# Patient Record
Sex: Female | Born: 1960 | Race: White | Hispanic: No | Marital: Single | Smoking: Current every day smoker
Health system: Southern US, Community
[De-identification: ages and names within clinical notes are randomized; demographics above are authoritative.]

## PROBLEM LIST (undated history)

## (undated) DIAGNOSIS — M5136 Other intervertebral disc degeneration, lumbar region: Secondary | ICD-10-CM

## (undated) DIAGNOSIS — M160 Bilateral primary osteoarthritis of hip: Secondary | ICD-10-CM

## (undated) DIAGNOSIS — I607 Nontraumatic subarachnoid hemorrhage from unspecified intracranial artery: Secondary | ICD-10-CM

## (undated) DIAGNOSIS — I10 Essential (primary) hypertension: Secondary | ICD-10-CM

## (undated) DIAGNOSIS — Z87891 Personal history of nicotine dependence: Secondary | ICD-10-CM

## (undated) DIAGNOSIS — H532 Diplopia: Secondary | ICD-10-CM

## (undated) DIAGNOSIS — E785 Hyperlipidemia, unspecified: Secondary | ICD-10-CM

## (undated) DIAGNOSIS — I671 Cerebral aneurysm, nonruptured: Secondary | ICD-10-CM

## (undated) DIAGNOSIS — J449 Chronic obstructive pulmonary disease, unspecified: Secondary | ICD-10-CM

## (undated) DIAGNOSIS — H21239 Degeneration of iris (pigmentary), unspecified eye: Secondary | ICD-10-CM

## (undated) HISTORY — PX: BRAIN SURGERY: SHX531

## (undated) HISTORY — DX: Nontraumatic subarachnoid hemorrhage from unspecified intracranial artery: I60.7

## (undated) HISTORY — DX: Chronic obstructive pulmonary disease, unspecified: J44.9

## (undated) HISTORY — DX: Personal history of nicotine dependence: Z87.891

## (undated) HISTORY — DX: Hyperlipidemia, unspecified: E78.5

## (undated) HISTORY — DX: Other intervertebral disc degeneration, lumbar region: M51.36

## (undated) HISTORY — PX: KIDNEY SURGERY: SHX687

## (undated) HISTORY — DX: Diplopia: H53.2

## (undated) HISTORY — DX: Bilateral primary osteoarthritis of hip: M16.0

## (undated) HISTORY — DX: Cerebral aneurysm, nonruptured: I67.1

## (undated) HISTORY — DX: Degeneration of iris (pigmentary), unspecified eye: H21.239

---

## 2009-04-16 ENCOUNTER — Ambulatory Visit: Payer: Self-pay | Admitting: Family Medicine

## 2009-04-16 DIAGNOSIS — N644 Mastodynia: Secondary | ICD-10-CM

## 2009-04-16 LAB — CONVERTED CEMR LAB
Basophils Absolute: 0 10*3/uL (ref 0.0–0.1)
Basophils Relative: 0 % (ref 0–1)
Eosinophils Relative: 0 % (ref 0–5)
Hemoglobin: 14 g/dL (ref 12.0–15.0)
MCHC: 35 g/dL (ref 30.0–36.0)
Monocytes Absolute: 0.5 10*3/uL (ref 0.1–1.0)
Neutro Abs: 5.8 10*3/uL (ref 1.7–7.7)
Platelets: 296 10*3/uL (ref 150–400)
RDW: 12.4 % (ref 11.5–15.5)

## 2009-04-17 ENCOUNTER — Encounter: Payer: Self-pay | Admitting: Family Medicine

## 2009-04-18 ENCOUNTER — Encounter: Admission: RE | Admit: 2009-04-18 | Discharge: 2009-04-18 | Payer: Self-pay | Admitting: Family Medicine

## 2009-04-19 LAB — CONVERTED CEMR LAB
CO2: 21 meq/L (ref 19–32)
Creatinine, Ser: 0.74 mg/dL (ref 0.40–1.20)
Glucose, Bld: 92 mg/dL (ref 70–99)
Sed Rate: 2 mm/hr (ref 0–22)
Total Bilirubin: 0.4 mg/dL (ref 0.3–1.2)

## 2014-08-13 ENCOUNTER — Encounter (HOSPITAL_COMMUNITY): Payer: Self-pay

## 2014-08-13 ENCOUNTER — Inpatient Hospital Stay (HOSPITAL_COMMUNITY)
Admission: EM | Admit: 2014-08-13 | Discharge: 2014-08-24 | DRG: 020 | Disposition: A | Discharge status: Rehabilitation Facility | Payer: Managed Care, Other (non HMO) | Attending: Neurosurgery | Admitting: Neurosurgery

## 2014-08-13 ENCOUNTER — Emergency Department (HOSPITAL_COMMUNITY): Payer: Managed Care, Other (non HMO)

## 2014-08-13 ENCOUNTER — Inpatient Hospital Stay (HOSPITAL_COMMUNITY): Payer: Managed Care, Other (non HMO)

## 2014-08-13 DIAGNOSIS — R0902 Hypoxemia: Secondary | ICD-10-CM | POA: Diagnosis present

## 2014-08-13 DIAGNOSIS — G9340 Encephalopathy, unspecified: Secondary | ICD-10-CM

## 2014-08-13 DIAGNOSIS — R519 Headache, unspecified: Secondary | ICD-10-CM

## 2014-08-13 DIAGNOSIS — I6359 Cerebral infarction due to unspecified occlusion or stenosis of other cerebral artery: Secondary | ICD-10-CM | POA: Diagnosis not present

## 2014-08-13 DIAGNOSIS — S066X0A Traumatic subarachnoid hemorrhage without loss of consciousness, initial encounter: Secondary | ICD-10-CM

## 2014-08-13 DIAGNOSIS — I609 Nontraumatic subarachnoid hemorrhage, unspecified: Secondary | ICD-10-CM | POA: Diagnosis present

## 2014-08-13 DIAGNOSIS — J811 Chronic pulmonary edema: Secondary | ICD-10-CM | POA: Insufficient documentation

## 2014-08-13 DIAGNOSIS — R51 Headache: Secondary | ICD-10-CM

## 2014-08-13 DIAGNOSIS — G910 Communicating hydrocephalus: Secondary | ICD-10-CM

## 2014-08-13 DIAGNOSIS — I607 Nontraumatic subarachnoid hemorrhage from unspecified intracranial artery: Secondary | ICD-10-CM | POA: Diagnosis present

## 2014-08-13 DIAGNOSIS — K5900 Constipation, unspecified: Secondary | ICD-10-CM | POA: Diagnosis not present

## 2014-08-13 DIAGNOSIS — E872 Acidosis: Secondary | ICD-10-CM | POA: Diagnosis not present

## 2014-08-13 DIAGNOSIS — I608 Other nontraumatic subarachnoid hemorrhage: Secondary | ICD-10-CM

## 2014-08-13 DIAGNOSIS — F17210 Nicotine dependence, cigarettes, uncomplicated: Secondary | ICD-10-CM | POA: Diagnosis present

## 2014-08-13 DIAGNOSIS — I10 Essential (primary) hypertension: Secondary | ICD-10-CM | POA: Diagnosis present

## 2014-08-13 DIAGNOSIS — Z8679 Personal history of other diseases of the circulatory system: Secondary | ICD-10-CM | POA: Diagnosis present

## 2014-08-13 DIAGNOSIS — H4921 Sixth [abducent] nerve palsy, right eye: Secondary | ICD-10-CM | POA: Diagnosis present

## 2014-08-13 DIAGNOSIS — E871 Hypo-osmolality and hyponatremia: Secondary | ICD-10-CM | POA: Diagnosis not present

## 2014-08-13 DIAGNOSIS — R451 Restlessness and agitation: Secondary | ICD-10-CM | POA: Diagnosis not present

## 2014-08-13 DIAGNOSIS — R0602 Shortness of breath: Secondary | ICD-10-CM

## 2014-08-13 DIAGNOSIS — J9601 Acute respiratory failure with hypoxia: Secondary | ICD-10-CM | POA: Diagnosis not present

## 2014-08-13 HISTORY — DX: Essential (primary) hypertension: I10

## 2014-08-13 LAB — COMPREHENSIVE METABOLIC PANEL
ALBUMIN: 3.8 g/dL (ref 3.5–5.2)
ALT: 17 U/L (ref 0–35)
ANION GAP: 17 — AB (ref 5–15)
AST: 31 U/L (ref 0–37)
Alkaline Phosphatase: 121 U/L — ABNORMAL HIGH (ref 39–117)
BUN: 8 mg/dL (ref 6–23)
CALCIUM: 8.9 mg/dL (ref 8.4–10.5)
CHLORIDE: 100 meq/L (ref 96–112)
CO2: 24 mEq/L (ref 19–32)
CREATININE: 0.63 mg/dL (ref 0.50–1.10)
GFR calc Af Amer: >90 mL/min (ref >90)
GFR calc non Af Amer: >90 mL/min (ref >90)
Glucose, Bld: 173 mg/dL — ABNORMAL HIGH (ref 70–99)
Potassium: 3.1 mEq/L — ABNORMAL LOW (ref 3.7–5.3)
Sodium: 141 mEq/L (ref 137–147)
TOTAL PROTEIN: 6.8 g/dL (ref 6.0–8.3)
Total Bilirubin: 0.3 mg/dL (ref 0.3–1.2)

## 2014-08-13 LAB — CBC WITH DIFFERENTIAL/PLATELET
BASOS ABS: 0 10*3/uL (ref 0.0–0.1)
BASOS PCT: 0 % (ref 0–1)
Eosinophils Absolute: 0 10*3/uL (ref 0.0–0.7)
Eosinophils Relative: 0 % (ref 0–5)
HEMATOCRIT: 38.7 % (ref 36.0–46.0)
HEMOGLOBIN: 13.2 g/dL (ref 12.0–15.0)
LYMPHS PCT: 25 % (ref 12–46)
Lymphs Abs: 2.2 10*3/uL (ref 0.7–4.0)
MCH: 34.9 pg — ABNORMAL HIGH (ref 26.0–34.0)
MCHC: 34.1 g/dL (ref 30.0–36.0)
MCV: 102.4 fL — ABNORMAL HIGH (ref 78.0–100.0)
MONO ABS: 0.4 10*3/uL (ref 0.1–1.0)
MONOS PCT: 4 % (ref 3–12)
NEUTROS ABS: 6.1 10*3/uL (ref 1.7–7.7)
NEUTROS PCT: 71 % (ref 43–77)
Platelets: 307 10*3/uL (ref 150–400)
RBC: 3.78 MIL/uL — AB (ref 3.87–5.11)
RDW: 13 % (ref 11.5–15.5)
WBC: 8.7 10*3/uL (ref 4.0–10.5)

## 2014-08-13 LAB — TROPONIN I

## 2014-08-13 LAB — APTT: aPTT: 23 seconds — ABNORMAL LOW (ref 24–37)

## 2014-08-13 LAB — TYPE AND SCREEN
ABO/RH(D): O POS
ANTIBODY SCREEN: NEGATIVE

## 2014-08-13 LAB — MRSA PCR SCREENING: MRSA by PCR: NEGATIVE

## 2014-08-13 LAB — ABO/RH: ABO/RH(D): O POS

## 2014-08-13 LAB — PROTIME-INR
INR: 1 (ref 0.00–1.49)
Prothrombin Time: 13.3 seconds (ref 11.6–15.2)

## 2014-08-13 MED ORDER — POTASSIUM CHLORIDE IN NACL 40-0.9 MEQ/L-% IV SOLN
INTRAVENOUS | Status: DC
  Administered 2014-08-13 – 2014-08-16 (×5): 125 mL/h via INTRAVENOUS
  Filled 2014-08-13 (×15): qty 1000

## 2014-08-13 MED ORDER — ONDANSETRON HCL 4 MG/2ML IJ SOLN
4.0000 mg | Freq: Once | INTRAMUSCULAR | Status: AC
  Administered 2014-08-13: 4 mg via INTRAVENOUS
  Filled 2014-08-13: qty 2

## 2014-08-13 MED ORDER — PANTOPRAZOLE SODIUM 40 MG IV SOLR
40.0000 mg | Freq: Every day | INTRAVENOUS | Status: DC
  Administered 2014-08-13 – 2014-08-14 (×2): 40 mg via INTRAVENOUS
  Filled 2014-08-13 (×4): qty 40

## 2014-08-13 MED ORDER — ATORVASTATIN CALCIUM 40 MG PO TABS
40.0000 mg | ORAL_TABLET | Freq: Every day | ORAL | Status: DC
  Administered 2014-08-13 – 2014-08-23 (×11): 40 mg via ORAL
  Filled 2014-08-13 (×13): qty 1

## 2014-08-13 MED ORDER — NIMODIPINE 60 MG/20ML PO SOLN
60.0000 mg | ORAL | Status: DC
  Administered 2014-08-13: 60 mg
  Filled 2014-08-13 (×36): qty 20

## 2014-08-13 MED ORDER — SODIUM CHLORIDE 0.9 % IV SOLN
INTRAVENOUS | Status: DC
  Administered 2014-08-13: 1000 mL via INTRAVENOUS
  Administered 2014-08-14 – 2014-08-19 (×6): via INTRAVENOUS
  Administered 2014-08-20: 100 mL/h via INTRAVENOUS
  Administered 2014-08-21 – 2014-08-23 (×4): via INTRAVENOUS
  Administered 2014-08-24: 100 mL/h via INTRAVENOUS

## 2014-08-13 MED ORDER — ONDANSETRON HCL 4 MG/2ML IJ SOLN
4.0000 mg | Freq: Four times a day (QID) | INTRAMUSCULAR | Status: DC | PRN
  Administered 2014-08-13: 4 mg via INTRAVENOUS
  Administered 2014-08-16: 8 mg via INTRAVENOUS
  Administered 2014-08-17: 4 mg via INTRAVENOUS
  Filled 2014-08-13: qty 4
  Filled 2014-08-13 (×2): qty 2
  Filled 2014-08-13: qty 4

## 2014-08-13 MED ORDER — FENTANYL CITRATE 0.05 MG/ML IJ SOLN
100.0000 ug | Freq: Once | INTRAMUSCULAR | Status: AC
Start: 2014-08-13 — End: 2014-08-13
  Administered 2014-08-13: 100 ug via INTRAVENOUS
  Filled 2014-08-13: qty 2

## 2014-08-13 MED ORDER — FENTANYL CITRATE 0.05 MG/ML IJ SOLN
50.0000 ug | Freq: Once | INTRAMUSCULAR | Status: AC
  Administered 2014-08-13: 50 ug via INTRAVENOUS
  Filled 2014-08-13: qty 2

## 2014-08-13 MED ORDER — HYDRALAZINE HCL 20 MG/ML IJ SOLN
5.0000 mg | INTRAMUSCULAR | Status: DC | PRN
  Administered 2014-08-13 – 2014-08-20 (×17): 10 mg via INTRAVENOUS
  Filled 2014-08-13 (×18): qty 1

## 2014-08-13 MED ORDER — IOHEXOL 350 MG/ML SOLN
50.0000 mL | Freq: Once | INTRAVENOUS | Status: AC | PRN
  Administered 2014-08-13: 50 mL via INTRAVENOUS

## 2014-08-13 MED ORDER — DEXAMETHASONE SODIUM PHOSPHATE 4 MG/ML IJ SOLN
4.0000 mg | Freq: Four times a day (QID) | INTRAMUSCULAR | Status: DC
  Administered 2014-08-13 – 2014-08-22 (×36): 4 mg via INTRAVENOUS
  Filled 2014-08-13 (×40): qty 1

## 2014-08-13 MED ORDER — NIMODIPINE 30 MG PO CAPS
60.0000 mg | ORAL_CAPSULE | ORAL | Status: DC
  Administered 2014-08-13 – 2014-08-18 (×26): 60 mg via ORAL
  Filled 2014-08-13 (×36): qty 2

## 2014-08-13 MED ORDER — ACETAMINOPHEN 650 MG RE SUPP: 650.0000 mg | RECTAL | Status: DC | PRN

## 2014-08-13 MED ORDER — ACETAMINOPHEN 325 MG PO TABS
650.0000 mg | ORAL_TABLET | ORAL | Status: DC | PRN
  Administered 2014-08-15 – 2014-08-16 (×2): 650 mg via ORAL
  Filled 2014-08-13 (×2): qty 2

## 2014-08-13 MED ORDER — MORPHINE SULFATE 4 MG/ML IJ SOLN
4.0000 mg | Freq: Once | INTRAMUSCULAR | Status: AC
  Administered 2014-08-13: 4 mg via INTRAVENOUS
  Filled 2014-08-13: qty 1

## 2014-08-13 MED ORDER — SIMVASTATIN 80 MG PO TABS
80.0000 mg | ORAL_TABLET | Freq: Every day | ORAL | Status: DC
Start: 2014-08-13 — End: 2014-08-13

## 2014-08-13 MED ORDER — MORPHINE SULFATE 2 MG/ML IJ SOLN
1.0000 mg | INTRAMUSCULAR | Status: DC | PRN
  Administered 2014-08-13 – 2014-08-15 (×11): 2 mg via INTRAVENOUS
  Administered 2014-08-15: 3 mg via INTRAVENOUS
  Administered 2014-08-15: 2 mg via INTRAVENOUS
  Administered 2014-08-16: 1 mg via INTRAVENOUS
  Administered 2014-08-17: 4 mg via INTRAVENOUS
  Administered 2014-08-21: 1 mg via INTRAVENOUS
  Administered 2014-08-21 – 2014-08-24 (×9): 2 mg via INTRAVENOUS
  Filled 2014-08-13 (×3): qty 1
  Filled 2014-08-13: qty 2
  Filled 2014-08-13 (×9): qty 1
  Filled 2014-08-13: qty 2
  Filled 2014-08-13 (×11): qty 1

## 2014-08-13 MED ORDER — SENNOSIDES-DOCUSATE SODIUM 8.6-50 MG PO TABS
1.0000 | ORAL_TABLET | Freq: Two times a day (BID) | ORAL | Status: DC
  Administered 2014-08-13 – 2014-08-17 (×5): 1 via ORAL
  Filled 2014-08-13 (×14): qty 1

## 2014-08-13 MED ORDER — LABETALOL HCL 5 MG/ML IV SOLN
5.0000 mg | INTRAVENOUS | Status: DC | PRN
  Administered 2014-08-13: 20 mg via INTRAVENOUS
  Administered 2014-08-15 – 2014-08-18 (×3): 10 mg via INTRAVENOUS
  Filled 2014-08-13 (×3): qty 4

## 2014-08-13 NOTE — Progress Notes (Signed)
Transcranial Doppler  Date POD PCO2 HCT BP  MCA ACA PCA OPHT SIPH VERT Basilar  08/13/14 JE     Right  Left   47 56 -70     38  34   20  22   58  23   -41  -20   -46           Right  Left                                            Right  Left                                             Right  Left                                             Right  Left                                            Right  Left                                            Right  Left                                        MCA = Middle Cerebral Artery      OPHT = Opthalmic Artery     BASILAR = Basilar Artery   ACA = Anterior Cerebral Artery     SIPH = Carotid Siphon PCA = Posterior Cerebral Artery   VERT = Verterbral Artery                   Normal MCA = 62+\-12 ACA = 50+\-12 PCA = 42+\-23

## 2014-08-13 NOTE — Progress Notes (Signed)
Pt seen and examined. History reviewed. Pt has hx of hypertension and smoking history. Also has strong family hx of intracranial aneurysms in father and sister.  EXAM:  BP 124/59 mmHg  Pulse 74  Temp(Src) 97.9 F (36.6 C) (Oral)  Resp 13  Ht 5\' 4"  (1.626 m)  Wt 58.8 kg (129 lb 10.1 oz)  BMI 22.24 kg/m2  SpO2 98%  Sleepy but easily arousable, oriented  Speech fluent, appropriate  CN grossly intact  5/5 BUE/BLE   IMAGING: CTH demonstrates diffuse basal SAH with minimal dependent IVH in occipital horns and no ventriculomegaly.  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 No other aneurysms are identified.  IMPRESSION:  53 y.o. 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 I explained to them that intracranial aneurysm was the most common non[facility]traumatic [facility] and that the definitive diagnosis is made by diagnostic angiogram. I also explained to them the possible treatment options for intracranial aneurysms including endovascular coiling and open clip ligation. The risks of the angiogram, coiling, and surgical clipping were also reviewed to include stroke and aneurysm re-rupture leading to weakness/paralysis/coma/death, infection, SZ, hydrocephalus. I also talked to them about the possibility of ventriculostomy for CSF drainage and the risks of this procedure.  The patient and her family understood our discussion and the provided consent to proceed with diagnostic angiogram and the appropriate treatment for any identified aneurysm.

## 2014-08-13 NOTE — H&P (Signed)
Subjective: Patient is a 53 y.o. female who is admitted for treatment of acute subarachnoid hemorrhage.  Patient has a history of hypertension, but does not take the medications prescribed for her. Patient was at work, talking to her coworkers, and developed sudden onset of severe headache and neck pain. [facility], and evaluated by Dr. Arby BarretteMarcy Pfeiffer, the EDP, who obtained a CT of the head which revealed a subarachnoid hemorrhage, and consulted neurosurgery.  Patient is complaining of severe headache and neck pain, and has been treated with repeated doses of morphine IV. She's also had nausea, has been treated with Zofran IV.  History is notable for her father having died of a ruptured cerebral aneurysm, and her sister having an aneurysm.  Past medical history: Notable for history of hypertension. She has been prescribed medication, but does not take it (she does not recall the name of the medication). She denies any history of myocardial infarction, cancer, stroke, peptic ulcers, lung disease, or cancer.  Past surgical history:  2 cesarean sections.   Patient Active Problem List   Diagnosis Date Noted  . Subarachnoid hemorrhage due to ruptured aneurysm 08/13/2014  . BREAST PAIN, LEFT 04/16/2009     (Not in a hospital admission) Allergies  Allergen Reactions  . Aspirin   . Ibuprofen   . Iodine   . Lobster [employer][Shellfish Allergy]     Family history:  Father died of a ruptured cerebral aneurysm, mother died of cancer, sister has a history of an aneurysm.  Social history: Patient is divorced, she's had a boyfriend, Jonny RuizJohn Dinardi, for many years, who was present at the bedside. She has 2 daughters. She smokes a pack a day and has been smoking for 35 years. She drinks alcohol beverages daily.  Review of Systems A comprehensive review of systems was negative except for: Those difficulties described in her history of present illness and past medical  history.  Objective: Vital signs in last 24 hours: Temp:  [94.5 F (34.7 C)-95 F (35 C)] 94.5 F (34.7 C) (12/07 1000) Pulse Rate:  [81-95] 81 (12/07 1030) Resp:  [11-23] 15 (12/07 1030) BP: (169-182)/(80-117) 182/87 mmHg (12/07 1030) SpO2:  [94 %-99 %] 99 % (12/07 1030) Weight:  [56.7 kg (125 lb)] 56.7 kg (125 lb) (12/07 0853)  EXAM: Patient is a well-developed, well-nourishe neck has d, white female in discomfort (both headache and neck pain), but who is in no acute distress. Lungs are clear to auscultation , the patient has symmetrical respiratory excursion. Heart has a regular rate and rhythm normal S1 and S2 no murmur.   Abdomen is soft nontender nondistended bowel sounds are present. Extremity examination shows no clubbing cyanosis or edema. 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 Mental status examination:  Patient is mildly drowsy, but easily aroused. She is oriented to her name, [facility]. She follows commands with all 4 extremities. Her speech is fluent. Cranial nerves examination: Pupils are 2.5 mm bilaterally, round, sluggishly reactive to light.  EOMI. Facial sensation intact. Facial movements symmetrical. Hearing present bilaterally. Palatal movements symmetrical. Shoulder shrug symmetrical. Tongue midline. Motor examination: Moving all 4 extremities with 5/5 strength. No drift of the upper extremities. Sensory examination: Intact to pinprick throughout the upper and lower extremities. Reflex examination: Symmetrical bilaterally in the upper and lower extremities. Toes downgoing bilaterally. Gait and stance: Not tested due to the nature the patient's condition.  Data Review:CBC    Component Value Date/Time  WBC 8.7 08/13/2014 0915   RBC 3.78* 08/13/2014 0915   HGB 13.2 08/13/2014 0915   HCT 38.7 08/13/2014 0915   PLT 307 08/13/2014 0915   MCV 102.4* 08/13/2014 0915   MCH 34.9* 08/13/2014 0915   MCHC 34.1 08/13/2014 0915   RDW 13.0  08/13/2014 0915   LYMPHSABS 2.2 08/13/2014 0915   MONOABS 0.4 08/13/2014 0915   EOSABS 0.0 08/13/2014 0915   BASOSABS 0.0 08/13/2014 0915                          BMET    Component Value Date/Time   NA 141 08/13/2014 0915   K 3.1* 08/13/2014 0915   CL 100 08/13/2014 0915   CO2 24 08/13/2014 0915   GLUCOSE 173* 08/13/2014 0915   BUN 8 08/13/2014 0915   CREATININE 0.63 08/13/2014 0915   CALCIUM 8.9 08/13/2014 0915   GFRNONAA >90 08/13/2014 0915   GFRAA >90 08/13/2014 0915     Assessment/Plan: Patient who presented to the emergency room today with an acute subarachnoid hemorrhage, probably aneurysmal. Currently grade 2-3.  Case discussed with Dr. Lisbeth RenshawNeelesh Nundkumar, our vascular and endovascular specialist.  We will admit patient to his service, and have initiated supportive IV fluids, Nimotop, Zocor, TCDs, and have requested a CT angiogram. Dr. Conchita ParisNundkumar will see the patient later today, and anticipates proceeding with cerebral angiography tomorrow morning. I spoke with the patient's boyfriend, Jonny RuizJohn Dinardi, and discussed the nature of aneurysmal subarachnoid hemorrhage, the risk of rehemorrhage from the aneurysm with the risk of increased morbidity and mortality, the risk of vasospasm and increased neurologic deficit, and the risk of hydrocephalus and possibly for placement of an intraventricular catheter. His questions regarding the situation were answered for him.   Hewitt ShortsNUDELMAN,ROBERT W, MD 08/13/2014 10:43 AM

## 2014-08-13 NOTE — ED Provider Notes (Signed)
CSN: [id number]     Arrival date & time 08/13/14  0844 History   First MD Initiated Contact with Patient 08/13/14 [phone]     Chief Complaint  Patient presents with  . Headache     (Consider location/radiation/quality/duration/timing/severity/associated sxs/prior Treatment) HPI The patient had a sudden onset of severe headache prior to arrival. She was at work and speaking with someone when she had sudden severe 10 out of 10 pain in the back of her head. She did not have a loss of consciousness. Coworkers eased her to a seated position. There was no injury associated with this episode. The patient reports she is very nauseated. She has never had similar type headache. She reports some distant migraine headache history but nothing similar to this. The patient was well this morning she got up her for work drove herself to work without symptoms. Past Medical History  Diagnosis Date  . Hypertension    Past Surgical History  Procedure Laterality Date  . Cessarian N/A   . Radiology with anesthesia N/A 08/14/2014    Procedure: Embolization   (RADIOLOGY WITH ANESTHESIA) ;  Surgeon: Medication Radiologist, MD;  Location: [facility] OR;  Service: Radiology;  Laterality: N/A;   History reviewed. No pertinent family history. History  Substance Use Topics  . Smoking status: Current Every Day Smoker -- 1.00 packs/day  . Smokeless tobacco: Not on file  . Alcohol Use: Not on file   OB History    No data available     Review of Systems 10 Systems reviewed and are negative for acute change except as noted in the HPI.    Allergies  Aspirin; Ibuprofen; Iodine; and Lobster  Home Medications   Prior to Admission medications   Medication Sig Start Date End Date Taking? Authorizing Provider  acetaminophen (TYLENOL) 325 MG tablet Take 650 mg by mouth every 6 (six) hours as needed for mild pain.   Yes Historical Provider, MD  pseudoephedrine-acetaminophen (TYLENOL SINUS) 30-500 MG TABS Take 1 tablet by mouth  every 4 (four) hours as needed (sinus).   Yes Historical Provider, MD   BP 151/81 mmHg  Pulse 55  Temp(Src) 98.3 F (36.8 C) (Oral)  Resp 19  Ht 5\' 4"  (1.626 m)  Wt 129 lb 10.1 oz (58.8 kg)  BMI 22.24 kg/m2  SpO2 96% Physical Exam  Constitutional: She is oriented to person, place, and time. She appears well-developed and well-nourished.  Patient appears to be in very severe pain. Her mental status is alert she is in no respiratory distress.  HENT:  Head: Normocephalic and atraumatic.  Eyes: Conjunctivae and EOM are normal. Pupils are equal, round, and reactive to light.  Neck: Neck supple.  Cardiovascular: Normal rate, regular rhythm, normal heart sounds and intact distal pulses.   Pulmonary/Chest: Effort normal and breath sounds normal. No respiratory distress. She has no wheezes. She has no rales.  Abdominal: Soft. She exhibits no distension. There is no tenderness.  Musculoskeletal: Normal range of motion. She exhibits no edema or tenderness.  Neurological: She is alert and oriented to person, place, and time. No cranial nerve deficit.  Patient has bilateral 5 out of 5 grip strength. She has 5 out of 5 plantar extension/ flexion strength. She is following commands appropriately.  Skin: Skin is warm. No pallor.  Patient is mildly diaphoretic.  Psychiatric:  Patient is moderately agitated   Recheck 9:15, GCS 15. Patient still has pain 10 out of 10. Follows commands appropriately, speech is oriented. Recheck 09:45, GCS 15.  ED Course  Procedures (including critical care time) Labs Review Labs Reviewed  COMPREHENSIVE METABOLIC PANEL - Abnormal; Notable for the following:    Potassium 3.1 (*)    Glucose, Bld 173 (*)    Alkaline Phosphatase 121 (*)    Anion gap 17 (*)    All other components within normal limits  CBC WITH DIFFERENTIAL - Abnormal; Notable for the following:    RBC 3.78 (*)    MCV 102.4 (*)    MCH 34.9 (*)    All other components within normal limits   URINALYSIS, ROUTINE W REFLEX MICROSCOPIC - Abnormal; Notable for the following:    Ketones, ur 15 (*)    All other components within normal limits  APTT - Abnormal; Notable for the following:    aPTT 23 (*)    All other components within normal limits  BASIC METABOLIC PANEL - Abnormal; Notable for the following:    CO2 18 (*)    Glucose, Bld 153 (*)    Anion gap 18 (*)    All other components within normal limits  MRSA PCR SCREENING  TROPONIN I  PROTIME-INR  TYPE AND SCREEN  ABO/RH    Imaging Review No results found.   EKG Interpretation None     Consult:Neurosurg return call at 09:38. Will see in the emergency department. Continue current management. CRITICAL CARE Performed by: Arby BarrettePfeiffer, Sharry Beining   Total critical care time: 40  Critical care time was exclusive of separately billable procedures and treating other patients.  Critical care was necessary to treat or prevent imminent or life-threatening deterioration.  Critical care was time spent personally by me on the following activities: development of treatment plan with patient and/or surrogate as well as nursing, discussions with consultants, evaluation of patient's response to treatment, examination of patient, obtaining history from patient or surrogate, ordering and performing treatments and interventions, ordering and review of laboratory studies, ordering and review of radiographic studies, pulse oximetry and re-evaluation of patient's condition. MDM   Final diagnoses:  Headache  Subarachnoid hemorrhage due to ruptured aneurysm  SAH (subarachnoid hemorrhage)   Patient presents with acute onset of headache and identified subarachnoid hemorrhage. To this point GCS has remained 15 and neurosurgery will evaluate in the emergency department for definitive management.    Arby BarretteMarcy Kenshawn Maciolek, MD 08/15/14 [phone]

## 2014-08-13 NOTE — ED Notes (Signed)
Pt arrives via EMs from workplace. Pt has been having several days of neck pain. Today with sudden onset of severe headache. Pt with eyes closed, moaning in pain. Nauseated but no vomiting. Pt oriented x4, hypertensive. 20g LAC, 4mg  zofran.

## 2014-08-14 ENCOUNTER — Inpatient Hospital Stay (HOSPITAL_COMMUNITY): Payer: Managed Care, Other (non HMO) | Admitting: Anesthesiology

## 2014-08-14 ENCOUNTER — Inpatient Hospital Stay (HOSPITAL_COMMUNITY): Payer: Managed Care, Other (non HMO)

## 2014-08-14 ENCOUNTER — Encounter (HOSPITAL_COMMUNITY)
Admission: EM | Disposition: A | Discharge status: Rehabilitation Facility | Payer: Self-pay | Source: Home / Self Care | Attending: Neurosurgery

## 2014-08-14 ENCOUNTER — Encounter (HOSPITAL_COMMUNITY): Payer: Self-pay | Admitting: Anesthesiology

## 2014-08-14 HISTORY — PX: RADIOLOGY WITH ANESTHESIA: SHX6223

## 2014-08-14 LAB — BASIC METABOLIC PANEL
Anion gap: 18 — ABNORMAL HIGH (ref 5–15)
BUN: 8 mg/dL (ref 6–23)
CO2: 18 mEq/L — ABNORMAL LOW (ref 19–32)
Calcium: 9.4 mg/dL (ref 8.4–10.5)
Chloride: 104 mEq/L (ref 96–112)
Creatinine, Ser: 0.62 mg/dL (ref 0.50–1.10)
GFR calc Af Amer: >90 mL/min (ref >90)
GFR calc non Af Amer: >90 mL/min (ref >90)
Glucose, Bld: 153 mg/dL — ABNORMAL HIGH (ref 70–99)
Potassium: 4.3 mEq/L (ref 3.7–5.3)
Sodium: 140 mEq/L (ref 137–147)

## 2014-08-14 LAB — URINALYSIS, ROUTINE W REFLEX MICROSCOPIC
Bilirubin Urine: NEGATIVE
GLUCOSE, UA: NEGATIVE mg/dL
HGB URINE DIPSTICK: NEGATIVE
KETONES UR: 15 mg/dL — AB
LEUKOCYTES UA: NEGATIVE
NITRITE: NEGATIVE
PROTEIN: NEGATIVE mg/dL
Specific Gravity, Urine: 1.018 (ref 1.005–1.030)
Urobilinogen, UA: 0.2 mg/dL (ref 0.0–1.0)
pH: 5 (ref 5.0–8.0)

## 2014-08-14 SURGERY — RADIOLOGY WITH ANESTHESIA
Anesthesia: General

## 2014-08-14 MED ORDER — HEPARIN SODIUM (PORCINE) 1000 UNIT/ML IJ SOLN
INTRAMUSCULAR | Status: AC
  Filled 2014-08-14: qty 1

## 2014-08-14 MED ORDER — PROMETHAZINE HCL 25 MG/ML IJ SOLN: 6.2500 mg | INTRAMUSCULAR | Status: DC | PRN

## 2014-08-14 MED ORDER — MEPERIDINE HCL 25 MG/ML IJ SOLN: 6.2500 mg | INTRAMUSCULAR | Status: DC | PRN

## 2014-08-14 MED ORDER — SODIUM CHLORIDE 0.9 % IV SOLN
10.0000 mg | INTRAVENOUS | Status: DC | PRN
  Administered 2014-08-14: 25 ug/min via INTRAVENOUS

## 2014-08-14 MED ORDER — CHLORHEXIDINE GLUCONATE 4 % EX LIQD
CUTANEOUS | Status: AC
  Filled 2014-08-14: qty 15

## 2014-08-14 MED ORDER — VECURONIUM BROMIDE 10 MG IV SOLR
INTRAVENOUS | Status: DC | PRN
  Administered 2014-08-14: 2 mg via INTRAVENOUS

## 2014-08-14 MED ORDER — ROCURONIUM BROMIDE 100 MG/10ML IV SOLN
INTRAVENOUS | Status: DC | PRN
  Administered 2014-08-14: 50 mg via INTRAVENOUS

## 2014-08-14 MED ORDER — LIDOCAINE HCL 1 % IJ SOLN
INTRAMUSCULAR | Status: AC
  Filled 2014-08-14: qty 20

## 2014-08-14 MED ORDER — GLYCOPYRROLATE 0.2 MG/ML IJ SOLN
INTRAMUSCULAR | Status: DC | PRN
  Administered 2014-08-14: 0.4 mg via INTRAVENOUS
  Administered 2014-08-14: 0.2 mg via INTRAVENOUS

## 2014-08-14 MED ORDER — ARTIFICIAL TEARS OP OINT
TOPICAL_OINTMENT | OPHTHALMIC | Status: DC | PRN
  Administered 2014-08-14: 1 via OPHTHALMIC

## 2014-08-14 MED ORDER — FENTANYL CITRATE 0.05 MG/ML IJ SOLN
INTRAMUSCULAR | Status: DC | PRN
  Administered 2014-08-14 (×2): 50 ug via INTRAVENOUS
  Administered 2014-08-14: 150 ug via INTRAVENOUS

## 2014-08-14 MED ORDER — LIDOCAINE HCL (CARDIAC) 20 MG/ML IV SOLN
INTRAVENOUS | Status: DC | PRN
  Administered 2014-08-14: 100 mg via INTRAVENOUS

## 2014-08-14 MED ORDER — CEFAZOLIN SODIUM-DEXTROSE 2-3 GM-% IV SOLR
INTRAVENOUS | Status: AC
  Administered 2014-08-14: 13:00:00
  Filled 2014-08-14: qty 50

## 2014-08-14 MED ORDER — FENTANYL CITRATE 0.05 MG/ML IJ SOLN: 25.0000 ug | INTRAMUSCULAR | Status: DC | PRN

## 2014-08-14 MED ORDER — IOHEXOL 300 MG/ML  SOLN
150.0000 mL | Freq: Once | INTRAMUSCULAR | Status: AC | PRN
  Administered 2014-08-14: 150 mL via INTRAVENOUS

## 2014-08-14 MED ORDER — CEFAZOLIN SODIUM-DEXTROSE 2-3 GM-% IV SOLR
INTRAVENOUS | Status: DC | PRN
  Administered 2014-08-14: 2 g via INTRAVENOUS

## 2014-08-14 MED ORDER — PROPOFOL 10 MG/ML IV BOLUS
INTRAVENOUS | Status: DC | PRN
  Administered 2014-08-14: 125 mg via INTRAVENOUS

## 2014-08-14 MED ORDER — ONDANSETRON HCL 4 MG/2ML IJ SOLN
INTRAMUSCULAR | Status: DC | PRN
  Administered 2014-08-14: 4 mg via INTRAVENOUS

## 2014-08-14 MED ORDER — LABETALOL HCL 5 MG/ML IV SOLN
INTRAVENOUS | Status: DC | PRN
  Administered 2014-08-14: 10 mg via INTRAVENOUS

## 2014-08-14 MED ORDER — NEOSTIGMINE METHYLSULFATE 10 MG/10ML IV SOLN
INTRAVENOUS | Status: DC | PRN
  Administered 2014-08-14: 3 mg via INTRAVENOUS

## 2014-08-14 MED ORDER — SODIUM CHLORIDE 0.9 % IV SOLN
INTRAVENOUS | Status: DC | PRN
  Administered 2014-08-14 (×3): via INTRAVENOUS

## 2014-08-14 NOTE — Progress Notes (Signed)
No issues overnight. Pt does still have HA, photophobia  EXAM:  BP 184/69 mmHg  Pulse 61  Temp(Src) 98.8 F (37.1 C) (Oral)  Resp 14  Ht 5\' 4"  (1.626 m)  Wt 58.8 kg (129 lb 10.1 oz)  BMI 22.24 kg/m2  SpO2 97%  Awake, alert, oriented  Speech fluent, appropriate  CN grossly intact  5/5 BUE/BLE   IMPRESSION:  53 y.o. female SAH d# 1, awaiting definitive diagnosis/treatment of aneurysm - Remains neurologically stable  PLAN: - Diagnostic angiogram, treatment of aneurysm. Awaiting IR room availability, currently being used for ischemic stroke intervention

## 2014-08-14 NOTE — Anesthesia Postprocedure Evaluation (Signed)
  Anesthesia Post-op Note  Patient: Samara DeistSharon R Veneziano  Procedure(s) Performed: Procedure(s): Embolization   (RADIOLOGY WITH ANESTHESIA)  (N/A)  Patient Location: PACU  Anesthesia Type:General  Level of Consciousness: awake  Airway and Oxygen Therapy: Patient Spontanous Breathing  Post-op Pain: mild  Post-op Assessment: Post-op Vital signs reviewed  Post-op Vital Signs: Reviewed  Last Vitals:  Filed Vitals:   08/14/14 1700  BP: 123/61  Pulse: 58  Temp:   Resp: 15    Complications: No apparent anesthesia complications

## 2014-08-14 NOTE — Transfer of Care (Signed)
Immediate Anesthesia Transfer of Care Note  Patient: Samara DeistSharon R Chovanec  Procedure(s) Performed: Procedure(s): Embolization   (RADIOLOGY WITH ANESTHESIA)  (N/A)  Patient Location: ICU  Anesthesia Type:General  Level of Consciousness: awake and alert   Airway & Oxygen Therapy: Patient Spontanous Breathing and Patient connected to nasal cannula oxygen  Post-op Assessment: Post -op Vital signs reviewed and stable and icu rn report given  Post vital signs: Reviewed and stable  Complications: No apparent anesthesia complications

## 2014-08-14 NOTE — Progress Notes (Signed)
Pt. Pedal pulses strong- DP 3t, PT 2t,  prior to cerebral intervention

## 2014-08-14 NOTE — Anesthesia Preprocedure Evaluation (Addendum)
Anesthesia Evaluation  Patient identified by MRN, date of birth, ID band Patient awake    Reviewed: Allergy & Precautions, H&P , NPO status , Patient's Chart, lab work & pertinent test results  Airway Mallampati: II  TM Distance: >3 FB Neck ROM: full    Dental  (+) Teeth Intact, Dental Advidsory Given   Pulmonary Current Smoker (1 pack per day),          Cardiovascular hypertension, Pt. on medications Rhythm:regular     Neuro/Psych Aneurysm bleed    GI/Hepatic GERD-  Medicated and Controlled,  Endo/Other    Renal/GU      Musculoskeletal   Abdominal   Peds  Hematology   Anesthesia Other Findings Neuro.  Grips Equal and strong, Eyes midline, Tongue midline, Alert and oriented to person, time, and place.  Reproductive/Obstetrics                            Anesthesia Physical Anesthesia Plan  ASA: III  Anesthesia Plan: General   Post-op Pain Management:    Induction: Intravenous  Airway Management Planned: LMA and Oral ETT  Additional Equipment:   Intra-op Plan:   Post-operative Plan: Extubation in OR  Informed Consent: I have reviewed the patients History and Physical, chart, labs and discussed the procedure including the risks, benefits and alternatives for the proposed anesthesia with the patient or authorized representative who has indicated his/her understanding and acceptance.   Dental Advisory Given  Plan Discussed with: Anesthesiologist, CRNA and Surgeon  Anesthesia Plan Comments: (Art line)       Anesthesia Quick Evaluation

## 2014-08-14 NOTE — Progress Notes (Signed)
Pt transported with RN to pre-op for IR proc.

## 2014-08-14 NOTE — Progress Notes (Signed)
Right femoral 8 fr sheath discontinued by Marinus MawBecky Fry, RT.  Pressure being held.  Right leg discoloration compared to left leg noted.  Right toes very red, leg purplish pink compared to left leg during manual pressure.  DP pulse +2.  Dr Conchita ParisNundkumar in to evaluate leg.  After manual pressure released leg color has returned to normal.

## 2014-08-14 NOTE — Brief Op Note (Signed)
PREOP DX: SAH  POSTOP DX: Same  PROCEDURE: Diagnostic cerebral angiogram  SURGEON: Dr. Lisbeth RenshawNeelesh Matricia Begnaud, MD  ANESTHESIA: GETA  EBL: Minimal  SPECIMENS: None  COMPLICATIONS: None  CONDITION: Stable to recovery  FINDINGS: 1. Small ~2x722mm aneurysm incorporating the origin of the right PICA successfully coiled. There is minimal neck residual. The right PICA remains widely patent 2. No other aneurysms/AVM/fistulas. 3. No vasospasm seen

## 2014-08-15 ENCOUNTER — Encounter (HOSPITAL_COMMUNITY): Payer: Self-pay | Admitting: Radiology

## 2014-08-15 DIAGNOSIS — I607 Nontraumatic subarachnoid hemorrhage from unspecified intracranial artery: Secondary | ICD-10-CM | POA: Diagnosis present

## 2014-08-15 MED ORDER — PANTOPRAZOLE SODIUM 40 MG PO TBEC
40.0000 mg | DELAYED_RELEASE_TABLET | Freq: Every day | ORAL | Status: DC
  Administered 2014-08-16 – 2014-08-17 (×2): 40 mg via ORAL
  Filled 2014-08-15 (×3): qty 1

## 2014-08-15 MED ORDER — HYDROMORPHONE HCL 1 MG/ML IJ SOLN
0.5000 mg | INTRAMUSCULAR | Status: DC | PRN
  Administered 2014-08-15 – 2014-08-16 (×7): 1 mg via INTRAVENOUS
  Administered 2014-08-16: 0.5 mg via INTRAVENOUS
  Administered 2014-08-16 – 2014-08-17 (×6): 1 mg via INTRAVENOUS
  Filled 2014-08-15 (×16): qty 1

## 2014-08-15 NOTE — Plan of Care (Signed)
Problem: Acute Treatment Outcomes Goal: Neuro exam at baseline or improved Outcome: Completed/Met Date Met:  08/15/14 Goal: Airway maintained/protected Outcome: Completed/Met Date Met:  08/15/14 Goal: 02 Sats > 94% Outcome: Progressing Goal: Nausea and vomiting controlled Outcome: Not Applicable Date Met:  30/07/62

## 2014-08-15 NOTE — Plan of Care (Signed)
Problem: Consults Goal: Intracranial Hemorrhage Patient Education See Patient Education Module for education specifics.  Outcome: Completed/Met Date Met:  08/15/14 Goal: Skin Care Protocol Initiated - if Braden Score 18 or less If consults are not indicated, leave blank or document N/A  Outcome: Not Applicable Date Met:  29/03/79 Goal: Nutrition Consult-if indicated Outcome: Not Applicable Date Met:  55/83/16 Goal: Diabetes Guidelines if Diabetic/Glucose > 140 If diabetic or lab glucose is > 140 mg/dl - Initiate Diabetes/Hyperglycemia Guidelines & Document Interventions  Outcome: Not Applicable Date Met:  74/25/52  Problem: Acute Treatment Outcomes Goal: BP within ordered parameters Outcome: Progressing Goal: 02 Sats > 94% Outcome: Completed/Met Date Met:  08/15/14 Goal: Prognosis discussed with family/patient as appropriate Outcome: Completed/Met Date Met:  08/15/14

## 2014-08-15 NOTE — Progress Notes (Signed)
Pt seen and examined. No issues overnight. Pt cont to have HA and neck stiffness. Otherwise no complaints. Vision unchanged, no N/T/W.  EXAM: Temp:  [97.8 F (36.6 C)-98.2 F (36.8 C)] 97.8 F (36.6 C) (12/09 0800) Pulse Rate:  [50-132] 68 (12/09 0800) Resp:  [10-24] 13 (12/09 0800) BP: (123-184)/(52-81) 166/80 mmHg (12/09 0801) SpO2:  [91 %-100 %] 94 % (12/09 0800) Arterial Line BP: (152-200)/(58-79) 200/77 mmHg (12/09 0800) Intake/Output      12/08 0701 - 12/09 0700 12/09 0701 - 12/10 0700   P.O.  100   I.V. (mL/kg) 4140 (70.4) 250 (4.3)   Total Intake(mL/kg) 4140 (70.4) 350 (6)   Urine (mL/kg/hr) 2675 (1.9)    Blood 100 (0.1)    Total Output 2775     Net +1365 +350         Awake, alert, oriented Speech fluent  CN Intact Good strength throughout Groin soft  LABS: Lab Results  Component Value Date   CREATININE 0.62 08/14/2014   BUN 8 08/14/2014   NA 140 08/14/2014   K 4.3 08/14/2014   CL 104 08/14/2014   CO2 18* 08/14/2014   Lab Results  Component Value Date   WBC 8.7 08/13/2014   HGB 13.2 08/13/2014   HCT 38.7 08/13/2014   MCV 102.4* 08/13/2014   PLT 307 08/13/2014    IMPRESSION: - 53 y.o. female SAH d# 2s/p RPICA aneurysm coiling, neurologically intact  PLAN: - Cont Nimotop/Zocor, Observe for spasm - Cont decadron for now

## 2014-08-15 NOTE — Progress Notes (Signed)
UR completed.  Siobhan Zaro, RN BSN [facility] CCM Trauma/Neuro ICU Case Manager [phone]  

## 2014-08-15 NOTE — Progress Notes (Addendum)
Transcranial Doppler  Date POD PCO2 HCT BP  MCA ACA PCA OPHT SIPH VERT Basilar  08/13/14 JE     Right  Left   47 56 -70     38  34   20  22   58  23   -41  -20   -46      12-9- 15 SB     Right  Left   90  63   -113  -47   73  --   17  22   41/39  61   -34  -27     -78    08-17-2014 VS     Right  Left   65  62   69  -37   -68  45   30  32   33  32   -46  -45     -63          Right  Left                                             Right  Left                                            Right  Left                                            Right  Left                                        MCA = Middle Cerebral Artery      OPHT = Opthalmic Artery     BASILAR = Basilar Artery   ACA = Anterior Cerebral Artery     SIPH = Carotid Siphon PCA = Posterior Cerebral Artery   VERT = Verterbral Artery                   Normal MCA = 62+\-12 ACA = 50+\-12 PCA = 42+\-23

## 2014-08-16 ENCOUNTER — Inpatient Hospital Stay (HOSPITAL_COMMUNITY): Payer: Managed Care, Other (non HMO)

## 2014-08-16 DIAGNOSIS — R0602 Shortness of breath: Secondary | ICD-10-CM

## 2014-08-16 DIAGNOSIS — I609 Nontraumatic subarachnoid hemorrhage, unspecified: Principal | ICD-10-CM

## 2014-08-16 DIAGNOSIS — R0902 Hypoxemia: Secondary | ICD-10-CM

## 2014-08-16 LAB — BASIC METABOLIC PANEL
Anion gap: 14 (ref 5–15)
BUN: 9 mg/dL (ref 6–23)
CO2: 20 meq/L (ref 19–32)
CREATININE: 0.41 mg/dL — AB (ref 0.50–1.10)
Calcium: 8.6 mg/dL (ref 8.4–10.5)
Chloride: 96 mEq/L (ref 96–112)
GFR calc Af Amer: >90 mL/min (ref >90)
GFR calc non Af Amer: >90 mL/min (ref >90)
Glucose, Bld: 150 mg/dL — ABNORMAL HIGH (ref 70–99)
POTASSIUM: 4.5 meq/L (ref 3.7–5.3)
Sodium: 130 mEq/L — ABNORMAL LOW (ref 137–147)

## 2014-08-16 LAB — BLOOD GAS, ARTERIAL
Acid-base deficit: 3.1 mmol/L — ABNORMAL HIGH (ref 0.0–2.0)
Bicarbonate: 20.4 mEq/L (ref 20.0–24.0)
Drawn by: 10552
FIO2: 0.55 %
O2 Saturation: 88.6 %
PCO2 ART: 30.5 mmHg — AB (ref 35.0–45.0)
PO2 ART: 54.2 mmHg — AB (ref 80.0–100.0)
Patient temperature: 98.6
TCO2: 21.3 mmol/L (ref 0–100)
pH, Arterial: 7.44 (ref 7.350–7.450)

## 2014-08-16 LAB — HEPATIC FUNCTION PANEL
ALT: 20 U/L (ref 0–35)
AST: 45 U/L — ABNORMAL HIGH (ref 0–37)
Albumin: 3 g/dL — ABNORMAL LOW (ref 3.5–5.2)
Alkaline Phosphatase: 114 U/L (ref 39–117)
Bilirubin, Direct: <0.2 mg/dL (ref 0.0–0.3)
TOTAL PROTEIN: 6.1 g/dL (ref 6.0–8.3)
Total Bilirubin: 0.8 mg/dL (ref 0.3–1.2)

## 2014-08-16 LAB — CBC
HEMATOCRIT: 34.3 % — AB (ref 36.0–46.0)
Hemoglobin: 11.8 g/dL — ABNORMAL LOW (ref 12.0–15.0)
MCH: 35.4 pg — ABNORMAL HIGH (ref 26.0–34.0)
MCHC: 34.4 g/dL (ref 30.0–36.0)
MCV: 103 fL — AB (ref 78.0–100.0)
Platelets: 192 10*3/uL (ref 150–400)
RBC: 3.33 MIL/uL — ABNORMAL LOW (ref 3.87–5.11)
RDW: 13 % (ref 11.5–15.5)
WBC: 17.4 10*3/uL — AB (ref 4.0–10.5)

## 2014-08-16 LAB — LACTIC ACID, PLASMA: Lactic Acid, Venous: 2.7 mmol/L — ABNORMAL HIGH (ref 0.5–2.2)

## 2014-08-16 LAB — PRO B NATRIURETIC PEPTIDE: Pro B Natriuretic peptide (BNP): 1286 pg/mL — ABNORMAL HIGH (ref 0–125)

## 2014-08-16 LAB — PHOSPHORUS: Phosphorus: 1.9 mg/dL — ABNORMAL LOW (ref 2.3–4.6)

## 2014-08-16 LAB — TROPONIN I: Troponin I: <0.3 ng/mL (ref <0.30)

## 2014-08-16 LAB — MAGNESIUM: MAGNESIUM: 1.7 mg/dL (ref 1.5–2.5)

## 2014-08-16 MED ORDER — BISACODYL 10 MG RE SUPP: 10.0000 mg | Freq: Every day | RECTAL | Status: DC | PRN

## 2014-08-16 MED ORDER — ALBUTEROL SULFATE (2.5 MG/3ML) 0.083% IN NEBU: 2.5000 mg | INHALATION_SOLUTION | RESPIRATORY_TRACT | Status: DC | PRN

## 2014-08-16 NOTE — Progress Notes (Signed)
Pt c/o arm pain and requested bp cuff be removed. Pain meds given. Will continue to monitor.

## 2014-08-16 NOTE — Consult Note (Signed)
PULMONARY / CRITICAL CARE MEDICINE   Name: Gabrielle DeistSharon R Fouch MRN: [id number] DOB: May 15, 1961    ADMISSION DATE:  08/13/2014 CONSULTATION DATE:  08/13/14   REFERRING MD :  Dr Conchita ParisNundkumar, NSGY  CHIEF COMPLAINT:  hypoxemia  INITIAL PRESENTATION: 53 yo woman, active smoker, hx HTN, [facility] 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  Reportedly well post procedure, evolved hypoxemia w B interstitial infiltrates 12/10 requiring 1.00 NRB mask. PCCM asked to evaluate.   STUDIES:  CT head 12/7 >> large SAH basilar cisterns, no hydrocephalus  CT angio head 12/7 >> 2mm R PICA aneurysm, no significant change in size of the SAH  SIGNIFICANT EVENTS: R PICA coiling 12/8 Acute hypoxemia with B infiltrates 12/10   HISTORY OF PRESENT ILLNESS:  53 yo woman, active smoker, hx HTN, [facility] 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  Reportedly well post procedure, evolved hypoxemia w B interstitial infiltrates 12/10 requiring 1.00 NRB mask. No identified trigger - no aspiration events reported, her BP has been elevated at 2-3 discrete times including am 12/10 to 148/119, better after pain control. Her main complaint is constipation and a HA.   PAST MEDICAL HISTORY :   has a past medical history of Hypertension.  has past surgical history that includes cessarian (N/A) and Radiology with anesthesia (N/A, 08/14/2014). Prior to Admission medications   Medication Sig Start Date End Date Taking? Authorizing Provider  acetaminophen (TYLENOL) 325 MG tablet Take 650 mg by mouth every 6 (six) hours as needed for mild pain.   Yes Historical Provider, MD  pseudoephedrine-acetaminophen (TYLENOL SINUS) 30-500 MG TABS Take 1 tablet by mouth every 4 (four) hours as needed (sinus).   Yes Historical Provider, MD   Allergies  Allergen Reactions  . Aspirin   . Ibuprofen   . Iodine   . Lobster [Shellfish Allergy]     FAMILY HISTORY:  has no family status  information on file.  SOCIAL HISTORY:  reports that she has been smoking.  She does not have any smokeless tobacco history on file.  REVIEW OF SYSTEMS:  Abdominal pain, constipation, HA, dyspnea.   SUBJECTIVE:  Uncomfortable, constipated  VITAL SIGNS: Temp:  [97.4 F (36.3 C)-98.2 F (36.8 C)] 97.4 F (36.3 C) (12/10 1600) Pulse Rate:  [47-95] 61 (12/10 1700) Resp:  [14-21] 18 (12/10 1700) BP: (140-231)/(59-187) 143/60 mmHg (12/10 1700) SpO2:  [83 %-96 %] 91 % (12/10 1700) FiO2 (%):  [31 %-55 %] 55 % (12/10 1611) HEMODYNAMICS:   VENTILATOR SETTINGS: Vent Mode:  [-]  FiO2 (%):  [31 %-55 %] 55 % INTAKE / OUTPUT:  Intake/Output Summary (Last 24 hours) at 08/16/14 1802 Last data filed at 08/16/14 1600  Gross per 24 hour  Intake   2750 ml  Output   1100 ml  Net   1650 ml    PHYSICAL EXAMINATION: General:  Obese woman, no distress but clearly uncomfortable.  Neuro:  Awake, oriented, moves all ext, non-focal HEENT:  OP clear, no stridor Cardiovascular:  Regular, no M Lungs:  decreased at B bases, no wheeze Abdomen:  Soft, mildly diffusely tender Musculoskeletal:  No edema Skin:  No rash  LABS:  CBC  Recent Labs Lab 08/13/14 0915  WBC 8.7  HGB 13.2  HCT 38.7  PLT 307   Coag's  Recent Labs Lab 08/13/14 0915  APTT 23*  INR 1.00   BMET  Recent Labs Lab 08/13/14 0915 08/14/14 0025  NA 141 140  K 3.1* 4.3  CL 100 104  CO2 24 18*  BUN 8 8  CREATININE 0.63 0.62  GLUCOSE 173* 153*   Electrolytes  Recent Labs Lab 08/13/14 0915 08/14/14 0025  CALCIUM 8.9 9.4   Sepsis Markers No results for input(s): LATICACIDVEN, PROCALCITON, O2SATVEN in the last 168 hours. ABG  Recent Labs Lab 08/16/14 1635  PHART 7.440  PCO2ART 30.5*  PO2ART 54.2*   Liver Enzymes  Recent Labs Lab 08/13/14 0915  AST 31  ALT 17  ALKPHOS 121*  BILITOT 0.3  ALBUMIN 3.8   Cardiac Enzymes  Recent Labs Lab 08/13/14 0915  TROPONINI <0.30   Glucose No results  for input(s): GLUCAP in the last 168 hours.  Imaging No results found.   ASSESSMENT / PLAN:  PULMONARY A: Acute hypoxemic respiratory failure, paO2 54 on 0.55 mask  B interstitial infiltrates most consistent with cardiogenic pulm edema, consider also non-cardiac causes, ALI, other pneumonitis. Least likely infectious process. No reported aspiration event, no infectious prodrome. She has had periods of severe hypertension, suspect diastolic dysfxn Hx tobacco use P:   - stable currently on 1.00 mask; does not appear to need BiPAP or MV at this time. If this is cardiogenic edema then goal to diurese and avoid requiring that type of support - smaller factor here is her abdominal compliance, constipation. Treating this should help her resp status - start albuterol prn given her tobacco hx  CARDIOVASCULAR A: HTN Suspected diastolic dysfunction P:  - check ECG and troponin - TTE ordered - tight BP control, labetalol and hydralazine ordered for goal SBP < 160 - would like to consider gentle diuresis but note treating to avoid vasospasm. At this time I will KVO her IVF but will not give diuretics until discuss with Dr Venetia MaxonStern  RENAL A:  Mild AG acidosis (AG 22).  P:   - check lactate now  GASTROINTESTINAL A:  Constipation P:   - dulcolax, enema, colace all ordered - PPI for acid prophylaxis  HEMATOLOGIC A:  No acute issues P:   INFECTIOUS A:  B infiltrates, low clinical suspicion for PNA but must consider.  P:   - no abx at this time - would treat empirically if she spikes temp, develops cough, sputum, decompensates in any way  ENDOCRINE A:  Glu 153-173 P:   - follow glu on BMP  NEUROLOGIC A:  SAH S/p aneurysm coiling  P:   RASS goal: 0 - on nimodipine, IVF at 125; as above I will decrease the IVF but will not give lasix unless OK with NSGY   FAMILY  - Updates: no family discussion 12/10  - Inter-disciplinary family meet or Palliative Care meeting due by:   12/17   TODAY'S SUMMARY:  SAH s/p coiling, developed new dyspnea, hypoxemia 12/10 in setting B infiltrates. Suspect diastolic CHF and pulm edema    Independent CC time 45 minutes   Levy Pupaobert Deago Burruss, MD, PhD 08/16/2014, 6:52 PM [facility] [phone] or if no answer [phone]

## 2014-08-16 NOTE — Progress Notes (Signed)
5:35 PM 08/16/2014 - consult fro Neursurg. S/p coiling of aneyrsym for headache. Admitted 08/13/2014. On 08/16/2014 4pm - new hypoxemia, needing NRB and mild increase wob.PCCM consultd  Plan Bedside consult Stat labs ordered  Dr. Kalman ShanMurali [name], M.D., [facility].C.C.P Pulmonary and Critical Care Medicine Staff Physician [facility] Pager: [phone], If no answer or between  15:00h - 7:00h: call [phone]  08/16/2014 5:42 PM

## 2014-08-16 NOTE — Plan of Care (Signed)
Problem: Acute Treatment Outcomes Goal: Pain controlled Outcome: Progressing     

## 2014-08-16 NOTE — Progress Notes (Signed)
Pt seen and examined. No issues overnight. Pt does still have HA and neck stiffness. No other complaints.  EXAM: Temp:  [97.7 F (36.5 C)-98.3 F (36.8 C)] 97.8 F (36.6 C) (12/10 0754) Pulse Rate:  [47-95] 58 (12/10 0634) Resp:  [12-21] 14 (12/10 0634) BP: (126-231)/(57-187) 158/68 mmHg (12/10 0600) SpO2:  [83 %-96 %] 90 % (12/10 0634) FiO2 (%):  [31 %] 31 % (12/09 2056) Intake/Output      12/09 0701 - 12/10 0700 12/10 0701 - 12/11 0700   P.O. 400    I.V. (mL/kg) 2875 (48.9)    Total Intake(mL/kg) 3275 (55.7)    Urine (mL/kg/hr) 2700 (1.9)    Blood     Total Output 2700     Net +575           Awake, alert, oriented Speech fluent CN grossly intact Good strength throughout  LABS: Lab Results  Component Value Date   CREATININE 0.62 08/14/2014   BUN 8 08/14/2014   NA 140 08/14/2014   K 4.3 08/14/2014   CL 104 08/14/2014   CO2 18* 08/14/2014   Lab Results  Component Value Date   WBC 8.7 08/13/2014   HGB 13.2 08/13/2014   HCT 38.7 08/13/2014   MCV 102.4* 08/13/2014   PLT 307 08/13/2014    IMPRESSION: - 53 y.o. female SAH d# 3 s/p RPICA aneurysm coiling - Neurologically intact  PLAN: - Cont Nimotop/Zocor. TCD monitoring for spasm

## 2014-08-17 ENCOUNTER — Inpatient Hospital Stay (HOSPITAL_COMMUNITY): Payer: Managed Care, Other (non HMO)

## 2014-08-17 DIAGNOSIS — I608 Other nontraumatic subarachnoid hemorrhage: Secondary | ICD-10-CM

## 2014-08-17 DIAGNOSIS — I517 Cardiomegaly: Secondary | ICD-10-CM

## 2014-08-17 LAB — CBC
HCT: 34.8 % — ABNORMAL LOW (ref 36.0–46.0)
Hemoglobin: 12.2 g/dL (ref 12.0–15.0)
MCH: 36.1 pg — ABNORMAL HIGH (ref 26.0–34.0)
MCHC: 35.1 g/dL (ref 30.0–36.0)
MCV: 103 fL — ABNORMAL HIGH (ref 78.0–100.0)
Platelets: 186 10*3/uL (ref 150–400)
RBC: 3.38 MIL/uL — ABNORMAL LOW (ref 3.87–5.11)
RDW: 12.6 % (ref 11.5–15.5)
WBC: 12.8 10*3/uL — ABNORMAL HIGH (ref 4.0–10.5)

## 2014-08-17 LAB — PRO B NATRIURETIC PEPTIDE: Pro B Natriuretic peptide (BNP): 1182 pg/mL — ABNORMAL HIGH (ref 0–125)

## 2014-08-17 LAB — BASIC METABOLIC PANEL
ANION GAP: 14 (ref 5–15)
BUN: 6 mg/dL (ref 6–23)
CALCIUM: 8.7 mg/dL (ref 8.4–10.5)
CO2: 23 mEq/L (ref 19–32)
Chloride: 92 mEq/L — ABNORMAL LOW (ref 96–112)
Creatinine, Ser: 0.42 mg/dL — ABNORMAL LOW (ref 0.50–1.10)
GFR calc non Af Amer: >90 mL/min (ref >90)
GLUCOSE: 147 mg/dL — AB (ref 70–99)
POTASSIUM: 4 meq/L (ref 3.7–5.3)
SODIUM: 129 meq/L — AB (ref 137–147)

## 2014-08-17 LAB — LACTIC ACID, PLASMA: LACTIC ACID, VENOUS: 0.7 mmol/L (ref 0.5–2.2)

## 2014-08-17 LAB — TROPONIN I: Troponin I: <0.3 ng/mL (ref <0.30)

## 2014-08-17 MED ORDER — FUROSEMIDE 10 MG/ML IJ SOLN
40.0000 mg | Freq: Once | INTRAMUSCULAR | Status: DC
  Administered 2014-08-17: 40 mg via INTRAVENOUS
  Filled 2014-08-17: qty 4

## 2014-08-17 MED ORDER — POTASSIUM CHLORIDE CRYS ER 20 MEQ PO TBCR
40.0000 meq | EXTENDED_RELEASE_TABLET | Freq: Three times a day (TID) | ORAL | Status: AC
  Administered 2014-08-17 (×2): 40 meq via ORAL
  Filled 2014-08-17 (×2): qty 2

## 2014-08-17 MED ORDER — PROMETHAZINE HCL 25 MG/ML IJ SOLN
25.0000 mg | INTRAMUSCULAR | Status: DC | PRN
  Filled 2014-08-17: qty 1

## 2014-08-17 MED ORDER — FUROSEMIDE 10 MG/ML IJ SOLN
40.0000 mg | Freq: Three times a day (TID) | INTRAMUSCULAR | Status: AC
  Administered 2014-08-17 (×2): 40 mg via INTRAVENOUS
  Filled 2014-08-17: qty 4

## 2014-08-17 NOTE — Plan of Care (Signed)
Problem: Progression Outcomes Goal: If vent dependent, tolerates weaning Outcome: Not Applicable Date Met:  35/70/17 Goal: Tolerating diet/TF at goal rate Outcome: Progressing

## 2014-08-17 NOTE — Progress Notes (Signed)
PULMONARY / CRITICAL CARE MEDICINE   Name: [name] MRN: [id number] DOB: 1960/10/13    ADMISSION DATE:  08/13/2014 CONSULTATION DATE:  08/13/14   REFERRING MD :  Dr Conchita ParisNundkumar, NSGY  CHIEF COMPLAINT:  hypoxemia  INITIAL PRESENTATION: 53 yo woman, active smoker, hx HTN, [facility] 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  Reportedly well post procedure, evolved hypoxemia w B interstitial infiltrates 12/10 requiring 1.00 NRB mask. PCCM asked to evaluate.   STUDIES:  CT head 12/7 >> large SAH basilar cisterns, no hydrocephalus  CT angio head 12/7 >> 2mm R PICA aneurysm, no significant change in size [facility]  SIGNIFICANT EVENTS: R PICA coiling 12/8 Acute hypoxemia with B infiltrates 12/10  SUBJECTIVE:  C/O of SOB.  VITAL SIGNS: Temp:  [97.4 F (36.3 C)-98.4 F (36.9 C)] 98.1 F (36.7 C) (12/11 0800) Pulse Rate:  [61-90] 85 (12/11 0800) Resp:  [11-23] 16 (12/11 0800) BP: (143-173)/(48-115) 166/92 mmHg (12/11 0800) SpO2:  [85 %-97 %] 94 % (12/11 0800) FiO2 (%):  [50 %-55 %] 55 % (12/11 0800)   HEMODYNAMICS:   VENTILATOR SETTINGS: Vent Mode:  [-]  FiO2 (%):  [50 %-55 %] 55 %  INTAKE / OUTPUT:  Intake/Output Summary (Last 24 hours) at 08/17/14 0920 Last data filed at 08/17/14 0800  Gross per 24 hour  Intake 1642.83 ml  Output   1125 ml  Net 517.83 ml   PHYSICAL EXAMINATION: General:  Obese woman, no distress but clearly uncomfortable.  Neuro:  Awake, oriented, moves all ext, non-focal HEENT:  OP clear, no stridor Cardiovascular:  Regular, no M Lungs:  decreased at B bases, no wheeze Abdomen:  Soft, mildly diffusely tender Musculoskeletal:  No edema Skin:  No rash  LABS:  CBC  Recent Labs Lab 08/13/14 0915 08/16/14 1843 08/17/14 0650  WBC 8.7 17.4* 12.8*  HGB 13.2 11.8* 12.2  HCT 38.7 34.3* 34.8*  PLT 307 192 186   Coag's  Recent Labs Lab 08/13/14 0915  APTT 23*  INR 1.00   BMET  Recent Labs Lab  08/14/14 0025 08/16/14 1843 08/17/14 0650  NA 140 130* 129*  K 4.3 4.5 4.0  CL 104 96 92*  CO2 18* 20 23  BUN 8 9 6   CREATININE 0.62 0.41* 0.42*  GLUCOSE 153* 150* 147*   Electrolytes  Recent Labs Lab 08/14/14 0025 08/16/14 1843 08/17/14 0650  CALCIUM 9.4 8.6 8.7  MG  --  1.7  --   PHOS  --  1.9*  --    Sepsis Markers  Recent Labs Lab 08/16/14 1843 08/17/14 0650  LATICACIDVEN 2.7* 0.7   ABG  Recent Labs Lab 08/16/14 1635  PHART 7.440  PCO2ART 30.5*  PO2ART 54.2*   Liver Enzymes  Recent Labs Lab 08/13/14 0915 08/16/14 1843  AST 31 45*  ALT 17 20  ALKPHOS 121* 114  BILITOT 0.3 0.8  ALBUMIN 3.8 3.0*   Cardiac Enzymes  Recent Labs Lab 08/13/14 0915 08/16/14 1843 08/17/14 0125 08/17/14 0650  TROPONINI <0.30 <0.30 <0.30  --   PROBNP  --  1286.0*  --  1182.0*   Glucose No results for input(s): GLUCAP in the last 168 hours.  Imaging Dg Chest Port 1 View  08/16/2014   CLINICAL DATA:  Shortness of breath and chest pain  EXAM: PORTABLE CHEST - 1 VIEW  COMPARISON:  04/16/2009  FINDINGS: Cardiac shadow is within normal limits. Patchy bilateral infiltrates are identified likely related to underlying pulmonary edema.  Followup imaging is recommended. No sizable effusion is seen.  IMPRESSION: Changes consistent with bilateral pulmonary edema. Continued followup is recommended.   Electronically Signed   By: Alcide CleverMark  Lukens M.D.   On: 08/16/2014 17:21   ASSESSMENT / PLAN:  PULMONARY A: Acute hypoxemic respiratory failure, paO2 54 on 0.55 mask  B interstitial infiltrates most consistent with cardiogenic pulm edema, consider also non-cardiac causes, ALI, other pneumonitis. Least likely infectious process. No reported aspiration event, no infectious prodrome. She has had periods of severe hypertension, suspect diastolic dysfxn Hx tobacco use P:   - Stable currently on 50% mask. - Diurese gently today to avoid vasospasm but normalize respiratory status. -  Continue albuterol prn given her tobacco hx.  CARDIOVASCULAR A: HTN Suspected diastolic dysfunction P:  - TTE ordered, will follow - Tight BP control, labetalol and hydralazine ordered for goal SBP < 160 - Diureses as above, watch for vasospasm.  RENAL A:  Mild AG acidosis (AG 22).  P:   - Replace electrolytes as indicated. - Lasix 40 mg IV q8 x2 doses. - BMET in AM.  GASTROINTESTINAL A:  Constipation P:   - Dulcolax, enema, colace all ordered. - PPI for acid prophylaxis.  HEMATOLOGIC A:  No acute issues P:  - CBC in AM. - Transfusion as per ICU protocol.  INFECTIOUS A:  B infiltrates, low clinical suspicion for PNA but must consider.  P:   - No abx at this time - Would treat empirically if she spikes temp, develops cough, sputum, decompensates in any way  ENDOCRINE A:  Glu 153-173 P:   - Follow glu on BMP.  NEUROLOGIC A:  SAH S/p aneurysm coiling  P:   RASS goal: 0 - On nimodipine, IVF at 125; as above I will decrease the IVF but will not give lasix unless OK with NSGY   FAMILY  - Updates: Patient updated bedside.  TODAY'S SUMMARY:  SAH s/p coiling, dyspnea due to pulmonary edema, no signs of infection at this time.  Will diurese and watch for vasospasm, hold in the ICU for monitoring.  Alyson ReedyWesam G. Deniece Rankin, M.D. [facility]. Pager: [phone]. After hours pager: [phone].

## 2014-08-17 NOTE — Progress Notes (Addendum)
Pt seen and examined. No issues overnight. Pt was hypoxic yesterday, on NRB now. HA has somewhat improved, still has neck stiffness.  EXAM: Temp:  [97.4 F (36.3 C)-98.4 F (36.9 C)] 98.4 F (36.9 C) (12/11 0400) Pulse Rate:  [59-90] 76 (12/11 0700) Resp:  [11-23] 13 (12/11 0700) BP: (143-173)/(48-119) 146/63 mmHg (12/11 0700) SpO2:  [85 %-97 %] 94 % (12/11 0700) FiO2 (%):  [50 %-55 %] 55 % (12/11 0700) Intake/Output      12/10 0701 - 12/11 0700 12/11 0701 - 12/12 0700   P.O.     I.V. (mL/kg) 1632.8 (27.8)    Total Intake(mL/kg) 1632.8 (27.8)    Urine (mL/kg/hr) 1125 (0.8)    Total Output 1125     Net +507.8           Awake, alert, oriented Speech fluent CN intact Moving all extremities well  LABS: Lab Results  Component Value Date   CREATININE 0.42* 08/17/2014   BUN 6 08/17/2014   NA 129* 08/17/2014   K 4.0 08/17/2014   CL 92* 08/17/2014   CO2 23 08/17/2014   Lab Results  Component Value Date   WBC 12.8* 08/17/2014   HGB 12.2 08/17/2014   HCT 34.8* 08/17/2014   MCV 103.0* 08/17/2014   PLT 186 08/17/2014    IMAGING: CXR demonstrated pulmonary edema  IMPRESSION: - 53 y.o. female SAH d# 4 s/p RPICA aneurysm coiling, neurologically intact - Pulmonary edema, likely cardiogenic, also could consider neurogenic given history  PLAN: - Cont Nimotop/Zocor and observation for spasm - With stable neurologic exam, would be okay to give lasix today if needed for pulmonary edema - PT/OT to mobilize

## 2014-08-17 NOTE — Progress Notes (Signed)
  Echocardiogram 2D Echocardiogram has been performed.  [name], [name] 08/17/2014, 9:27 AM

## 2014-08-17 NOTE — Progress Notes (Signed)
Patient ID: [name], female   DOB: 01/13/1961, 53 y.o.   MRN: [id number] More sleepy  With nausea and has vomitted x1.  No new CT since 12/7. Possible hydrocephalus.  Check CT.

## 2014-08-18 ENCOUNTER — Inpatient Hospital Stay (HOSPITAL_COMMUNITY): Payer: Managed Care, Other (non HMO)

## 2014-08-18 DIAGNOSIS — J810 Acute pulmonary edema: Secondary | ICD-10-CM

## 2014-08-18 DIAGNOSIS — G9340 Encephalopathy, unspecified: Secondary | ICD-10-CM

## 2014-08-18 DIAGNOSIS — J811 Chronic pulmonary edema: Secondary | ICD-10-CM | POA: Insufficient documentation

## 2014-08-18 LAB — CBC
HEMATOCRIT: 36.2 % (ref 36.0–46.0)
Hemoglobin: 12.5 g/dL (ref 12.0–15.0)
MCH: 34.2 pg — AB (ref 26.0–34.0)
MCHC: 34.5 g/dL (ref 30.0–36.0)
MCV: 98.9 fL (ref 78.0–100.0)
Platelets: 196 10*3/uL (ref 150–400)
RBC: 3.66 MIL/uL — ABNORMAL LOW (ref 3.87–5.11)
RDW: 12.3 % (ref 11.5–15.5)
WBC: 12.8 10*3/uL — ABNORMAL HIGH (ref 4.0–10.5)

## 2014-08-18 LAB — BASIC METABOLIC PANEL
ANION GAP: 17 — AB (ref 5–15)
BUN: 9 mg/dL (ref 6–23)
CO2: 25 meq/L (ref 19–32)
Calcium: 9.1 mg/dL (ref 8.4–10.5)
Chloride: 88 mEq/L — ABNORMAL LOW (ref 96–112)
Creatinine, Ser: 0.61 mg/dL (ref 0.50–1.10)
GFR calc Af Amer: >90 mL/min (ref >90)
GFR calc non Af Amer: >90 mL/min (ref >90)
Glucose, Bld: 127 mg/dL — ABNORMAL HIGH (ref 70–99)
POTASSIUM: 3.6 meq/L — AB (ref 3.7–5.3)
Sodium: 130 mEq/L — ABNORMAL LOW (ref 137–147)

## 2014-08-18 LAB — PHOSPHORUS: PHOSPHORUS: 3 mg/dL (ref 2.3–4.6)

## 2014-08-18 LAB — MAGNESIUM: MAGNESIUM: 1.8 mg/dL (ref 1.5–2.5)

## 2014-08-18 MED ORDER — SIMVASTATIN 40 MG PO TABS
40.0000 mg | ORAL_TABLET | Freq: Every day | ORAL | Status: DC
  Administered 2014-08-18 – 2014-08-23 (×6): 40 mg via ORAL
  Filled 2014-08-18 (×7): qty 1

## 2014-08-18 MED ORDER — NIMODIPINE 60 MG/20ML PO SOLN
60.0000 mg | ORAL | Status: DC
  Administered 2014-08-20: 60 mg
  Filled 2014-08-18 (×40): qty 20

## 2014-08-18 MED ORDER — MAGNESIUM SULFATE 2 GM/50ML IV SOLN
2.0000 g | Freq: Once | INTRAVENOUS | Status: AC
  Administered 2014-08-18: 2 g via INTRAVENOUS
  Filled 2014-08-18: qty 50

## 2014-08-18 MED ORDER — POTASSIUM CHLORIDE 10 MEQ/100ML IV SOLN
10.0000 meq | INTRAVENOUS | Status: AC
  Administered 2014-08-18 (×3): 10 meq via INTRAVENOUS
  Filled 2014-08-18 (×3): qty 100

## 2014-08-18 MED ORDER — CETYLPYRIDINIUM CHLORIDE 0.05 % MT LIQD
7.0000 mL | Freq: Two times a day (BID) | OROMUCOSAL | Status: DC
  Administered 2014-08-19 – 2014-08-22 (×8): 7 mL via OROMUCOSAL

## 2014-08-18 MED ORDER — MAGNESIUM SULFATE 2 GM/50ML IV SOLN: 2.0000 g | Freq: Once | INTRAVENOUS | Status: DC

## 2014-08-18 MED ORDER — NIMODIPINE 30 MG PO CAPS
60.0000 mg | ORAL_CAPSULE | ORAL | Status: DC
  Administered 2014-08-18 – 2014-08-24 (×34): 60 mg via ORAL
  Filled 2014-08-18 (×40): qty 2

## 2014-08-18 NOTE — Progress Notes (Signed)
PULMONARY / CRITICAL CARE MEDICINE   Name: Gabrielle DeistSharon R Pitcher MRN: [id number] DOB: [birth year]    ADMISSION DATE:  08/13/2014 CONSULTATION DATE:  08/13/14   REFERRING MD :  Dr Conchita ParisNundkumar, NSGY  CHIEF COMPLAINT:  hypoxemia  INITIAL PRESENTATION: 53 yo woman, active smoker, hx HTN, [facility] 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  Reportedly well post procedure, evolved hypoxemia w B interstitial infiltrates 12/10 requiring 1.00 NRB mask. PCCM asked to evaluate.   STUDIES and EVENTS CT head 12/7 >> large SAH basilar cisterns, no hydrocephalus  CT angio head 12/7 >> 2mm R PICA aneurysm, [facility] 12/8 Acute hypoxemia with B infiltrates 12/10  SUBJECTIVE:  08/18/14: On diuresis with lower fluids . Having increased irritability and agitation today needing restraints. CT yesterday shows stable hydroceph, improving SAH but NEW RT PICA INFARCT  VITAL SIGNS: Temp:  [97.3 F (36.3 C)-98.4 F (36.9 C)] 97.9 F (36.6 C) (12/12 0800) Pulse Rate:  [71-116] 88 (12/12 0700) Resp:  [11-27] 17 (12/12 0700) BP: (119-179)/(61-134) 163/76 mmHg (12/12 0700) SpO2:  [94 %-100 %] 100 % (12/12 0700) FiO2 (%):  [45 %-55 %] 45 % (12/11 1305)   HEMODYNAMICS:   VENTILATOR SETTINGS: Vent Mode:  [-]  FiO2 (%):  [45 %-55 %] 45 %  INTAKE / OUTPUT:  Intake/Output Summary (Last 24 hours) at 08/18/14 0959 Last data filed at 08/18/14 0800  Gross per 24 hour  Intake    110 ml  Output   5325 ml  Net  -5215 ml   PHYSICAL EXAMINATION: General:  Obese woman, no distress but clearly uncomfortable.  Neuro:  Awake, oriented, moves all ext, non-focal HEENT:  OP clear, no stridor Cardiovascular:  Regular, no M Lungs:  decreased at B bases, no wheeze Abdomen:  Soft, mildly diffusely tender Musculoskeletal:  No edema Skin:  No rash    LABS:  PULMONARY  Recent Labs Lab 08/16/14 1635  PHART 7.440  PCO2ART 30.5*  PO2ART 54.2*   HCO3 20.4  TCO2 21.3  O2SAT 88.6    CBC  Recent Labs Lab 08/16/14 1843 08/17/14 0650 08/18/14 0410  HGB 11.8* 12.2 12.5  HCT 34.3* 34.8* 36.2  WBC 17.4* 12.8* 12.8*  PLT 192 186 196    COAGULATION  Recent Labs Lab 08/13/14 0915  INR 1.00    CARDIAC   Recent Labs Lab 08/13/14 0915 08/16/14 1843 08/17/14 0125 08/17/14 0850  TROPONINI <0.30 <0.30 <0.30 <0.30    Recent Labs Lab 08/16/14 1843 08/17/14 0650  PROBNP 1286.0* 1182.0*     CHEMISTRY  Recent Labs Lab 08/13/14 0915 08/14/14 0025 08/16/14 1843 08/17/14 0650 08/18/14 0410  NA 141 140 130* 129* 130*  K 3.1* 4.3 4.5 4.0 3.6*  CL 100 104 96 92* 88*  CO2 24 18* 20 23 25   GLUCOSE 173* 153* 150* 147* 127*  BUN 8 8 9 6 9   CREATININE 0.63 0.62 0.41* 0.42* 0.61  CALCIUM 8.9 9.4 8.6 8.7 9.1  MG  --   --  1.7  --  1.8  PHOS  --   --  1.9*  --  3.0   Estimated Creatinine Clearance: 68.6 mL/min (by C-G formula based on Cr of 0.61).   LIVER  Recent Labs Lab 08/13/14 0915 08/16/14 1843  AST 31 45*  ALT 17 20  ALKPHOS 121* 114  BILITOT 0.3 0.8  PROT 6.8 6.1  ALBUMIN 3.8 3.0*  INR 1.00  --  INFECTIOUS  Recent Labs Lab 08/16/14 1843 08/17/14 0650  LATICACIDVEN 2.7* 0.7     ENDOCRINE CBG (last 3)  No results for input(s): GLUCAP in the last 72 hours.       IMAGING x48h Ct Head Wo Contrast  08/17/2014   CLINICAL DATA:  Subarachnoid hemorrhage with aneurysm coiling. Hydrocephalus.  EXAM: CT HEAD WITHOUT CONTRAST  TECHNIQUE: Contiguous axial images were obtained from the base of the skull through the vertex without intravenous contrast.  COMPARISON:  CT head 08/13/2014  FINDINGS: Interval coiling of right PICA aneurysm. Interval development of acute infarct in the right PICA territory.  Increase mass-effect in the posterior fossa of with slightly decreased size of the fourth ventricle. Mild obstructive hydrocephalus is unchanged from the prior study. Temporal horns are mildly  enlarged but stable. No shift of the midline structures  Diffuse subarachnoid hemorrhage has improved however there remains extensive high-density subarachnoid blood. No new hemorrhage.  IMPRESSION: Interval coiling of right PICA aneurysm.  Interval development of right PICA infarct. Increased edema in the posterior fossa. The fourth ventricle is slightly smaller. There is mild obstructive hydrocephalus which is stable.  Diffuse subarachnoid hemorrhage with mild interval improvement. No new hemorrhage identified.   Electronically Signed   By: Marlan Palau M.D.   On: 08/17/2014 20:32   Dg Chest Port 1 View  08/18/2014   CLINICAL DATA:  53 year old female with pulmonary edema  EXAM: PORTABLE CHEST - 1 VIEW  COMPARISON:  Prior chest x-ray obtain yesterday, 08/17/2014  FINDINGS: Metallic artifact overlies the chest in the form of cardiac leads. Stable cardiac and mediastinal contours. Significantly decreased pulmonary edema over the last 24 hr. No new focal airspace consolidation or pneumothorax. No large layering effusion. No acute osseous abnormality.  IMPRESSION: Significantly improved pulmonary edema.   Electronically Signed   By: Malachy Moan M.D.   On: 08/18/2014 07:20   Dg Chest Port 1 View  08/17/2014   CLINICAL DATA:  Hypoxemia.  EXAM: PORTABLE CHEST - 1 VIEW  COMPARISON:  August 16, 2014.  FINDINGS: The heart size and mediastinal contours are within normal limits. No pneumothorax or pleural effusion is noted. Stable mixed airspace and interstitial passed CT's are noted throughout both lungs consistent with edema or pneumonia. The visualized skeletal structures are unremarkable.  IMPRESSION: Stable bilateral diffuse lung opacities are noted concerning for edema or pneumonia.   Electronically Signed   By: Roque Lias M.D.   On: 08/17/2014 08:11   Dg Chest Port 1 View  08/16/2014   CLINICAL DATA:  Shortness of breath and chest pain  EXAM: PORTABLE CHEST - 1 VIEW  COMPARISON:  04/16/2009   FINDINGS: Cardiac shadow is within normal limits. Patchy bilateral infiltrates are identified likely related to underlying pulmonary edema. Followup imaging is recommended. No sizable effusion is seen.  IMPRESSION: Changes consistent with bilateral pulmonary edema. Continued followup is recommended.   Electronically Signed   By: Alcide Clever M.D.   On: 08/16/2014 17:21        ASSESSMENT / PLAN:  PULMONARY A: Acute hypoxemic respiratory failure, paO2 54 on 0.55 mask  B interstitial infiltrates most consistent with cardiogenic pulm edema, consider also non-cardiac causes, ALI, other pneumonitis. Least likely infectious process. No reported aspiration event, no infectious prodrome. She has had periods of severe hypertension, suspect diastolic dysfxn Hx tobacco use   - down to nasal cannula, cxr resolved edema  P:   - dc lasix due to new Rt pia infarct - o2 for  pulse ox > 88% -  CARDIOVASCULAR A: HTN Suspected diastolic dysfunction P:  -- Tight BP control, labetalol and hydralazine ordered for goal SBP < 160 -dc lasix  RENAL A:  Mild AG acidosis (AG 22).    - low K and low mag 08/18/14  P:   - dc lasix - restart saline at 100cc/h due to Rt PICA infarct  - replete k and mag - recheck labs  GASTROINTESTINAL A:  Constipation P:   - Dulcolax, enema, colace all ordered. - PPI for acid prophylaxis. - consider NG tube if unable to take po  HEMATOLOGIC A:  No acute issues P:  - CBC in AM. - Transfusion as per ICU protocol.  INFECTIOUS A:  B infiltrates, low clinical suspicion for PNA but must consider.  P:   - No abx at this time - Would treat empirically if she spikes temp, develops cough, sputum, decompensates in any way  ENDOCRINE A:  Glu 153-173 P:   - Follow glu on BMP.  NEUROLOGIC A:  SAH S/p aneurysm coiling   - more agitated 08/18/14 and CT c/w new RT PICA infarct 08/17/14  P:   RASS goal: 0 - On nimodipine - start statin to prevent vasospasm -  restart saline at 100cc./h - high risk for vasospasm  FAMILY  - Updates: Patient could not be updated bedside. No family at bedside 08/18/2014   TODAY'S SUMMARY:  SAH s/p coiling, dyspnea due to pulmonary edema, no signs of infection at this time.  DC  diurese but watch for vasospasm, hold in the ICU for monitoring.     The patient is critically ill with multiple organ systems failure and requires high complexity decision making for assessment and support, frequent evaluation and titration of therapies, application of advanced monitoring technologies and extensive interpretation of multiple databases.   Critical Care Time devoted to patient care services described in this note is  30  Minutes. This time reflects time of care of this signee Dr Kalman ShanMurali Jalonda [place]igua. This critical care time does not reflect procedure time, or teaching time or supervisory time of PA/NP/Med student/Med Resident etc but could involve care discussion time    Dr. Kalman ShanMurali Lemoyne Nestor, M.D., [facility] Center[facility].C.P Pulmonary and Critical Care Medicine Staff Physician [facility] [place] [facility] Pager: [phone], If no answer or between  15:00h - 7:00h: call [phone]  08/18/2014 10:09 AM

## 2014-08-18 NOTE — Progress Notes (Signed)
Patient ID: Gabrielle DeistSharon R Sax, female   DOB: 06/17/1961, 53 y.o.   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

## 2014-08-19 LAB — PRO B NATRIURETIC PEPTIDE: PRO B NATRI PEPTIDE: 339.2 pg/mL — AB (ref 0–125)

## 2014-08-19 LAB — BASIC METABOLIC PANEL
Anion gap: 17 — ABNORMAL HIGH (ref 5–15)
BUN: 12 mg/dL (ref 6–23)
CO2: 22 meq/L (ref 19–32)
CREATININE: 0.54 mg/dL (ref 0.50–1.10)
Calcium: 8.3 mg/dL — ABNORMAL LOW (ref 8.4–10.5)
Chloride: 93 mEq/L — ABNORMAL LOW (ref 96–112)
GFR calc Af Amer: >90 mL/min (ref >90)
GFR calc non Af Amer: >90 mL/min (ref >90)
GLUCOSE: 105 mg/dL — AB (ref 70–99)
Potassium: 3.9 mEq/L (ref 3.7–5.3)
SODIUM: 132 meq/L — AB (ref 137–147)

## 2014-08-19 LAB — CBC WITH DIFFERENTIAL/PLATELET
BASOS ABS: 0 10*3/uL (ref 0.0–0.1)
Basophils Relative: 0 % (ref 0–1)
EOS ABS: 0 10*3/uL (ref 0.0–0.7)
Eosinophils Relative: 0 % (ref 0–5)
HCT: 37 % (ref 36.0–46.0)
Hemoglobin: 12.7 g/dL (ref 12.0–15.0)
Lymphocytes Relative: 5 % — ABNORMAL LOW (ref 12–46)
Lymphs Abs: 0.4 10*3/uL — ABNORMAL LOW (ref 0.7–4.0)
MCH: 34.4 pg — ABNORMAL HIGH (ref 26.0–34.0)
MCHC: 34.3 g/dL (ref 30.0–36.0)
MCV: 100.3 fL — AB (ref 78.0–100.0)
MONOS PCT: 7 % (ref 3–12)
Monocytes Absolute: 0.6 10*3/uL (ref 0.1–1.0)
NEUTROS ABS: 7.9 10*3/uL — AB (ref 1.7–7.7)
Neutrophils Relative %: 88 % — ABNORMAL HIGH (ref 43–77)
PLATELETS: 186 10*3/uL (ref 150–400)
RBC: 3.69 MIL/uL — ABNORMAL LOW (ref 3.87–5.11)
RDW: 12.4 % (ref 11.5–15.5)
WBC: 8.9 10*3/uL (ref 4.0–10.5)

## 2014-08-19 LAB — MAGNESIUM: Magnesium: 2.3 mg/dL (ref 1.5–2.5)

## 2014-08-19 LAB — PHOSPHORUS: Phosphorus: 3.8 mg/dL (ref 2.3–4.6)

## 2014-08-19 LAB — CLOSTRIDIUM DIFFICILE BY PCR: Toxigenic C. Difficile by PCR: NEGATIVE

## 2014-08-19 MED ORDER — SODIUM CHLORIDE 1 G PO TABS
1.0000 g | ORAL_TABLET | Freq: Four times a day (QID) | ORAL | Status: DC
  Administered 2014-08-19 – 2014-08-24 (×20): 1 g via ORAL
  Filled 2014-08-19 (×24): qty 1

## 2014-08-19 MED ORDER — FAMOTIDINE 20 MG PO TABS
20.0000 mg | ORAL_TABLET | Freq: Every day | ORAL | Status: DC
  Administered 2014-08-19: 20 mg via ORAL
  Filled 2014-08-19 (×2): qty 1

## 2014-08-19 NOTE — Progress Notes (Addendum)
Patient ID: Gabrielle DeistSharon R Cowing, female   DOB: 24-Jan-1961, 53 y.o.   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 30 minutes.

## 2014-08-19 NOTE — Progress Notes (Signed)
PULMONARY / CRITICAL CARE MEDICINE   Name: [name] MRN: [id number] DOB: April 25, 1961    ADMISSION DATE:  08/13/2014 CONSULTATION DATE:  08/13/14   REFERRING MD :  Dr Conchita Paris, NSGY  CHIEF COMPLAINT:  hypoxemia  INITIAL PRESENTATION: 53 yo woman, active smoker, hx HTN, admitted 12/7 for [facility]. 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  Reportedly well post procedure, evolved hypoxemia w B interstitial infiltrates 12/10 requiring 1.00 NRB mask. PCCM asked to evaluate.   STUDIES and EVENTS CT head 12/7 >> large SAH basilar cisterns, no hydrocephalus  CT angio head 12/7 >> 2mm R PICA aneurysm, no significant change in size of the [facility] R PICA coiling 12/8 Acute hypoxemia with B infiltrates 12/10 08/18/14: On diuresis with lower fluids . Having increased irritability and agitation today needing restraints. CT yesterday shows stable hydroceph, improving  SAH but NEW RT PICA INFARCT    SUBJECTIVE/OVERNIGHT/INTERVAL HX 08/19/14: Neurosurgery continues with conservate Rx. Stays on room air after dc diuresis yesterday. Mild hyponatremia - salt tablet started by Dr Danielle Dess. Diarrhe and c diff being tested  VITAL SIGNS: Temp:  [97.4 F (36.3 C)-99.3 F (37.4 C)] 98.6 F (37 C) (12/13 0400) Pulse Rate:  [71-101] 72 (12/13 0900) Resp:  [11-21] 11 (12/13 0900) BP: (120-163)/(44-89) 120/67 mmHg (12/13 0900) SpO2:  [96 %-100 %] 97 % (12/13 0900)   HEMODYNAMICS:   VENTILATOR SETTINGS:    INTAKE / OUTPUT:  Intake/Output Summary (Last 24 hours) at 08/19/14 7829 Last data filed at 08/19/14 0800  Gross per 24 hour  Intake   2690 ml  Output   2400 ml  Net    290 ml   PHYSICAL EXAMINATION: General:  woman, no distress looks deconditioned Neuro:  Awake, oriented mostly , moves all ext, non-focal. Calm. Not agitated HEENT:  OP clear, no stridor Cardiovascular:  Regular, no M Lungs:  decreased at B bases, no wheeze Abdomen:  Soft, mildly diffusely  tender Musculoskeletal:  No edema Skin:  No rash    LABS:  PULMONARY  Recent Labs Lab 08/16/14 1635  PHART 7.440  PCO2ART 30.5*  PO2ART 54.2*  HCO3 20.4  TCO2 21.3  O2SAT 88.6    CBC  Recent Labs Lab 08/17/14 0650 08/18/14 0410 08/19/14 0318  HGB 12.2 12.5 12.7  HCT 34.8* 36.2 37.0  WBC 12.8* 12.8* 8.9  PLT 186 196 186    COAGULATION  Recent Labs Lab 08/13/14 0915  INR 1.00    CARDIAC    Recent Labs Lab 08/13/14 0915 08/16/14 1843 08/17/14 0125 08/17/14 0850  TROPONINI <0.30 <0.30 <0.30 <0.30    Recent Labs Lab 08/16/14 1843 08/17/14 0650 08/19/14 0318  PROBNP 1286.0* 1182.0* 339.2*     CHEMISTRY  Recent Labs Lab 08/14/14 0025 08/16/14 1843 08/17/14 0650 08/18/14 0410 08/19/14 0318  NA 140 130* 129* 130* 132*  K 4.3 4.5 4.0 3.6* 3.9  CL 104 96 92* 88* 93*  CO2 18* 20 23 25 22   GLUCOSE 153* 150* 147* 127* 105*  BUN 8 9 6 9 12   CREATININE 0.62 0.41* 0.42* 0.61 0.54  CALCIUM 9.4 8.6 8.7 9.1 8.3*  MG  --  1.7  --  1.8 2.3  PHOS  --  1.9*  --  3.0 3.8   Estimated Creatinine Clearance: 68.6 mL/min (by C-G formula based on Cr of 0.54).   LIVER  Recent Labs Lab 08/13/14 0915 08/16/14 1843  AST 31 45*  ALT 17 20  ALKPHOS 121* 114  BILITOT 0.3  0.8  PROT 6.8 6.1  ALBUMIN 3.8 3.0*  INR 1.00  --      INFECTIOUS  Recent Labs Lab 08/16/14 1843 08/17/14 0650  LATICACIDVEN 2.7* 0.7     ENDOCRINE CBG (last 3)  No results for input(s): GLUCAP in the last 72 hours.       IMAGING x48h Ct Head Wo Contrast  08/17/2014   CLINICAL DATA:  Subarachnoid hemorrhage with aneurysm coiling. Hydrocephalus.  EXAM: CT HEAD WITHOUT CONTRAST  TECHNIQUE: Contiguous axial images were obtained from the base of the skull through the vertex without intravenous contrast.  COMPARISON:  CT head 08/13/2014  FINDINGS: Interval coiling of right PICA aneurysm. Interval development of acute infarct in the right PICA territory.  Increase  mass-effect in the posterior fossa of with slightly decreased size of the fourth ventricle. Mild obstructive hydrocephalus is unchanged from the prior study. Temporal horns are mildly enlarged but stable. No shift of the midline structures  Diffuse subarachnoid hemorrhage has improved however there remains extensive high-density subarachnoid blood. No new hemorrhage.  IMPRESSION: Interval coiling of right PICA aneurysm.  Interval development of right PICA infarct. Increased edema in the posterior fossa. The fourth ventricle is slightly smaller. There is mild obstructive hydrocephalus which is stable.  Diffuse subarachnoid hemorrhage with mild interval improvement. No new hemorrhage identified.   Electronically Signed   By: Marlan Palauharles  Clark M.D.   On: 08/17/2014 20:32   Dg Chest Port 1 View  08/18/2014   CLINICAL DATA:  53 year old female with pulmonary edema  EXAM: PORTABLE CHEST - 1 VIEW  COMPARISON:  Prior chest x-ray obtain yesterday, 08/17/2014  FINDINGS: Metallic artifact overlies the chest in the form of cardiac leads. Stable cardiac and mediastinal contours. Significantly decreased pulmonary edema over the last 24 hr. No new focal airspace consolidation or pneumothorax. No large layering effusion. No acute osseous abnormality.  IMPRESSION: Significantly improved pulmonary edema.   Electronically Signed   By: Malachy MoanHeath  McCullough M.D.   On: 08/18/2014 07:20        ASSESSMENT / PLAN:  PULMONARY A: Acute hypoxemic respiratory failure, paO2 54 on 0.55 mask  B interstitial infiltrates most consistent with cardiogenic pulm edema, consider also non-cardiac causes, ALI, other pneumonitis. Least likely infectious process. No reported aspiration event, no infectious prodrome. She has had periods of severe hypertension, suspect diastolic dysfxn Hx tobacco use   - on room air 08/19/14. CXR 08/18/14 - resolved edema. Off lasix x 24h due to new Rt PICA infarct  P:   -stay off  lasix for now - o2 for pulse  ox > 88% -  CARDIOVASCULAR A: HTN Suspected diastolic dysfunction P:  -- Tight BP control, labetalol and hydralazine ordered for goal SBP < 160 -dc lasix since 08/18/14  RENAL A:  Mild AG acidosis (AG 22).     . Salt tab started for mild hyponatermia 08/19/14  P:   - dc lasix - restart saline at 100cc/h due to Rt PICA infarct since 08/18/14 - consider weaning off 08/20/14 - recheck labs  GASTROINTESTINAL A:  Constipation and being treated by laxative. Then diarrhea 08/19/14 - c diff being ruled out. SWallowing ok but intake poor   P:   - DC Dulcolax and senna - Change  PPI to H2 blockade for acid prophylaxis. - await c dff (but low prob to be positive, so hold off empiric rx) - oral diet  HEMATOLOGIC A:  No acute issues P:  - CBC in AM. - Transfusion as per ICU  protocol.  INFECTIOUS A:  B infiltrates, low clinical suspicion for PNA but must consider.  P:   - No abx at this time - Would treat empirically if she spikes temp, develops cough, sputum, decompensates in any way  ENDOCRINE A:  Glu 153-173 P:   - Follow glu on BMP.  NEUROLOGIC A:  SAH S/p aneurysm coiling    - more agitated 08/18/14 and CT c/w new RT PICA infarct 08/17/14 - improved 08/19/14  P:   RASS goal: 0 - On nimodipine - start statin 08/18/14 x 3 weeks  to prevent vasospasm - restart saline at 100cc./h sicne 08/18/14 - need to check with neurosurg about tapering this - high risk for vasospasm  FAMILY  - Updates: Patient could not be updated bedside. No family at bedside 08/19/2014   TODAY'S SUMMARY:  SAH s/p coiling, dyspnea due to pulmonary edema, no signs of infection at this time.  DC  diurese but watch for vasospasm, hold in the ICU for monitoring.     The patient is critically ill with multiple organ systems failure and requires high complexity decision making for assessment and support, frequent evaluation and titration of therapies, application of advanced monitoring  technologies and extensive interpretation of multiple databases.   Critical Care Time devoted to patient care services described in this note is  30  Minutes. This time reflects time of care of this signee Dr Kalman ShanMurali Jaxden Blyden. This critical care time does not reflect procedure time, or teaching time or supervisory time of PA/NP/Med student/Med Resident etc but could involve care discussion time    Dr. Kalman ShanMurali Ciel Yanes, M.D., [facility] Center[facility].C.C.P Pulmonary and Critical Care Medicine Staff Physician [place] [facility] [place] [facility] and Critical [facility] Pager: [phone], If no answer or between  15:00h - 7:00h: call [phone]  08/19/2014 9:52 AM

## 2014-08-20 LAB — CBC WITH DIFFERENTIAL/PLATELET
BASOS ABS: 0 10*3/uL (ref 0.0–0.1)
Basophils Relative: 0 % (ref 0–1)
Eosinophils Absolute: 0 10*3/uL (ref 0.0–0.7)
Eosinophils Relative: 0 % (ref 0–5)
HCT: 35 % — ABNORMAL LOW (ref 36.0–46.0)
Hemoglobin: 12 g/dL (ref 12.0–15.0)
Lymphocytes Relative: 7 % — ABNORMAL LOW (ref 12–46)
Lymphs Abs: 0.7 10*3/uL (ref 0.7–4.0)
MCH: 34.2 pg — ABNORMAL HIGH (ref 26.0–34.0)
MCHC: 34.3 g/dL (ref 30.0–36.0)
MCV: 99.7 fL (ref 78.0–100.0)
MONO ABS: 0.7 10*3/uL (ref 0.1–1.0)
Monocytes Relative: 8 % (ref 3–12)
NEUTROS ABS: 7.9 10*3/uL — AB (ref 1.7–7.7)
NEUTROS PCT: 85 % — AB (ref 43–77)
Platelets: 188 10*3/uL (ref 150–400)
RBC: 3.51 MIL/uL — ABNORMAL LOW (ref 3.87–5.11)
RDW: 12.2 % (ref 11.5–15.5)
WBC: 9.3 10*3/uL (ref 4.0–10.5)

## 2014-08-20 LAB — BASIC METABOLIC PANEL
ANION GAP: 12 (ref 5–15)
BUN: 11 mg/dL (ref 6–23)
CHLORIDE: 97 meq/L (ref 96–112)
CO2: 25 mEq/L (ref 19–32)
Calcium: 8.3 mg/dL — ABNORMAL LOW (ref 8.4–10.5)
Creatinine, Ser: 0.62 mg/dL (ref 0.50–1.10)
GFR calc Af Amer: >90 mL/min (ref >90)
GFR calc non Af Amer: >90 mL/min (ref >90)
Glucose, Bld: 113 mg/dL — ABNORMAL HIGH (ref 70–99)
Potassium: 4.2 mEq/L (ref 3.7–5.3)
SODIUM: 134 meq/L — AB (ref 137–147)

## 2014-08-20 LAB — MAGNESIUM: Magnesium: 1.9 mg/dL (ref 1.5–2.5)

## 2014-08-20 LAB — PHOSPHORUS: PHOSPHORUS: 3.1 mg/dL (ref 2.3–4.6)

## 2014-08-20 NOTE — Progress Notes (Signed)
Rehab Admissions Coordinator Note:  Patient was screened by Trish MageLogue, Aloura Matsuoka M for appropriateness for an Inpatient Acute Rehab Consult.  At this time, we are recommending Inpatient Rehab consult.  Trish MageLogue, Mykel Sponaugle M 08/20/2014, 2:27 PM  I can be reached at [phone].

## 2014-08-20 NOTE — Progress Notes (Signed)
Pt is comfortable on RA Alert and cognition appears intact There appear to be no further active pulm or critical care issues PCCM will sign off. Please call if we can be of further assistance  Please note that pt remains on dexamethasone. Taper as indicated per NS   Billy Fischeravid Charity Tessier, MD ; [facility] service Mobile [phone].  After 5:30 PM or weekends, call [phone]

## 2014-08-20 NOTE — Progress Notes (Signed)
Transcranial Doppler  Date POD PCO2 HCT BP  MCA ACA PCA OPHT SIPH VERT Basilar  08/13/14 JE     Right  Left   47 56 -70     38  34   20  22   58  23   -41  -20   -46      12-9- 15 SB     Right  Left   90  63   -113  -47   73  --   17  22   41/39  61   -34  -27     -78    08-17-2014 VS     Right  Left   65  62   69  -37   -68  45   30  32   33  32   -46  -45     -63    08/20/14 MS/KE      Right  Left   58  *   -73  *   35  *   25  20   33  28   -25  -31   -52            Right  Left                                            Right  Left                                            Right  Left                                        MCA = Middle Cerebral Artery      OPHT = Opthalmic Artery     BASILAR = Basilar Artery   ACA = Anterior Cerebral Artery     SIPH = Carotid Siphon PCA = Posterior Cerebral Artery   VERT = Verterbral Artery                   Normal MCA = 62+\-12 ACA = 50+\-12 PCA = 42+\-23   08/20/2014 10:20 AM Gertie FeyMichelle Avel Ogawa, RVT, RDCS, RDMS  Maggie FontKay Eltzroth, RVT, RDMS, RTR

## 2014-08-20 NOTE — Evaluation (Signed)
Physical Therapy Evaluation Patient Details Name: Gabrielle DeistSharon R Porter MRN: [id number] DOB: 04-12-1961 Today's Date: 08/20/2014   History of Present Illness  pt presents with R PICA Aneurysm post coiling and Angiography with Hydrocephalus.    Clinical Impression  Pt with impaired cognition and impaired balance.  Balance and safety worse when pt presented with cognitive task and when pt becomes irritated.  Pt needed cues to open eyes during session, though denies light sensitivity or visual deficits.  Will need further visual testing by OT.  At this time feel pt would benefit from CIR to maximize independence and safety prior to D/C to home.  Will continue to follow.      Follow Up Recommendations CIR    Equipment Recommendations   (TBD)    Recommendations for Other Services Rehab consult     Precautions / Restrictions Precautions Precautions: Fall Restrictions Weight Bearing Restrictions: No      Mobility  Bed Mobility Overal bed mobility: Needs Assistance Bed Mobility: Supine to Sit     Supine to sit: Min guard;HOB elevated     General bed mobility comments: pt with difficulty managing bed sheets and gets easily frustrated when feet were tangled in sheets.    Transfers Overall transfer level: Needs assistance Equipment used: None Transfers: Sit to/from UGI CorporationStand;Stand Pivot Transfers Sit to Stand: Min assist Stand pivot transfers: Min assist       General transfer comment: pt generally unsteady and needs cues to slow down and attend to task.  pt only able to stand for ~1-482mins before complaining of fatigue.    Ambulation/Gait                Stairs            Wheelchair Mobility    Modified Rankin (Stroke Patients Only) Modified Rankin (Stroke Patients Only) Pre-Morbid Rankin Score: No symptoms Modified Rankin: Severe disability     Balance Overall balance assessment: Needs assistance Sitting-balance support: No upper extremity supported;Feet  supported Sitting balance-Leahy Scale: Fair     Standing balance support: No upper extremity supported;During functional activity Standing balance-Leahy Scale: Fair                               Pertinent Vitals/Pain      Home Living Family/patient expects to be discharged to:: Private residence Living Arrangements: Spouse/significant other Available Help at Discharge: Family;Available PRN/intermittently Type of Home: House Home Access: Stairs to enter Entrance Stairs-Rails: None Entrance Stairs-Number of Steps: 1 Home Layout: One level Home Equipment: None      Prior Function Level of Independence: Independent         Comments: Pt works full time as an Pension scheme managerairline inspector for [employer]     Hand Dominance   Dominant Hand: Right    Extremity/Trunk Assessment   Upper Extremity Assessment: Generalized weakness           Lower Extremity Assessment: Generalized weakness      Cervical / Trunk Assessment: Normal  Communication   Communication: No difficulties  Cognition Arousal/Alertness: Awake/alert Behavior During Therapy: Anxious;Restless Overall Cognitive Status: Impaired/Different from baseline Area of Impairment: Orientation;Attention;Safety/judgement;Awareness;Problem solving Orientation Level: Disoriented to;Situation Current Attention Level: Sustained (but is easily distracted) Memory: Decreased short-term memory   Safety/Judgement: Decreased awareness of safety;Decreased awareness of deficits Awareness: Intellectual Problem Solving: Difficulty sequencing;Requires verbal cues;Requires tactile cues General Comments: Pt with confusion.  She is able to state where she is and is  aware of some of her deficits.  She is impulsive with poor safety awareness    General Comments      Exercises        Assessment/Plan    PT Assessment Patient needs continued PT services  PT Diagnosis Difficulty walking   PT Problem List Decreased  strength;Decreased activity tolerance;Decreased balance;Decreased mobility;Decreased coordination;Decreased cognition;Decreased knowledge of use of DME;Decreased safety awareness  PT Treatment Interventions DME instruction;Gait training;Stair training;Functional mobility training;Therapeutic activities;Therapeutic exercise;Balance training;Neuromuscular re-education;Cognitive remediation;Patient/family education   PT Goals (Current goals can be found in the Care Plan section) Acute Rehab PT Goals Patient Stated Goal: Back to work.   PT Goal Formulation: With patient Time For Goal Achievement: 09/03/14 Potential to Achieve Goals: Good    Frequency Min 3X/week   Barriers to discharge        Co-evaluation               End of Session Equipment Utilized During Treatment: Gait belt Activity Tolerance: Patient tolerated treatment well Patient left: in chair;with call bell/phone within reach;with family/visitor present;with nursing/sitter in room Nurse Communication: Mobility status         Time: [id number] PT Time Calculation (min) (ACUTE ONLY): 23 min   Charges:   PT Evaluation $Initial PT Evaluation Tier I: 1 Procedure $PT Re-evaluation: 1 Procedure PT Treatments $Therapeutic Activity: 8-22 mins   PT G CodesSunny Porter:          [name] F, [place]PT [phone] 08/20/2014, 2:01 PM

## 2014-08-20 NOTE — Evaluation (Signed)
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     Clinical Impression   Pt admitted with above. She demonstrates the below listed deficits and will benefit from continued OT to maximize safety and independence with BADLs.  Pt presents to OT with impaired cognition, diplopia (spot occlusion to glasses with reduction of diplopia); impaired balance.  Currently, she requires min - mod A with BADLs.   Feel she would be an excellent CIR candidate.  Will follow acutely       Follow Up Recommendations  CIR;Supervision/Assistance - 24 hour    Equipment Recommendations  3 in 1 bedside comode;Tub/shower seat    Recommendations for Other Services Rehab consult     Precautions / Restrictions Precautions Precautions: Fall Restrictions Weight Bearing Restrictions: No      Mobility Bed Mobility Overal bed mobility: Needs Assistance Bed Mobility: Supine to Sit     Supine to sit: Min guard;HOB elevated Sit to supine: Min guard   General bed mobility comments: pt with difficulty managing bed sheets and gets easily frustrated when feet were tangled in sheets.    Transfers Overall transfer level: Needs assistance Equipment used: None Transfers: Sit to/from [employer] Sit to Stand: Min assist Stand pivot transfers: Min assist       General transfer comment: pt generally unsteady and needs cues to slow down and attend to task.  pt only able to stand for ~1-35mins before complaining of fatigue.      Balance Overall balance assessment: Needs assistance Sitting-balance support: No upper extremity supported;Feet supported Sitting balance-Leahy Scale: Fair     Standing balance support: No upper extremity supported;During functional activity Standing balance-Leahy Scale:  Fair Standing balance comment: Mod A - leans to Lt.                             ADL Overall ADL's : Needs assistance/impaired Eating/Feeding: Minimal assistance;Bed level Eating/Feeding Details (indicate cue type and reason): Pt requires cues to locat items on tray.  She is unable to recall what she has for lunch despite having taken a bite of that item  Grooming: Wash/dry face;Wash/dry hands;Oral care;Brushing hair;Standing;Moderate assistance Grooming Details (indicate cue type and reason): Pt loses balance to Lt.  Upper Body Bathing: Minimal assitance;Sitting   Lower Body Bathing: Moderate assistance;Sit to/from stand Lower Body Bathing Details (indicate cue type and reason): requires assist due to impaired balance on Lt.  Upper Body Dressing : Minimal assistance;Sitting   Lower Body Dressing: Moderate assistance Lower Body Dressing Details (indicate cue type and reason): Pt able to don/doff slippers, but requires mod A for standing balance.  Loses balance to Lt.  Toilet Transfer: Moderate assistance;Ambulation;Comfort height toilet   Toileting- Clothing Manipulation and Hygiene: Moderate assistance;Sit to/from stand       Functional mobility during ADLs: Moderate assistance General ADL Comments: Pt confused throughout session     Vision Eye Alignment: Impaired (comment)   Ocular Range of Motion: Other (comment) Tracking/Visual Pursuits: Other (comment)     Diplopia Assessment: Disappears with one eye closed;Objects split side to side;Present in primary gaze;Only with right gaze   Additional Comments: Pt reports diplopia.   Pt with dysconjugate gaze with Rt eye esotropic.  She demonstrates full EOM both eyes, but difficulty sustaining lateral gaze with Rt. eye.  Diplopia disappears with Lt gaze both quadrants - pt became very tearful stating "I see one! I only see one!"   Spot occlusion to lt medial lens of her glasses with reduction in diplopia    Perception      Praxis      Pertinent Vitals/Pain       Hand Dominance Right   Extremity/Trunk Assessment Upper Extremity Assessment Upper Extremity Assessment: Generalized weakness   Lower Extremity Assessment Lower Extremity Assessment: Generalized weakness   Cervical / Trunk Assessment Cervical / Trunk Assessment: Normal   Communication Communication Communication: No difficulties   Cognition Arousal/Alertness: Awake/alert Behavior During Therapy: Anxious;Restless Overall Cognitive Status: Impaired/Different from baseline Area of Impairment: Orientation;Attention;Safety/judgement;Awareness;Problem solving Orientation Level: Disoriented to;Situation Current Attention Level: Sustained (but is easily distracted) Memory: Decreased short-term memory   Safety/Judgement: Decreased awareness of safety;Decreased awareness of deficits Awareness: Intellectual Problem Solving: Difficulty sequencing;Requires verbal cues;Requires tactile cues General Comments: Pt with confusion.  She is able to state where she is and is aware of some of her deficits.  She is impulsive with poor safety awareness   General Comments       Exercises       Shoulder Instructions      Home Living Family/patient expects to be discharged to:: Private residence Living Arrangements: Spouse/significant other Available Help at Discharge: Family;Available PRN/intermittently Type of Home: House Home Access: Stairs to enter Entrance Stairs-Number of Steps: 1 Entrance Stairs-Rails: None Home Layout: One level     Bathroom Shower/Tub: Producer, television/film/videoWalk-in shower   Bathroom Toilet: Standard     Home Equipment: None          Prior Functioning/Environment Level of Independence: Independent        Comments: Pt works full time as an Pension scheme managerairline inspector for [employer]    OT Diagnosis: Generalized weakness;Cognitive deficits;Disturbance of vision   OT Problem List: Decreased strength;Decreased activity tolerance;Impaired  balance (sitting and/or standing);Impaired vision/perception;Decreased cognition;Decreased safety awareness;Decreased knowledge of use of DME or AE   OT Treatment/Interventions: Self-care/ADL training;DME and/or AE instruction;Therapeutic activities;Cognitive remediation/compensation;Visual/perceptual remediation/compensation;Patient/family education;Balance training    OT Goals(Current goals can be found in the care plan section) Acute Rehab OT Goals Patient Stated Goal: Back to work.   OT Goal Formulation: With patient Time For Goal Achievement: 09/03/14 Potential to Achieve Goals: Good ADL Goals Pt Will Perform Eating: with modified independence;sitting;bed level Pt Will Perform Grooming: with min guard assist;standing Pt Will Perform Upper Body Bathing: with supervision;sitting Pt Will Perform Lower Body Bathing: with min guard assist;sit to/from stand Pt Will Perform Upper Body Dressing: with supervision;sitting Pt Will Perform Lower Body Dressing: with min guard assist;sit to/from stand Pt Will Transfer to Toilet: with min guard assist;ambulating;regular height toilet;grab bars Pt Will Perform Toileting - Clothing Manipulation and hygiene: with min guard assist;sit to/from stand Pt Will Perform Tub/Shower Transfer: Shower transfer;with min guard assist;ambulating;shower seat Pt/caregiver will Perform Home Exercise Program: With Supervision;With written HEP provided (HEP for vision ) Additional ADL Goal #1: Pt will consistently be oriented x 4  OT Frequency: Min 2X/week   Barriers to D/C:            Co-evaluation              End of Session Nurse Communication: Mobility status  Activity Tolerance: Patient tolerated treatment well Patient left: in bed;with call bell/phone within reach;with bed alarm set;with nursing/sitter in room   Time: [id number] OT Time Calculation (min): 23 min Charges:  OT General Charges $OT Visit: 1 Procedure OT  Evaluation $Initial OT  Evaluation Tier I: 1 Procedure OT Treatments $Self Care/Home Management : 8-22 mins $Therapeutic Activity: 23-37 mins G-Codes:    Oniya Mandarino M 08/20/2014, 4:46 PM

## 2014-08-21 MED ORDER — ACETAMINOPHEN 325 MG PO TABS: 650.0000 mg | ORAL_TABLET | ORAL | Status: DC | PRN

## 2014-08-21 NOTE — Consult Note (Signed)
Physical Medicine and Rehabilitation Consult [facility] Referring Physician: Dr. Conchita ParisNundkumar   HPI: Gabrielle DeistSharon R Porter is a 53 y.o. right handed female with history of hypertension and medical noncompliance. Admitted 08/13/2014 after developing sudden onset of severe headache and neck pain while at work. 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 Cranial CT scan revealed subarachnoid hemorrhage. 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 Underwent aneurysm coiling per Dr. Conchita ParisNundkumar 08/14/2014. Hospital course respiratory failure with follow-up for critical care medicine. Chest x-ray showed bilateral interstitial infiltrates consistent with cardiogenic pulmonary edema. [name] responded nicely to Lasix. Echocardiogram with ejection fraction of 75% normal systolic function. Patient currently maintained on Nimotop for blood pressure control. Tolerating a regular diet. 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 Physical therapy evaluation completed 08/20/2014 with recommendations of physical medicine rehabilitation consult.   Review of Systems  Gastrointestinal: Positive for nausea.  Neurological: Positive for dizziness and headaches.  All other systems reviewed and are negative.  Past Medical History  Diagnosis Date  . Hypertension    Past Surgical History  Procedure Laterality Date  . Cessarian N/A   . Radiology with anesthesia N/A 08/14/2014    Procedure: Embolization   (RADIOLOGY WITH ANESTHESIA) ;  Surgeon: Medication Radiologist, MD;  Location: [facility] OR;  Service: Radiology;  Laterality: N/A;   History reviewed. No pertinent family history. Social History:  reports that [name] has been smoking.  [name] does not have any smokeless tobacco history on file. Her alcohol and drug histories are not on  file. Allergies:  Allergies  Allergen Reactions  . Aspirin   . Ibuprofen   . Iodine   . Lobster [Shellfish Allergy]    Medications Prior to Admission  Medication Sig Dispense Refill  . acetaminophen (TYLENOL) 325 MG tablet Take 650 mg by mouth every 6 (six) hours as needed for mild pain.    . pseudoephedrine-acetaminophen (TYLENOL SINUS) 30-500 MG TABS Take 1 tablet by mouth every 4 (four) hours as needed (sinus).      Home: Home Living Family/patient expects to be discharged to:: Private residence Living Arrangements: Spouse/significant other Available Help at Discharge: Family, Available PRN/intermittently Type of Home: House Home Access: Stairs to enter Secretary/administratorntrance Stairs-Number of Steps: 1 Entrance Stairs-Rails: None Home Layout: One level Home Equipment: None  Functional History: Prior Function Level of Independence: Independent Comments: Pt works full time as an Pension scheme managerairline inspector for [employer] Functional Status:  Mobility: Bed Mobility Overal bed mobility: Needs Assistance Bed Mobility: Supine to Sit Supine to sit: Min guard, HOB elevated Sit to supine: Min guard General bed mobility comments: pt with difficulty managing bed sheets and gets easily frustrated when feet were tangled in sheets.   Transfers Overall transfer level: Needs assistance Equipment used: None Transfers: Sit to/from Stand, Stand Pivot Transfers Sit to Stand: Min assist Stand pivot transfers: Min assist General transfer comment: pt generally unsteady and needs cues to slow down and attend to task.  pt only able to stand for ~1-472mins before complaining of fatigue.        ADL: ADL Overall ADL's : Needs assistance/impaired Eating/Feeding: Minimal assistance, Bed level Eating/Feeding Details (indicate cue type and reason): Pt requires cues to locat items on tray.  [name] is unable to recall what [name] has for lunch despite having taken a bite of that item  Grooming: Wash/dry face, Wash/dry hands, Oral  care, Brushing hair, Standing, Moderate assistance Grooming Details (indicate cue type and reason): Pt loses balance to Lt.  Upper Body Bathing: Minimal assitance, Sitting Lower Body Bathing: Moderate assistance, Sit to/from stand Lower Body Bathing Details (indicate cue type and reason): requires assist due to impaired balance on Lt.  Upper Body Dressing : Minimal assistance, Sitting Lower Body Dressing: Moderate assistance Lower Body Dressing Details (indicate cue type and reason): Pt able to don/doff slippers, but requires mod A for standing balance.  Loses balance to Lt.  Toilet Transfer: Moderate assistance, Ambulation, Comfort height toilet Toileting- Clothing Manipulation and Hygiene: Moderate assistance, Sit to/from stand Functional mobility during ADLs: Moderate assistance General ADL Comments: Pt confused throughout session  Cognition: Cognition Overall Cognitive Status: Impaired/Different from baseline Orientation Level: Oriented X4 Cognition Arousal/Alertness: Awake/alert Behavior During Therapy: Anxious, Restless Overall Cognitive Status: Impaired/Different from baseline Area of Impairment: Orientation, Attention, Safety/judgement, Awareness, Problem solving Orientation Level: Disoriented to, Situation Current Attention Level: Sustained (but is easily distracted) Memory: Decreased short-term memory Safety/Judgement: Decreased awareness of safety, Decreased awareness of deficits Awareness: Intellectual Problem Solving: Difficulty sequencing, Requires verbal cues, Requires tactile cues General Comments: Pt with confusion.  [name] is able to state where [name] is and is aware of some of her deficits.  [name] is impulsive with poor safety awareness  Blood pressure 140/73, pulse 66, temperature 98 F (36.7 C), temperature source Oral, resp. rate 15, height 5\' 4"  (1.626 m), weight 58.8 kg (129 lb 10.1 oz), SpO2 100 %. Physical Exam  Vitals reviewed. Constitutional: [name] appears  well-developed.  HENT:  Head: Normocephalic.  Eyes: EOM are normal.  Neck: Normal range of motion. Neck supple. No thyromegaly present.  Cardiovascular: Normal rate and regular rhythm.   Respiratory: Effort normal and breath sounds normal. No respiratory distress.  GI: Soft. Bowel sounds are normal. [name] exhibits no distension.  Neurological:  Patient is a bit drowsy but arousable. [name] makes good eye contact with examiner. Fair awareness of her deficits. [name] can provide her name age and date of birth and follow simple commands. Has diplopia and dysconjugate gaze when looking to the right. Left sided limb ataxia in upper and lower extremities. No sensory deficits.   Skin: Skin is warm and dry.  Psychiatric: [name] has a normal mood and affect. Her behavior is normal.  Perhaps a little impulsive    No results found for this or any previous visit (from the past 24 hour(s)). No results found.  Assessment/Plan: Diagnosis: right PICA aneurysm/infarct 1. Does the need for close, 24 hr/day medical supervision in concert with the patient's rehab needs make it unreasonable for this patient to be served in a less intensive setting? Yes 2. Co-Morbidities requiring supervision/potential complications: pulmonary edema, pain mgt 3. Due to bladder management, bowel management, safety, skin/wound care, disease management, medication administration, pain management and patient education, does the patient require 24 hr/day rehab nursing? Yes 4. Does the patient require coordinated care of a physician, rehab nurse, PT (1-2 hrs/day, 5 days/week), OT (1-2 hrs/day, 5 days/week) and SLP (1-2 hrs/day, 5 days/week) to address physical and functional deficits in the context of the above medical diagnosis(es)? Yes Addressing deficits in the following areas: balance, endurance, locomotion, strength, transferring, bowel/bladder control, bathing, dressing, feeding, grooming, toileting, cognition and psychosocial  support 5. Can the patient actively participate in an intensive therapy program of at least 3 hrs of therapy per day at least 5 days per week? Yes 6. The potential  for patient to make measurable gains while on inpatient rehab is excellent 7. Anticipated functional outcomes upon discharge from inpatient rehab are modified independent  with PT, modified independent with OT, modified independent with SLP. 8. Estimated rehab length of stay to reach the above functional goals is: 7-10 days 9. Does the patient have adequate social supports and living environment to accommodate these discharge functional goals? Yes 10. Anticipated D/C setting: Home 11. Anticipated post D/C treatments: HH therapy and Outpatient therapy 12. Overall Rehab/Functional Prognosis: excellent  RECOMMENDATIONS: This patient's condition is appropriate for continued rehabilitative care in the following setting: CIR Patient has agreed to participate in recommended program. Yes Note that insurance prior authorization may be required for reimbursement for recommended care.  Comment: Rehab Admissions Coordinator to follow up.  Thanks,  Ranelle Oyster, MD, [place] [facility]     08/21/2014

## 2014-08-21 NOTE — Evaluation (Signed)
Speech Language Pathology Evaluation Patient Details Name: Gabrielle DeistSharon R Nesser MRN: [id number] DOB: 29-Aug-1961 Today's Date: 08/21/2014 Time: [id number] SLP Time Calculation (min) (ACUTE ONLY): 29 min  Problem List:  Patient Active Problem List   Diagnosis Date Noted  . Encephalopathy acute 08/18/2014  . Pulmonary edema   . Hypoxemia 08/16/2014  . SAH (subarachnoid hemorrhage)   . Subarachnoid hemorrhage due to ruptured aneurysm 08/13/2014  . BREAST PAIN, LEFT 04/16/2009   Past Medical History:  Past Medical History  Diagnosis Date  . Hypertension    Past Surgical History:  Past Surgical History  Procedure Laterality Date  . Cessarian N/A   . 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    Assessment / Plan / Recommendation Clinical Impression  Orders received; cognitive-linguistic evaluation completed.  Patient demonstrates moderate cognitive deficits characterized by poor sustained attention, intellectual awareness, retrieval of new information, basic and complex problem solving a well as poor frustration tolerance and suspected decreased inhibition.  As a result, patient would benefit from skilled SLP services to maximize her functional independence and reduce overall burned of care prior to discharge.   Upon discharge from acuet care patient would benefit from inpatient rehabilitation services.     SLP initiated education regarding use of external aids to assist with recall of new information, which patient and daughter verbalized understanding of.      SLP Assessment  Patient needs continued Speech Lanaguage Pathology Services    Follow Up Recommendations  24 hour supervision/assistance;Inpatient Rehab    Frequency and Duration min  2x/week  2 weeks   Pertinent Vitals/Pain Pain Assessment: No/denies pain   SLP Goals  Progression toward goals: Progressing toward goals Patient/Family Stated Goal: to get home Potential to Achieve Goals (ACUTE ONLY): Good Potential Considerations (ACUTE ONLY): Ability to learn/carryover information;Cooperation/participation level  SLP Evaluation Prior Functioning  Cognitive/Linguistic Baseline: Within functional limits Type of Home: House Vocation: Full time employment   Cognition  Overall Cognitive Status: Impaired/Different from baseline Arousal/Alertness: Awake/alert Orientation Level: Oriented X4 Attention: Sustained Sustained Attention: Impaired Sustained Attention Impairment: Verbal basic;Functional basic Memory: Impaired Memory Impairment: Retrieval deficit;Decreased recall of new information Awareness: Impaired Awareness Impairment: Intellectual impairment Problem Solving: Impaired Problem Solving Impairment: Functional basic Executive Function: Organizing;Self Correcting;Self Monitoring Organizing: Impaired Organizing Impairment: Functional basic Self Monitoring: Impaired Self Monitoring Impairment: Functional complex Self Correcting: Impaired Self Correcting Impairment: Functional complex Behaviors: Poor frustration tolerance;Verbal agitation Safety/Judgment: Impaired Comments: initially poor intellectual awareness, then more awareness in tasks as they were difficult which resulted in agitation and poor frustration tolerance    Comprehension  Auditory Comprehension Overall Auditory Comprehension: Impaired Commands: Impaired Multistep Basic Commands: 50-74% accurate Conversation: Simple Interfering Components: Attention;Visual impairments;Processing speed EffectiveTechniques: Extra processing time;Repetition Visual Recognition/Discrimination Discrimination: Exceptions to [facility]FL Reading Comprehension Reading Status: Not tested    Expression  Expression Primary Mode of Expression: Verbal Verbal Expression Overall Verbal Expression: Appears within functional limits for tasks assessed Pragmatics: Impairment Impairments: Topic maintenance;Turn Taking Written Expression Dominant Hand: Right Written Expression: Within Functional Limits Self Formulation Ability:  (bio info)   Oral / Motor Oral Motor/Sensory Function Overall Oral Motor/Sensory Function: Appears within functional limits for tasks assessed Motor Speech Overall Motor Speech: Appears within functional limits for tasks assessed   GO    Lenny Fiumara  Karen KaysBowie, M.A., CCC-SLP [phone]  Derrian Rodak 08/21/2014, 5:09 PM

## 2014-08-21 NOTE — Progress Notes (Signed)
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     OT comments  Pt demonstrates improved vision with less diplopia - now reports shadowing of objects in central field rather than diplopia.  Spot occlusion to glasses as pt reports tape fell off.  Pt with improved attention, and orientation, but intermittent confusion noted.  Perceptual deficits are also present as pt reports items in her room change locations, the walls on the left disappeared while ambulating ( no visual field deficit noted with confrontation testing).  Currently, she requires min A and verbal cues for BADLs.  Continue to recommend CIR.   Follow Up Recommendations  CIR;Supervision/Assistance - 24 hour    Equipment Recommendations  3 in 1 bedside comode;Tub/shower seat    Recommendations for Other Services Rehab consult    Precautions / Restrictions Precautions Precautions: Fall       Mobility Bed Mobility Overal bed mobility: Needs Assistance Bed Mobility: Supine to Sit;Sit to Supine     Supine to sit: Supervision Sit to supine: Supervision      Transfers Overall transfer level: Needs assistance Equipment used: None Transfers: Sit to/from Stand;Stand Pivot Transfers Sit to Stand: Min guard Stand pivot transfers: Min assist       General transfer comment: Pt requires assist for balance    Balance Overall balance assessment: Needs assistance Sitting-balance support: Feet supported Sitting balance-Leahy Scale: Good     Standing balance support: During functional activity Standing balance-Leahy Scale: Poor Standing balance comment: Pt requires occasional min A for dynamic balance during BADLs                   ADL Overall ADL's : Needs assistance/impaired Eating/Feeding: Supervision/ safety;Sitting   Grooming: Wash/dry face;Brushing hair;Minimal assistance;Standing               Lower Body Dressing: Minimal assistance;Sit to/from stand   Toilet Transfer: Minimal assistance;Ambulation;Comfort height toilet           Functional mobility during ADLs: Minimal assistance General ADL Comments: Pt requires min A for balance  and verbal cues for attention       Vision                 Additional Comments: Pt reports diplopia improved, but still present at times.  She has shadowing in her central fields today.   She reports perceptual deficts - the wall on the left disappeared while ambulating.  She reports her bed and frequently moves and changes position.   She was unable to read a poster in the hallway stating letters move and it was visually overstimulating.  No nystagmus noted during visual testing    Perception     Praxis      Cognition   Behavior During Therapy: Anxious Overall Cognitive Status: Impaired/Different from baseline Area of Impairment: Orientation;Attention;Memory;Safety/judgement;Awareness;Problem solving   Current Attention Level: Selective Memory: Decreased short-term memory    Safety/Judgement: Decreased awareness of safety;Decreased awareness of deficits Awareness: Emergent Problem Solving: Difficulty sequencing;Requires verbal cues;Requires tactile cues General Comments: Pt with variable performance.  She was oriented x 4 at beginning of session, and able to provide accurate info re: some earlier events, but not about others. As the session progressed, she required increased cues for attentiontion and mild confabulation noted.     Extremity/Trunk Assessment  Exercises     Shoulder Instructions       General Comments      Pertinent Vitals/ Pain          Home Living                                          Prior Functioning/Environment              Frequency Min 2X/week     Progress  Toward Goals  OT Goals(current goals can now be found in the care plan section)     ADL Goals Pt Will Perform Eating: with modified independence;sitting;bed level Pt Will Perform Grooming: with min guard assist;standing Pt Will Perform Upper Body Bathing: with supervision;sitting Pt Will Perform Lower Body Bathing: with min guard assist;sit to/from stand Pt Will Perform Upper Body Dressing: with supervision;sitting Pt Will Perform Lower Body Dressing: with min guard assist;sit to/from stand Pt Will Transfer to Toilet: with min guard assist;ambulating;regular height toilet;grab bars Pt Will Perform Toileting - Clothing Manipulation and hygiene: with min guard assist;sit to/from stand Pt Will Perform Tub/Shower Transfer: Shower transfer;with min guard assist;ambulating;shower seat Pt/caregiver will Perform Home Exercise Program: With Supervision;With written HEP provided (HEP for vision ) Additional ADL Goal #1: Pt will consistently be oriented x 4  Plan Discharge plan remains appropriate    Co-evaluation                 End of Session     Activity Tolerance Patient tolerated treatment well   Patient Left in bed;with call bell/phone within reach;with bed alarm set;with nursing/sitter in room   Nurse Communication Mobility status        Time: [id number] OT Time Calculation (min): 30 min  Charges: OT General Charges $OT Visit: 1 Procedure OT Treatments $Therapeutic Activity: 23-37 mins  [name] M 08/21/2014, 4:09 PM

## 2014-08-21 NOTE — Progress Notes (Signed)
Pt seen and examined. No issues overnight. Pt reports manageable HA. No other c/o.  EXAM: Temp:  [97.7 F (36.5 C)-99 F (37.2 C)] 97.9 F (36.6 C) (12/15 1601) Pulse Rate:  [60-89] 70 (12/15 1600) Resp:  [10-23] 21 (12/15 1600) BP: (94-170)/(47-104) 140/56 mmHg (12/15 1600) SpO2:  [95 %-100 %] 100 % (12/15 1600) Intake/Output      12/14 0701 - 12/15 0700 12/15 0701 - 12/16 0700   P.O. 480 480   I.V. (mL/kg) 2400 (40.8) 900 (15.3)   Total Intake(mL/kg) 2880 (49) 1380 (23.5)   Urine (mL/kg/hr)     Total Output       Net +2880 +1380        Urine Occurrence 6 x 3 x    Awake, alert Speech fluent Good strength throughout  LABS: Lab Results  Component Value Date   CREATININE 0.62 08/20/2014   BUN 11 08/20/2014   NA 134* 08/20/2014   K 4.2 08/20/2014   CL 97 08/20/2014   CO2 25 08/20/2014   Lab Results  Component Value Date   WBC 9.3 08/20/2014   HGB 12.0 08/20/2014   HCT 35.0* 08/20/2014   MCV 99.7 08/20/2014   PLT 188 08/20/2014    IMPRESSION: - 53 y.o. female SAH d# 8 s/p RPICA aneurysm coiling, neurologically well  PLAN: - Cont supportive care, observation for spasm - Appreciate PMR consult, if no evidence of spasm by the end of this week, will likely be ready for transfer to CIR.

## 2014-08-22 MED ORDER — DEXAMETHASONE SODIUM PHOSPHATE 4 MG/ML IJ SOLN: 4.0000 mg | Freq: Four times a day (QID) | INTRAMUSCULAR | Status: DC

## 2014-08-22 MED ORDER — DEXAMETHASONE 6 MG PO TABS
6.0000 mg | ORAL_TABLET | Freq: Four times a day (QID) | ORAL | Status: DC
  Filled 2014-08-22 (×3): qty 1

## 2014-08-22 MED ORDER — DEXAMETHASONE 4 MG PO TABS
4.0000 mg | ORAL_TABLET | Freq: Four times a day (QID) | ORAL | Status: DC
  Administered 2014-08-22 – 2014-08-23 (×4): 4 mg via ORAL
  Filled 2014-08-22 (×6): qty 1

## 2014-08-22 NOTE — Progress Notes (Signed)
Transcranial Doppler  Date POD PCO2 HCT BP  MCA ACA PCA OPHT SIPH VERT Basilar  08/13/14 JE     Right  Left   47 56 -70     38  34   20  22   58  23   -41  -20   -46      12-9- 15 SB     Right  Left   90  63   -113  -47   73  --   17  22   41/39  61   -34  -27     -78    08-17-2014 VS     Right  Left   65  62   69  -37   -68  45   30  32   33  32   -46  -45     -63    08/20/14 MS/KE      Right  Left   58  *   -73  *   35  *   25  20   33  28   -25  -31   -52      08/22/14 MS      Right  Left   81  *   -108  *   *  *   29  25   38  87   -24  *   *           Right  Left                                            Right  Left                                        MCA = Middle Cerebral Artery      OPHT = Opthalmic Artery     BASILAR = Basilar Artery   ACA = Anterior Cerebral Artery     SIPH = Carotid Siphon PCA = Posterior Cerebral Artery   VERT = Verterbral Artery                   Normal MCA = 62+\-12 ACA = 50+\-12 PCA = 42+\-23   Study was technically difficult due to patient movement.  *Unable to insonate  08/22/2014 4:09 PM Gertie FeyMichelle Locklyn Henriquez, RVT, RDCS, RDMS

## 2014-08-22 NOTE — Progress Notes (Signed)
I met with pt, her daughter, Roselyn Reef, and Jannette Spanner, at bedside. All in agreement to admission to inpt rehab when pt medically ready. Roselyn Reef can provide 24/7 assist at d/c. I have begun precert with [employer] for a possible admit to inpt rehab by Friday if pt medically ready per Dr. Kathyrn Sheriff. [phone]

## 2014-08-22 NOTE — Progress Notes (Signed)
[place] [facility] [place] [facility] [employer]  She is the case manager that assists with any discharge needs (from either acute care or CIR) and will follow the patient at home after discharge.  She can be reached for any assistance at [phone].  Carlyle LipaMichelle Ronnell Clinger, RN BSN MHA CCM  Case Manager, Trauma Service/Unit 27M [phone]

## 2014-08-22 NOTE — Progress Notes (Signed)
Pt seen and examined. No issues overnight. Says her double vision is improving. Some HA, manageable.  EXAM: Temp:  [97.9 F (36.6 C)-98.4 F (36.9 C)] 98.2 F (36.8 C) (12/16 1147) Pulse Rate:  [57-79] 65 (12/16 1015) Resp:  [8-21] 17 (12/16 1015) BP: (128-165)/(56-93) 165/61 mmHg (12/16 1015) SpO2:  [99 %-100 %] 100 % (12/16 1015) Intake/Output      12/15 0701 - 12/16 0700 12/16 0701 - 12/17 0700   P.O. 960    I.V. (mL/kg) 2400 (40.8) 200 (3.4)   Total Intake(mL/kg) 3360 (57.1) 200 (3.4)   Net +3360 +200        Urine Occurrence 7 x 1 x   Stool Occurrence      Awake, alert, oriented Mild inward deviation of right eye at rest Good strength throughout  LABS: Lab Results  Component Value Date   CREATININE 0.62 08/20/2014   BUN 11 08/20/2014   NA 134* 08/20/2014   K 4.2 08/20/2014   CL 97 08/20/2014   CO2 25 08/20/2014   Lab Results  Component Value Date   WBC 9.3 08/20/2014   HGB 12.0 08/20/2014   HCT 35.0* 08/20/2014   MCV 99.7 08/20/2014   PLT 188 08/20/2014    IMPRESSION: - 53 y.o. female SAH d#9 s/p RPICA aneurysm coiling, neurologically stable, improving right CN VI palsy   PLAN: - Cont monitoring/supportive care - Cont therapy - If remains free of clinical spasm by Fri, can consider transfer to CIR

## 2014-08-22 NOTE — Progress Notes (Signed)
PT Cancellation Note  Patient Details Name: Samara DeistSharon R Bon MRN: [id number] DOB: July 20, 1961   Cancelled Treatment:    Reason Eval/Treat Not Completed: Fatigue/lethargy limiting ability to participate.  Pt fatigued and requesting to nap.  Will f/u another time.     Waleska Buttery, Alison MurrayMegan F 08/22/2014, 2:15 PM

## 2014-08-23 MED ORDER — DEXAMETHASONE 4 MG PO TABS
4.0000 mg | ORAL_TABLET | Freq: Two times a day (BID) | ORAL | Status: DC
  Administered 2014-08-23 – 2014-08-24 (×2): 4 mg via ORAL
  Filled 2014-08-23 (×2): qty 1

## 2014-08-23 NOTE — Progress Notes (Signed)
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     OT comments  Pt demonstrates improving attention and problem solving.  She continues to be impulsive and dishinhibited.  She requires min A for BADls due to occasional LOB when distracted.  Vision improving.   Follow Up Recommendations  CIR;Supervision/Assistance - 24 hour    Equipment Recommendations  3 in 1 bedside comode;Tub/shower seat    Recommendations for Other Services Rehab consult    Precautions / Restrictions Precautions Precautions: Fall       Mobility Bed Mobility Overal bed mobility: Needs Assistance Bed Mobility: Supine to Sit     Supine to sit: Supervision        Transfers Overall transfer level: Needs assistance Equipment used: None Transfers: Stand Pivot Transfers;Sit to/from Stand Sit to Stand: Min guard Stand pivot transfers: Min guard            Balance Overall balance assessment: Needs assistance Sitting-balance support: Feet supported Sitting balance-Leahy Scale: Good     Standing balance support: During functional activity Standing balance-Leahy Scale: Fair                     ADL Overall ADL's : Needs assistance/impaired     Grooming: Wash/dry face;Brushing hair;Minimal assistance;Standing Grooming Details (indicate cue type and reason): Pt loses balance to Lt.                  Toilet Transfer: Minimal assistance;Ambulation;Comfort height toilet   Toileting- Clothing Manipulation and Hygiene: Maximal assistance;Sit to/from stand       Functional mobility during ADLs: Minimal assistance        Vision                 Additional Comments: Pt reports occlusion irritates her.  switched lenses as pt appears to be Lt eye dominant, but with no change.  She  functions better with Lt lens occluded.  Pt able to read small print with in a novel without difficulty - closes Rt. eye.  Pt able to read large poster in hallway today which she could not do last session.  She requires min A to for visual scanning with a random array of objects.    Perception     Praxis      Cognition   Behavior During Therapy: WFL for tasks assessed/performed Overall Cognitive Status: Impaired/Different from baseline Area of Impairment: Attention;Memory;Safety/judgement;Awareness;Problem solving   Current Attention Level: Selective Memory: Decreased short-term memory    Safety/Judgement: Decreased awareness of safety Awareness: Emergent Problem Solving: Difficulty sequencing;Requires verbal cues;Requires tactile cues General Comments: Pt is impulsive and disinhibited.   Cognition varies throughout session, but is able to perform activities in a minimally distractive environment today     Extremity/Trunk Assessment               Exercises     Shoulder Instructions       General Comments      Pertinent Vitals/ Pain       Pain Assessment: Faces Faces Pain Scale: Hurts little more Pain Location: back Pain Descriptors / Indicators: Aching  Home Living  Prior Functioning/Environment              Frequency Min 2X/week     Progress Toward Goals  OT Goals(current goals can now be found in the care plan section)  Progress towards OT goals: Progressing toward goals  ADL Goals Pt Will Perform Eating: with modified independence;sitting;bed level Pt Will Perform Grooming: with min guard assist;standing Pt Will Perform Upper Body Bathing: with supervision;sitting Pt Will Perform Lower Body Bathing: with min guard assist;sit to/from stand Pt Will Perform Upper Body Dressing: with supervision;sitting Pt Will Perform Lower Body Dressing: with min guard assist;sit to/from stand Pt Will  Transfer to Toilet: with min guard assist;ambulating;regular height toilet;grab bars Pt Will Perform Toileting - Clothing Manipulation and hygiene: with min guard assist;sit to/from stand Pt Will Perform Tub/Shower Transfer: Shower transfer;with min guard assist;ambulating;shower seat Pt/caregiver will Perform Home Exercise Program: With Supervision;With written HEP provided Additional ADL Goal #1: Pt will consistently be oriented x 4  Plan Discharge plan remains appropriate    Co-evaluation                 End of Session     Activity Tolerance Patient tolerated treatment well   Patient Left with call bell/phone within reach;in chair   Nurse Communication Mobility status        Time: [id number] OT Time Calculation (min): 32 min  Charges: OT General Charges $OT Visit: 1 Procedure OT Treatments $Therapeutic Activity: 23-37 mins  Aashi Derrington M 08/23/2014, 3:57 PM

## 2014-08-23 NOTE — Progress Notes (Signed)
UR completed.  Margrit Minner, RN BSN MHA CCM Trauma/Neuro ICU Case Manager [phone]  

## 2014-08-23 NOTE — Progress Notes (Signed)
Rehab admissions - I am following pt's case for Britta MccreedyBarbara, other admission coordinator, and spoke with pt's RN about pt status. Per RN, pt has had no further vasospasm and the medical team is hopeful for pt to come to inpatient rehab tomorrow. [place] did [place] [facility] [employer] [place]. Pt was resting and Rn stated that pt was aware of the plan.  Britta MccreedyBarbara, admission coordinator will check on pt's status tomorrow and we will consider inpatient rehab pending pt's medical clearance and our bed availability.  Thank you.  Juliann MuleJanine Jamian Andujo, PT Rehabilitation Admissions Coordinator [phone]

## 2014-08-23 NOTE — Progress Notes (Signed)
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      PT Comments    Pt with improved cognition and mobility today.  Pt declined to wear her glasses which had been taped by OT to A with vision, and pt began to have nausea and dizziness during mobility.  Spoke with pt about attempting to wear glasses to A with visual deficits and balance.  Still feel CIR most appropriate for D/C.    Follow Up Recommendations  CIR     Equipment Recommendations   (TBD)    Recommendations for Other Services       Precautions / Restrictions Precautions Precautions: Fall Restrictions Weight Bearing Restrictions: No    Mobility  Bed Mobility Overal bed mobility: Needs Assistance Bed Mobility: Supine to Sit     Supine to sit: Supervision     General bed mobility comments: pt needs increased time, but able to complete without A.    Transfers Overall transfer level: Needs assistance Equipment used: None Transfers: Sit to/from Stand Sit to Stand: Min guard         General transfer comment: pt without LOB and demos good use of UEs.    Ambulation/Gait Ambulation/Gait assistance: Min assist Ambulation Distance (Feet): 50 Feet Assistive device: None Gait Pattern/deviations: Step-through pattern;Decreased stride length     General Gait Details: pt declines wearing her glasses today that OT has taped to occlude.  pt indicating dizziness and feelings of nausea during mobility.  pt needs MinA and occasionally holds IV pole for support.     Stairs            Wheelchair Mobility    Modified Rankin (Stroke Patients Only) Modified Rankin (Stroke Patients Only) Pre-Morbid Rankin Score: No symptoms Modified Rankin: Moderately severe disability     Balance Overall balance assessment: Needs  assistance Sitting-balance support: No upper extremity supported;Feet supported Sitting balance-Leahy Scale: Good     Standing balance support: No upper extremity supported;During functional activity Standing balance-Leahy Scale: Fair                      Cognition Arousal/Alertness: Awake/alert Behavior During Therapy: WFL for tasks assessed/performed Overall Cognitive Status: Impaired/Different from baseline Area of Impairment: Awareness;Problem solving;Safety/judgement         Safety/Judgement: Decreased awareness of safety;Decreased awareness of deficits Awareness: Anticipatory Problem Solving: Difficulty sequencing;Requires verbal cues;Requires tactile cues General Comments: pt with improved cognition and decreased irritability today.      Exercises      General Comments        Pertinent Vitals/Pain Pain Assessment: 0-10 Pain Score: 4  Pain Location: Low back.   Pain Descriptors / Indicators: Aching Pain Intervention(s): Monitored during session;Repositioned    Home Living                      Prior Function            PT Goals (current goals can now be found in the care plan section) Acute Rehab PT Goals Patient Stated Goal: Back to work.   PT Goal Formulation: With patient Time For Goal Achievement: 09/03/14 Potential to Achieve Goals: Good Progress towards PT goals: Progressing toward goals    Frequency  Min 3X/week    PT Plan Current plan remains appropriate    Co-evaluation  End of Session Equipment Utilized During Treatment: Gait belt Activity Tolerance: Patient tolerated treatment well Patient left: in chair;with call bell/phone within reach     Time: 0751-0816 PT Time Calculation (min) (ACUTE ONLY): 25 min  Charges:  $Gait Training: 8-22 mins $Therapeutic Activity: 8-22 mins                    G CodesSunny Schlein:      Kennadie Brenner F, [place]PT [phone] 08/23/2014, 8:31 AM

## 2014-08-23 NOTE — Progress Notes (Signed)
Speech Language Pathology Treatment: Cognitive-Linquistic  Patient Details Name: Samara DeistSharon R Engebretson MRN: [id number] DOB: 1961-03-22 Today's Date: 08/23/2014 Time: [id number] SLP Time Calculation (min) (ACUTE ONLY): 25 min  Assessment / Plan / Recommendation Clinical Impression  Improvements made since initial assessment per pt report and documentation of evaluation. Various tasks/activities (simple ADL to complex problem solving) performed requiring intermittent min-mod verbal and visual cues (repeated info).  She sustained attention with supervision with mild-moderately disorganized thought process. Pt is eager and motivated to begin more intense rehab on 4 W where she will continue ST.    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    Pertinent Vitals Pain Assessment:  (yes, RN just gave pain meds) Pain Location:  (back)  SLP Plan  Continue with current plan of care    Recommendations  CIR              Oral Care Recommendations: Oral care BID Follow up Recommendations: Inpatient Rehab Plan: Continue with current plan of care    GO     Royce MacadamiaLitaker, Mahala Rommel Willis 08/23/2014, 3:02 PM   Breck CoonsLisa Willis Lonell FaceLitaker M.Ed [employer]-SLP Pager [phone]

## 2014-08-23 NOTE — Progress Notes (Signed)
Pt seen and examined. No issues overnight. Pt reports doing well. Blurry vision continues to slowly improve.  EXAM: Temp:  [98.2 F (36.8 C)-98.8 F (37.1 C)] 98.2 F (36.8 C) (12/17 1540) Pulse Rate:  [56-94] 87 (12/17 1600) Resp:  [9-24] 18 (12/17 1600) BP: (134-177)/(59-88) 156/66 mmHg (12/17 1600) SpO2:  [98 %-100 %] 99 % (12/17 1600) Intake/Output      12/16 0701 - 12/17 0700 12/17 0701 - 12/18 0700   P.O. 840    I.V. (mL/kg) 2300 (39.1) 1000 (17)   Total Intake(mL/kg) 3140 (53.4) 1000 (17)   Net +3140 +1000        Urine Occurrence 7 x 2 x   Stool Occurrence 1 x     Awake, alert, oriented Speech fluent Minimal right CN VI palsy Moving all ext well, good strength  LABS: Lab Results  Component Value Date   CREATININE 0.62 08/20/2014   BUN 11 08/20/2014   NA 134* 08/20/2014   K 4.2 08/20/2014   CL 97 08/20/2014   CO2 25 08/20/2014   Lab Results  Component Value Date   WBC 9.3 08/20/2014   HGB 12.0 08/20/2014   HCT 35.0* 08/20/2014   MCV 99.7 08/20/2014   PLT 188 08/20/2014    IMPRESSION: - 53 y.o. female SAH d# 10 s/p RPICA aneurysm coiling, neurologically stable, no clinical spasm  PLAN: - Cont supportive care - Will plan on total of 21d of Nimotop - Decrease dex to 4q12 today - If neurologically stable, transfer to CIR tomorrow

## 2014-08-24 ENCOUNTER — Inpatient Hospital Stay (HOSPITAL_COMMUNITY)
Admission: RE | Admit: 2014-08-24 | Discharge: 2014-08-29 | DRG: 066 | Disposition: A | Discharge status: Home / Self Care | Payer: Managed Care, Other (non HMO) | Source: Intra-hospital | Attending: Physical Medicine & Rehabilitation | Admitting: Physical Medicine & Rehabilitation

## 2014-08-24 DIAGNOSIS — E785 Hyperlipidemia, unspecified: Secondary | ICD-10-CM | POA: Diagnosis present

## 2014-08-24 DIAGNOSIS — I10 Essential (primary) hypertension: Secondary | ICD-10-CM | POA: Diagnosis present

## 2014-08-24 DIAGNOSIS — Z9119 Patient's noncompliance with other medical treatment and regimen: Secondary | ICD-10-CM | POA: Diagnosis present

## 2014-08-24 DIAGNOSIS — J449 Chronic obstructive pulmonary disease, unspecified: Secondary | ICD-10-CM | POA: Diagnosis present

## 2014-08-24 DIAGNOSIS — G9340 Encephalopathy, unspecified: Secondary | ICD-10-CM

## 2014-08-24 DIAGNOSIS — F17210 Nicotine dependence, cigarettes, uncomplicated: Secondary | ICD-10-CM | POA: Diagnosis present

## 2014-08-24 DIAGNOSIS — J811 Chronic pulmonary edema: Secondary | ICD-10-CM

## 2014-08-24 DIAGNOSIS — I609 Nontraumatic subarachnoid hemorrhage, unspecified: Principal | ICD-10-CM | POA: Insufficient documentation

## 2014-08-24 DIAGNOSIS — H532 Diplopia: Secondary | ICD-10-CM | POA: Diagnosis present

## 2014-08-24 DIAGNOSIS — I607 Nontraumatic subarachnoid hemorrhage from unspecified intracranial artery: Secondary | ICD-10-CM

## 2014-08-24 DIAGNOSIS — R0902 Hypoxemia: Secondary | ICD-10-CM

## 2014-08-24 DIAGNOSIS — I608 Other nontraumatic subarachnoid hemorrhage: Secondary | ICD-10-CM

## 2014-08-24 MED ORDER — NIMODIPINE 30 MG PO CAPS
60.0000 mg | ORAL_CAPSULE | ORAL | Status: DC
  Administered 2014-08-24 – 2014-08-29 (×27): 60 mg via ORAL
  Filled 2014-08-24 (×34): qty 2

## 2014-08-24 MED ORDER — ALBUTEROL SULFATE (2.5 MG/3ML) 0.083% IN NEBU: 2.5000 mg | INHALATION_SOLUTION | RESPIRATORY_TRACT | Status: DC | PRN

## 2014-08-24 MED ORDER — DEXAMETHASONE 4 MG PO TABS: 4.0000 mg | ORAL_TABLET | Freq: Two times a day (BID) | ORAL | Status: AC

## 2014-08-24 MED ORDER — ATORVASTATIN CALCIUM 40 MG PO TABS
40.0000 mg | ORAL_TABLET | Freq: Every day | ORAL | Status: DC
  Administered 2014-08-24 – 2014-08-28 (×5): 40 mg via ORAL
  Filled 2014-08-24 (×7): qty 1

## 2014-08-24 MED ORDER — SODIUM CHLORIDE 1 G PO TABS
1.0000 g | ORAL_TABLET | Freq: Four times a day (QID) | ORAL | Status: DC
  Administered 2014-08-24 – 2014-08-29 (×19): 1 g via ORAL
  Filled 2014-08-24 (×23): qty 1

## 2014-08-24 MED ORDER — HYDROCODONE-ACETAMINOPHEN 7.5-325 MG PO TABS
1.0000 | ORAL_TABLET | ORAL | Status: DC | PRN
  Administered 2014-08-24 – 2014-08-27 (×6): 1 via ORAL
  Filled 2014-08-24 (×6): qty 1

## 2014-08-24 MED ORDER — ONDANSETRON HCL 4 MG PO TABS: 4.0000 mg | ORAL_TABLET | Freq: Four times a day (QID) | ORAL | Status: DC | PRN

## 2014-08-24 MED ORDER — ACETAMINOPHEN 325 MG PO TABS
650.0000 mg | ORAL_TABLET | ORAL | Status: DC | PRN
  Administered 2014-08-26: 650 mg via ORAL
  Filled 2014-08-24: qty 2

## 2014-08-24 MED ORDER — ONDANSETRON HCL 4 MG/2ML IJ SOLN: 4.0000 mg | Freq: Four times a day (QID) | INTRAMUSCULAR | Status: DC | PRN

## 2014-08-24 MED ORDER — TRAMADOL HCL 50 MG PO TABS
50.0000 mg | ORAL_TABLET | Freq: Four times a day (QID) | ORAL | Status: DC | PRN
  Administered 2014-08-25 – 2014-08-28 (×2): 50 mg via ORAL
  Filled 2014-08-24 (×2): qty 1

## 2014-08-24 MED ORDER — DEXAMETHASONE 2 MG PO TABS: 2.0000 mg | ORAL_TABLET | Freq: Two times a day (BID) | ORAL | Status: DC

## 2014-08-24 MED ORDER — SORBITOL 70 % SOLN: 30.0000 mL | Freq: Every day | Status: DC | PRN

## 2014-08-24 MED ORDER — NIMODIPINE 60 MG/20ML PO SOLN
60.0000 mg | ORAL | Status: DC
  Filled 2014-08-24 (×34): qty 20

## 2014-08-24 MED ORDER — DEXAMETHASONE 1 MG PO TABS: 1.0000 mg | ORAL_TABLET | Freq: Two times a day (BID) | ORAL | Status: DC

## 2014-08-24 MED ORDER — SIMVASTATIN 40 MG PO TABS: 40.0000 mg | ORAL_TABLET | Freq: Every day | ORAL | Status: DC

## 2014-08-24 MED ORDER — DEXAMETHASONE 4 MG PO TABS
4.0000 mg | ORAL_TABLET | Freq: Two times a day (BID) | ORAL | Status: DC
  Administered 2014-08-24 – 2014-08-28 (×9): 4 mg via ORAL
  Filled 2014-08-24 (×14): qty 1

## 2014-08-24 MED ORDER — NIMODIPINE 30 MG PO CAPS: 60.0000 mg | ORAL_CAPSULE | ORAL | Status: DC

## 2014-08-24 NOTE — Discharge Summary (Signed)
  Physician Discharge Summary  Patient ID: [name] MRN: [id number] DOB/AGE: 01-06-1961 53 y.o.  Admit date: 08/13/2014 Discharge date: 08/24/2014  Admission Diagnoses: Subarachnoid Hemorrhage  Discharge Diagnoses: Same Active Problems:   Subarachnoid hemorrhage due to ruptured aneurysm   SAH (subarachnoid hemorrhage)   Hypoxemia   Encephalopathy acute   Pulmonary edema   Discharged Condition: Stable  Hospital Course:  [name] is a 53 y.o. female admitted after sudden onset of HA. 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 She was observed in the neuro ICU for vasospasm for 11 days without clinically evident spasm. PoShe did have some mild confusion which significantly improved, and was found to have a very mild right CN VI palsy which subjectively continued to improve. She was evaluated by PT and OT and PMR and found to be a good candidate for CIR. [facility] She is to continue Nimotop for a total of 21 days, and is on a decadron taper.  Treatments: Surgery - Diagnostic cerebral angiogram and coiling of right PICA aneurysm.  Discharge Exam: Blood pressure 156/84, pulse 72, temperature 98.5 F (36.9 C), temperature source Oral, resp. rate 14, height 5\' 4"  (1.626 m), weight 58.8 kg (129 lb 10.1 oz), SpO2 100 %. Awake, alert, oriented Speech fluent, appropriate CN grossly intact x minimal right CN VI palsy 5/5 BUE/BLE Wound c/d/i  Follow-up: Follow-up in my office [facility] [phone] in 4-6 weeks  Disposition: CIR     Medication List    STOP taking these medications        acetaminophen 325 MG tablet  Commonly known as:  TYLENOL      TAKE these medications        albuterol (2.5 MG/3ML) 0.083% nebulizer solution  Commonly known as:  PROVENTIL  Take 3 mLs (2.5 mg total) by nebulization every 4 (four) hours as needed for wheezing or shortness  of breath.     dexamethasone 4 MG tablet  Commonly known as:  DECADRON  Take 1 tablet (4 mg total) by mouth every 12 (twelve) hours.     dexamethasone 2 MG tablet  Commonly known as:  DECADRON  Take 1 tablet (2 mg total) by mouth 2 (two) times daily with a meal.  Start taking on:  08/27/2014     dexamethasone 1 MG tablet  Commonly known as:  DECADRON  Take 1 tablet (1 mg total) by mouth 2 (two) times daily with a meal.  Start taking on:  08/30/2014     niMODipine 30 MG capsule  Commonly known as:  NIMOTOP  Take 2 capsules (60 mg total) by mouth every 4 (four) hours.     pseudoephedrine-acetaminophen 30-500 MG Tabs  Commonly known as:  TYLENOL SINUS  Take 1 tablet by mouth every 4 (four) hours as needed (sinus).         SignedLisbeth Renshaw: Reagan Klemz, C 08/24/2014, 9:03 AM

## 2014-08-24 NOTE — H&P (Signed)
  Physical Medicine and Rehabilitation Admission H&P    Chief Complaint  Patient presents with  . Headache  : HPI: [name] is a 53 y.o. right handed female with history of hypertension and medical noncompliance. Admitted 08/13/2014 after developing sudden onset of severe headache and neck pain while at work. 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 Cranial CT scan revealed subarachnoid hemorrhage. 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 Underwent aneurysm coiling per Dr. Nundkumar. Decadron protocol 2 mg twice daily until 08/27/2014 and then 1 mg twice daily beginning 08/30/2014. 08/14/2014. Hospital course respiratory failure with follow-up for critical care medicine. Chest x-ray showed bilateral interstitial infiltrates consistent with cardiogenic pulmonary edema. She responded nicely to Lasix. Echocardiogram with ejection fraction of 75% normal systolic function. Patient currently maintained on Nimotop for blood pressure control. Tolerating a regular diet. 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 Physical therapy evaluation completed 08/20/2014 with recommendations of physical medicine rehabilitation consult.Patient is admitted for a comprehensive rehab program  ROS Review of Systems  Gastrointestinal: Positive for nausea.  Neurological: Positive for dizziness and headaches.  All other systems reviewed and are negative    Past Medical History  Diagnosis Date  . Hypertension    Past Surgical History  Procedure Laterality Date  . Cessarian N/A   . Radiology with anesthesia N/A 08/14/2014    Procedure: Embolization   (RADIOLOGY WITH ANESTHESIA) ;  Surgeon: Medication Radiologist, MD;  Location: [facility] OR;  Service: Radiology;  Laterality: N/A;   History reviewed. No pertinent family  history. Social History:  reports that she has been smoking.  She does not have any smokeless tobacco history on file. Her alcohol and drug histories are not on file. Allergies:  Allergies  Allergen Reactions  . Aspirin   . Ibuprofen   . Iodine   . Lobster [Shellfish Allergy]    Medications Prior to Admission  Medication Sig Dispense Refill  . acetaminophen (TYLENOL) 325 MG tablet Take 650 mg by mouth every 6 (six) hours as needed for mild pain.    . pseudoephedrine-acetaminophen (TYLENOL SINUS) 30-500 MG TABS Take 1 tablet by mouth every 4 (four) hours as needed (sinus).      Home: Home Living Family/patient expects to be discharged to:: Private residence Living Arrangements: Spouse/significant other Available Help at Discharge: Family, Available PRN/intermittently Type of Home: House Home Access: Stairs to enter Entrance Stairs-Number of Steps: 1 Entrance Stairs-Rails: None Home Layout: One level Home Equipment: None   Functional History: Prior Function Level of Independence: Independent Comments: Pt works full time as an airline inspector for [employer]  Functional Status:  Mobility: Bed Mobility Overal bed mobility: Needs Assistance Bed Mobility: Supine to Sit Supine to sit: Supervision Sit to supine: Supervision General bed mobility comments: pt needs increased time, but able to complete without A.   Transfers Overall transfer level: Needs assistance Equipment used: None Transfers: Stand Pivot Transfers, Sit to/from Stand Sit to Stand: Min guard Stand pivot transfers: Min guard General transfer comment: pt without LOB and demos good use of UEs.   Ambulation/Gait Ambulation/Gait assistance: Min assist Ambulation Distance (Feet): 50 Feet Assistive device: None Gait Pattern/deviations: Step-through pattern, Decreased stride length General Gait Details: pt declines wearing her glasses today that OT has taped to occlude.  pt indicating dizziness and feelings of  nausea during mobility.    pt needs MinA and occasionally holds IV pole for support.      ADL: ADL Overall ADL's : Needs assistance/impaired Eating/Feeding: Supervision/ safety, Sitting Eating/Feeding Details (indicate cue type and reason): Pt requires cues to locat items on tray.  She is unable to recall what she has for lunch despite having taken a bite of that item  Grooming: Wash/dry face, Brushing hair, Minimal assistance, Standing Grooming Details (indicate cue type and reason): Pt loses balance to Lt.  Upper Body Bathing: Minimal assitance, Sitting Lower Body Bathing: Moderate assistance, Sit to/from stand Lower Body Bathing Details (indicate cue type and reason): requires assist due to impaired balance on Lt.  Upper Body Dressing : Minimal assistance, Sitting Lower Body Dressing: Minimal assistance, Sit to/from stand Lower Body Dressing Details (indicate cue type and reason): Pt able to don/doff slippers, but requires mod A for standing balance.  Loses balance to Lt.  Toilet Transfer: Minimal assistance, Ambulation, Comfort height toilet Toileting- Clothing Manipulation and Hygiene: Maximal assistance, Sit to/from stand Functional mobility during ADLs: Minimal assistance General ADL Comments: Pt requires min A for balance  and verbal cues for attention   Cognition: Cognition Overall Cognitive Status: Impaired/Different from baseline Arousal/Alertness: Awake/alert Orientation Level: Oriented X4 Attention: Sustained Sustained Attention: Impaired Sustained Attention Impairment: Verbal basic, Functional basic Memory: Impaired Memory Impairment: Retrieval deficit, Decreased recall of new information Awareness: Impaired Awareness Impairment: Intellectual impairment Problem Solving: Impaired Problem Solving Impairment: Functional basic Executive Function: Organizing, Self Correcting, Self Monitoring Organizing: Impaired Organizing Impairment: Functional basic Self Monitoring:  Impaired Self Monitoring Impairment: Functional complex Self Correcting: Impaired Self Correcting Impairment: Functional complex Behaviors: Poor frustration tolerance, Verbal agitation Safety/Judgment: Impaired Comments: initially poor intellectual awareness, then more awareness in tasks as they were difficult which resulted in agitation and poor frustration tolerance Cognition Arousal/Alertness: Awake/alert Behavior During Therapy: WFL for tasks assessed/performed Overall Cognitive Status: Impaired/Different from baseline Area of Impairment: Attention, Memory, Safety/judgement, Awareness, Problem solving Orientation Level: Disoriented to, Situation Current Attention Level: Selective Memory: Decreased short-term memory Safety/Judgement: Decreased awareness of safety Awareness: Emergent Problem Solving: Difficulty sequencing, Requires verbal cues, Requires tactile cues General Comments: Pt is impulsive and disinhibited.   Cognition varies throughout session, but is able to perform activities in a minimally distractive environment today   Physical Exam: Blood pressure 156/84, pulse 72, temperature 98.5 F (36.9 C), temperature source Oral, resp. rate 14, height 5' 4" (1.626 m), weight 58.8 kg (129 lb 10.1 oz), SpO2 100 %. Physical Exam Constitutional: She appears well-developed.  HENT:  Head: Normocephalic.  Eyes: EOM are normal.  Neck: Normal range of motion. Neck supple. No thyromegaly present.  Cardiovascular: Normal rate and regular rhythm.  Respiratory: Effort normal and breath sounds normal. No respiratory distress.  GI: Soft. Bowel sounds are normal. She exhibits no distension.  Neurological:  Patient is alert. She makes good eye contact with examiner. Fair awareness of her deficits. She can provide her name age and date of birth and follow simple commands. Has diplopia and dysconjugate gaze when looking to the right and with central gaze--this has improved however. clumsy  with upper and lower extremities. No sensory deficits.  Skin: Skin is warm and dry.  Psychiatric: She has a normal mood and affect. Her behavior is normal.  Less impulsive  No results found for this or any previous visit (from the past 48 hour(s)). No results found.     Medical Problem List and Plan: 1. Functional deficits secondary to SAH/right PICA aneurysm status post coiling probably   2015. Decadron 2 mg twice a day until 08/27/2014 at 1 mg twice a day beginning 08/30/2014 2.  DVT Prophylaxis/Anticoagulation: SCDs. Monitor for any signs of DVT 3. Pain Management: Tylenol as needed 4. Hypertension. Nimotop protocol. Monitor with increased mobility. Patient with history of medical noncompliance in the past. Provide counseling 5. Neuropsych: This patient is capable of making decisions on her own behalf. 6. Skin/Wound Care: Routine skin checks 7. Fluids/Electrolytes/Nutrition: Strict I and O follow chemistries 8. COPD. Albuterol nebulizer as needed. No complaints of shortness of breath 9. Hyperlipidemia. Zocor 10. Diplopia: eye patch    Post Admission Physician Evaluation: 1. Functional deficits secondary  to right PICA aneurysm/infarct/SAH s/p coiling. 2. Patient is admitted to receive collaborative, interdisciplinary care between the physiatrist, rehab nursing staff, and therapy team. 3. Patient's level of medical complexity and substantial therapy needs in context of that medical necessity cannot be provided at a lesser intensity of care such as a SNF. 4. Patient has experienced substantial functional loss from his/her baseline which was documented above under the "Functional History" and "Functional Status" headings.  Judging by the patient's diagnosis, physical exam, and functional history, the patient has potential for functional progress which will result in measurable gains while on inpatient rehab.  These gains will be of substantial and practical use upon discharge  in  facilitating mobility and self-care at the household level. 5. Physiatrist will provide 24 hour management of medical needs as well as oversight of the therapy plan/treatment and provide guidance as appropriate regarding the interaction of the two. 6. 24 hour rehab nursing will assist with bladder management, bowel management, safety, skin/wound care, disease management, medication administration, pain management and patient education  and help integrate therapy concepts, techniques,education, etc. 7. PT will assess and treat for/with: Lower extremity strength, range of motion, stamina, balance, functional mobility, safety, adaptive techniques and equipment, NMR, visual-spatial awareness, community reintegration.   Goals are: mod I. 8. OT will assess and treat for/with: ADL's, functional mobility, safety, upper extremity strength, adaptive techniques and equipment, NMR, visual-spatial awareness, leisure support, community reintegration.   Goals are: mod I. Therapy may proceed with showering this patient. 9. SLP will assess and treat for/with: cognition, communication.  Goals are: mod I. 10. Case Management and Social Worker will assess and treat for psychological issues and discharge planning. 11. Team conference will be held weekly to assess progress toward goals and to determine barriers to discharge. 12. Patient will receive at least 3 hours of therapy per day at least 5 days per week. 13. ELOS: 7 days       14. Prognosis:  excellent     Lunden Stieber T. Saskia Simerson, MD, FAAPMR [facility] 08/24/2014   08/24/2014 

## 2014-08-24 NOTE — Progress Notes (Signed)
Physical Medicine and Rehabilitation Consult [facility] Referring Physician: Dr. Conchita ParisNundkumar   HPI: [name] is a 53 y.o. right handed female with history of hypertension and medical noncompliance. Admitted 08/13/2014 after developing sudden onset of severe headache and neck pain while at work. 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 Cranial CT scan revealed subarachnoid hemorrhage. 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 Underwent aneurysm coiling per Dr. Conchita ParisNundkumar 08/14/2014. Hospital course respiratory failure with follow-up for critical care medicine. Chest x-ray showed bilateral interstitial infiltrates consistent with cardiogenic pulmonary edema. She responded nicely to Lasix. Echocardiogram with ejection fraction of 75% normal systolic function. Patient currently maintained on Nimotop for blood pressure control. Tolerating a regular diet. 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 Physical therapy evaluation completed 08/20/2014 with recommendations of physical medicine rehabilitation consult.   Review of Systems  Gastrointestinal: Positive for nausea.  Neurological: Positive for dizziness and headaches.  All other systems reviewed and are negative.  Past Medical History  Diagnosis Date  . Hypertension    Past Surgical History  Procedure Laterality Date  . Cessarian N/A   . Radiology with anesthesia N/A 08/14/2014    Procedure: Embolization (RADIOLOGY WITH ANESTHESIA) ; Surgeon: Medication Radiologist, MD; Location: [facility] OR; Service: Radiology; Laterality: N/A;   History reviewed. No pertinent family history. Social History:  reports that she has been smoking. She does not have any smokeless tobacco history on file. Her alcohol and drug histories are not  on file. Allergies:  Allergies  Allergen Reactions  . Aspirin   . Ibuprofen   . Iodine   . Lobster [Shellfish Allergy]    Medications Prior to Admission  Medication Sig Dispense Refill  . acetaminophen (TYLENOL) 325 MG tablet Take 650 mg by mouth every 6 (six) hours as needed for mild pain.    . pseudoephedrine-acetaminophen (TYLENOL SINUS) 30-500 MG TABS Take 1 tablet by mouth every 4 (four) hours as needed (sinus).      Home: Home Living Family/patient expects to be discharged to:: Private residence Living Arrangements: Spouse/significant other Available Help at Discharge: Family, Available PRN/intermittently Type of Home: House Home Access: Stairs to enter Secretary/administratorntrance Stairs-Number of Steps: 1 Entrance Stairs-Rails: None Home Layout: One level Home Equipment: None  Functional History: Prior Function Level of Independence: Independent Comments: Pt works full time as an Pension scheme managerairline inspector for [employer] Functional Status:  Mobility: Bed Mobility Overal bed mobility: Needs Assistance Bed Mobility: Supine to Sit Supine to sit: Min guard, HOB elevated Sit to supine: Min guard General bed mobility comments: pt with difficulty managing bed sheets and gets easily frustrated when feet were tangled in sheets.  Transfers Overall transfer level: Needs assistance Equipment used: None Transfers: Sit to/from Stand, Stand Pivot Transfers Sit to Stand: Min assist Stand pivot transfers: Min assist General transfer comment: pt generally unsteady and needs cues to slow down and attend to task. pt only able to stand for ~1-242mins before complaining of fatigue.       ADL: ADL Overall ADL's : Needs assistance/impaired Eating/Feeding: Minimal assistance, Bed level Eating/Feeding Details (indicate cue type and reason): Pt requires cues to locat items on tray. She is unable to recall what she has for lunch despite having taken a bite of that item   Grooming: Wash/dry face, Wash/dry hands, Oral care, Brushing hair, Standing, Moderate assistance Grooming  Details (indicate cue type and reason): Pt loses balance to Lt.  Upper Body Bathing: Minimal assitance, Sitting Lower Body Bathing: Moderate assistance, Sit to/from stand Lower Body Bathing Details (indicate cue type and reason): requires assist due to impaired balance on Lt.  Upper Body Dressing : Minimal assistance, Sitting Lower Body Dressing: Moderate assistance Lower Body Dressing Details (indicate cue type and reason): Pt able to don/doff slippers, but requires mod A for standing balance. Loses balance to Lt.  Toilet Transfer: Moderate assistance, Ambulation, Comfort height toilet Toileting- Clothing Manipulation and Hygiene: Moderate assistance, Sit to/from stand Functional mobility during ADLs: Moderate assistance General ADL Comments: Pt confused throughout session  Cognition: Cognition Overall Cognitive Status: Impaired/Different from baseline Orientation Level: Oriented X4 Cognition Arousal/Alertness: Awake/alert Behavior During Therapy: Anxious, Restless Overall Cognitive Status: Impaired/Different from baseline Area of Impairment: Orientation, Attention, Safety/judgement, Awareness, Problem solving Orientation Level: Disoriented to, Situation Current Attention Level: Sustained (but is easily distracted) Memory: Decreased short-term memory Safety/Judgement: Decreased awareness of safety, Decreased awareness of deficits Awareness: Intellectual Problem Solving: Difficulty sequencing, Requires verbal cues, Requires tactile cues General Comments: Pt with confusion. She is able to state where she is and is aware of some of her deficits. She is impulsive with poor safety awareness  Blood pressure 140/73, pulse 66, temperature 98 F (36.7 C), temperature source Oral, resp. rate 15, height 5\' 4"  (1.626 m), weight 58.8 kg (129 lb 10.1 oz), SpO2 100 %. Physical Exam   Vitals reviewed. Constitutional: She appears well-developed.  HENT:  Head: Normocephalic.  Eyes: EOM are normal.  Neck: Normal range of motion. Neck supple. No thyromegaly present.  Cardiovascular: Normal rate and regular rhythm.  Respiratory: Effort normal and breath sounds normal. No respiratory distress.  GI: Soft. Bowel sounds are normal. She exhibits no distension.  Neurological:  Patient is a bit drowsy but arousable. She makes good eye contact with examiner. Fair awareness of her deficits. She can provide her name age and date of birth and follow simple commands. Has diplopia and dysconjugate gaze when looking to the right. Left sided limb ataxia in upper and lower extremities. No sensory deficits.  Skin: Skin is warm and dry.  Psychiatric: She has a normal mood and affect. Her behavior is normal.  Perhaps a little impulsive     Lab Results Last 24 Hours    No results found for this or any previous visit (from the past 24 hour(s)).    Imaging Results (Last 48 hours)    No results found.    Assessment/Plan: Diagnosis: right PICA aneurysm/infarct 1. Does the need for close, 24 hr/day medical supervision in concert with the patient's rehab needs make it unreasonable for this patient to be served in a less intensive setting? Yes 2. Co-Morbidities requiring supervision/potential complications: pulmonary edema, pain mgt 3. Due to bladder management, bowel management, safety, skin/wound care, disease management, medication administration, pain management and patient education, does the patient require 24 hr/day rehab nursing? Yes 4. Does the patient require coordinated care of a physician, rehab nurse, PT (1-2 hrs/day, 5 days/week), OT (1-2 hrs/day, 5 days/week) and SLP (1-2 hrs/day, 5 days/week) to address physical and functional deficits in the context of the above medical diagnosis(es)? Yes Addressing deficits in the following areas: balance, endurance, locomotion, strength,  transferring, bowel/bladder control, bathing, dressing, feeding, grooming, toileting, cognition and psychosocial support 5. Can the patient actively participate in an intensive therapy program of at least 3 hrs of therapy per day at least 5 days per week? Yes  6. The potential for patient to make measurable gains while on inpatient rehab is excellent 7. Anticipated functional outcomes upon discharge from inpatient rehab are modified independent with PT, modified independent with OT, modified independent with SLP. 8. Estimated rehab length of stay to reach the above functional goals is: 7-10 days 9. Does the patient have adequate social supports and living environment to accommodate these discharge functional goals? Yes 10. Anticipated D/C setting: Home 11. Anticipated post D/C treatments: HH therapy and Outpatient therapy 12. Overall Rehab/Functional Prognosis: excellent  RECOMMENDATIONS: This patient's condition is appropriate for continued rehabilitative care in the following setting: CIR Patient has agreed to participate in recommended program. Yes Note that insurance prior authorization may be required for reimbursement for recommended care.  Comment: Rehab Admissions Coordinator to follow up.  Thanks,  Ranelle OysterZachary T. Swartz, MD, [facility]     08/21/2014       Revision History     Date/Time User Provider Type Action   08/21/2014 4:17 PM Ranelle OysterZachary T Swartz, MD Physician Sign   08/21/2014 11:11 AM Charlton Amoraniel J Angiulli, PA-C Physician Assistant [place]   View Details Report       Routing History     Date/Time From To Method   08/21/2014 4:17 PM Ranelle OysterZachary T Swartz, MD Ranelle OysterZachary T Swartz, MD In Basket

## 2014-08-24 NOTE — H&P (View-Only) (Signed)
Physical Medicine and Rehabilitation Admission H&P    Chief Complaint  Patient presents with  . Headache  : HPI: [name] is a 53 y.o. right handed female with history of hypertension and medical noncompliance. Admitted 08/13/2014 after developing sudden onset of severe headache and neck pain while at work. 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 Cranial CT scan revealed subarachnoid hemorrhage. 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 Underwent aneurysm coiling per Dr. Conchita ParisNundkumar. Decadron protocol 2 mg twice daily until 08/27/2014 and then 1 mg twice daily beginning 08/30/2014. 08/14/2014. Hospital course respiratory failure with follow-up for critical care medicine. Chest x-ray showed bilateral interstitial infiltrates consistent with cardiogenic pulmonary edema. She responded nicely to Lasix. Echocardiogram with ejection fraction of 75% normal systolic function. Patient currently maintained on Nimotop for blood pressure control. Tolerating a regular diet. 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 Physical therapy evaluation completed 08/20/2014 with recommendations of physical medicine rehabilitation consult.Patient is admitted for a comprehensive rehab program  ROS Review of Systems  Gastrointestinal: Positive for nausea.  Neurological: Positive for dizziness and headaches.  All other systems reviewed and are negative    Past Medical History  Diagnosis Date  . Hypertension    Past Surgical History  Procedure Laterality Date  . Cessarian N/A   . Radiology with anesthesia N/A 08/14/2014    Procedure: Embolization   (RADIOLOGY WITH ANESTHESIA) ;  Surgeon: Medication Radiologist, MD;  Location: [facility] OR;  Service: Radiology;  Laterality: N/A;   History reviewed. No pertinent family  history. Social History:  reports that she has been smoking.  She does not have any smokeless tobacco history on file. Her alcohol and drug histories are not on file. Allergies:  Allergies  Allergen Reactions  . Aspirin   . Ibuprofen   . Iodine   . Lobster [Shellfish Allergy]    Medications Prior to Admission  Medication Sig Dispense Refill  . acetaminophen (TYLENOL) 325 MG tablet Take 650 mg by mouth every 6 (six) hours as needed for mild pain.    . pseudoephedrine-acetaminophen (TYLENOL SINUS) 30-500 MG TABS Take 1 tablet by mouth every 4 (four) hours as needed (sinus).      Home: Home Living Family/patient expects to be discharged to:: Private residence Living Arrangements: Spouse/significant other Available Help at Discharge: Family, Available PRN/intermittently Type of Home: House Home Access: Stairs to enter Secretary/administratorntrance Stairs-Number of Steps: 1 Entrance Stairs-Rails: None Home Layout: One level Home Equipment: None   Functional History: Prior Function Level of Independence: Independent Comments: Pt works full time as an Pension scheme managerairline inspector for [employer]  Functional Status:  Mobility: Bed Mobility Overal bed mobility: Needs Assistance Bed Mobility: Supine to Sit Supine to sit: Supervision Sit to supine: Supervision General bed mobility comments: pt needs increased time, but able to complete without A.   Transfers Overall transfer level: Needs assistance Equipment used: None Transfers: Stand Pivot Transfers, Sit to/from Stand Sit to Stand: Min guard Stand pivot transfers: Min guard General transfer comment: pt without LOB and demos good use of UEs.   Ambulation/Gait Ambulation/Gait assistance: Min assist Ambulation Distance (Feet): 50 Feet Assistive device: None Gait Pattern/deviations: Step-through pattern, Decreased stride length General Gait Details: pt declines wearing her glasses today that OT has taped to occlude.  pt indicating dizziness and feelings of  nausea during mobility.  pt needs MinA and occasionally holds IV pole for support.      ADL: ADL Overall ADL's : Needs assistance/impaired Eating/Feeding: Supervision/ safety, Sitting Eating/Feeding Details (indicate cue type and reason): Pt requires cues to locat items on tray.  She is unable to recall what she has for lunch despite having taken a bite of that item  Grooming: Wash/dry face, Brushing hair, Minimal assistance, Standing Grooming Details (indicate cue type and reason): Pt loses balance to Lt.  Upper Body Bathing: Minimal assitance, Sitting Lower Body Bathing: Moderate assistance, Sit to/from stand Lower Body Bathing Details (indicate cue type and reason): requires assist due to impaired balance on Lt.  Upper Body Dressing : Minimal assistance, Sitting Lower Body Dressing: Minimal assistance, Sit to/from stand Lower Body Dressing Details (indicate cue type and reason): Pt able to don/doff slippers, but requires mod A for standing balance.  Loses balance to Lt.  Toilet Transfer: Minimal assistance, Ambulation, Comfort height toilet Toileting- Clothing Manipulation and Hygiene: Maximal assistance, Sit to/from stand Functional mobility during ADLs: Minimal assistance General ADL Comments: Pt requires min A for balance  and verbal cues for attention   Cognition: Cognition Overall Cognitive Status: Impaired/Different from baseline Arousal/Alertness: Awake/alert Orientation Level: Oriented X4 Attention: Sustained Sustained Attention: Impaired Sustained Attention Impairment: Verbal basic, Functional basic Memory: Impaired Memory Impairment: Retrieval deficit, Decreased recall of new information Awareness: Impaired Awareness Impairment: Intellectual impairment Problem Solving: Impaired Problem Solving Impairment: Functional basic Executive Function: Organizing, Self Correcting, Self Monitoring Organizing: Impaired Organizing Impairment: Functional basic Self Monitoring:  Impaired Self Monitoring Impairment: Functional complex Self Correcting: Impaired Self Correcting Impairment: Functional complex Behaviors: Poor frustration tolerance, Verbal agitation Safety/Judgment: Impaired Comments: initially poor intellectual awareness, then more awareness in tasks as they were difficult which resulted in agitation and poor frustration tolerance Cognition Arousal/Alertness: Awake/alert Behavior During Therapy: WFL for tasks assessed/performed Overall Cognitive Status: Impaired/Different from baseline Area of Impairment: Attention, Memory, Safety/judgement, Awareness, Problem solving Orientation Level: Disoriented to, Situation Current Attention Level: Selective Memory: Decreased short-term memory Safety/Judgement: Decreased awareness of safety Awareness: Emergent Problem Solving: Difficulty sequencing, Requires verbal cues, Requires tactile cues General Comments: Pt is impulsive and disinhibited.   Cognition varies throughout session, but is able to perform activities in a minimally distractive environment today   Physical Exam: Blood pressure 156/84, pulse 72, temperature 98.5 F (36.9 C), temperature source Oral, resp. rate 14, height 5\' 4"  (1.626 m), weight 58.8 kg (129 lb 10.1 oz), SpO2 100 %. Physical Exam Constitutional: She appears well-developed.  HENT:  Head: Normocephalic.  Eyes: EOM are normal.  Neck: Normal range of motion. Neck supple. No thyromegaly present.  Cardiovascular: Normal rate and regular rhythm.  Respiratory: Effort normal and breath sounds normal. No respiratory distress.  GI: Soft. Bowel sounds are normal. She exhibits no distension.  Neurological:  Patient is alert. She makes good eye contact with examiner. Fair awareness of her deficits. She can provide her name age and date of birth and follow simple commands. Has diplopia and dysconjugate gaze when looking to the right and with central gaze--this has improved however. clumsy  with upper and lower extremities. No sensory deficits.  Skin: Skin is warm and dry.  Psychiatric: She has a normal mood and affect. Her behavior is normal.  Less impulsive  No results found for this or any previous visit (from the past 48 hour(s)). No results found.     Medical Problem List and Plan: 1. Functional deficits secondary to SAH/right PICA aneurysm status post coiling probably  2015. Decadron 2 mg twice a day until 08/27/2014 at 1 mg twice a day beginning 08/30/2014 2.  DVT Prophylaxis/Anticoagulation: SCDs. Monitor for any signs of DVT 3. Pain Management: Tylenol as needed 4. Hypertension. Nimotop protocol. Monitor with increased mobility. Patient with history of medical noncompliance in the past. Provide counseling 5. Neuropsych: This patient is capable of making decisions on her own behalf. 6. Skin/Wound Care: Routine skin checks 7. Fluids/Electrolytes/Nutrition: Strict I and O follow chemistries 8. COPD. Albuterol nebulizer as needed. No complaints of shortness of breath 9. Hyperlipidemia. Zocor 10. Diplopia: eye patch    Post Admission Physician Evaluation: 1. Functional deficits secondary  to right PICA aneurysm/infarct/SAH s/p coiling. 2. Patient is admitted to receive collaborative, interdisciplinary care between the physiatrist, rehab nursing staff, and therapy team. 3. Patient's level of medical complexity and substantial therapy needs in context of that medical necessity cannot be provided at a lesser intensity of care such as a SNF. 4. Patient has experienced substantial functional loss from his/her baseline which was documented above under the "Functional History" and "Functional Status" headings.  Judging by the patient's diagnosis, physical exam, and functional history, the patient has potential for functional progress which will result in measurable gains while on inpatient rehab.  These gains will be of substantial and practical use upon discharge  in  facilitating mobility and self-care at the household level. 5. Physiatrist will provide 24 hour management of medical needs as well as oversight of the therapy plan/treatment and provide guidance as appropriate regarding the interaction of the two. 6. 24 hour rehab nursing will assist with bladder management, bowel management, safety, skin/wound care, disease management, medication administration, pain management and patient education  and help integrate therapy concepts, techniques,education, etc. 7. PT will assess and treat for/with: Lower extremity strength, range of motion, stamina, balance, functional mobility, safety, adaptive techniques and equipment, NMR, visual-spatial awareness, community reintegration.   Goals are: mod I. 8. OT will assess and treat for/with: ADL's, functional mobility, safety, upper extremity strength, adaptive techniques and equipment, NMR, visual-spatial awareness, leisure support, community reintegration.   Goals are: mod I. Therapy may proceed with showering this patient. 9. SLP will assess and treat for/with: cognition, communication.  Goals are: mod I. 10. Case Management and Social Worker will assess and treat for psychological issues and discharge planning. 11. Team conference will be held weekly to assess progress toward goals and to determine barriers to discharge. 12. Patient will receive at least 3 hours of therapy per day at least 5 days per week. 13. ELOS: 7 days       14. Prognosis:  excellent     Ranelle Oyster, MD, [facility] Physical Medicine & Rehabilitation 08/24/2014   08/24/2014

## 2014-08-24 NOTE — Progress Notes (Signed)
Pt transferred to Rehab from 3W. Alert and orientated x 4. Diagnostic specific information provided. Pt orientated to rehab expectations, and therapy schedule. Pt looking forward to starting therapy in the morning.

## 2014-08-24 NOTE — Progress Notes (Signed)
I met with pt and her daughter at bedside. Both are in agreement to admission to inpt rehab today. Dr. Kathyrn Sheriff in agreement. I will arrange. [phone]

## 2014-08-24 NOTE — Interval H&P Note (Signed)
Samara DeistSharon R Reith was admitted today to Inpatient Rehabilitation with the diagnosis of subarachnoid hemorrhage.  The patient's history has been reviewed, patient examined, and there is no change in status.  Patient continues to be appropriate for intensive inpatient rehabilitation.  I have reviewed the patient's chart and labs.  Questions were answered to the patient's satisfaction.  Jaedyn Marrufo T 08/24/2014, 8:38 PM

## 2014-08-24 NOTE — Progress Notes (Signed)
PMR Admission Coordinator Pre-Admission Assessment  Patient: [name] is an 53 y.o., female MRN: [id number] DOB: 10/01/1960 Height: 5\' 4"  (162.6 cm) Weight: 58.8 kg (129 lb 10.1 oz)  Insurance Information HMO: PPO: PCP: IPA: 80/20: OTHER: point of service PRIMARY: A[employer] Policy#: [id number] Subscriber: pt CM Name: Kathie RhodesBetty Phone#: [phone] Fax#: [phone] Pre-Cert#: [id number] Employer: [employer] approved until 08/31/14 Benefits: Phone #: [phone] Name: 08/22/14 Eff. Date: 07/08/10 Deduct: $300 Out of Pocket Max: $2500 Life Max: none CIR: 90% SNF: 90% 120 days Outpatient: 90% Co-Pay: no visit limit Home Health: 90% Co-Pay: 12- visits combined DME: 90% Co-Pay: 10% Providers: in network  SECONDARY: none   Medicaid Application Date: Case Manager:  Disability Application Date: Case Worker:  Talmage NapFiance, John, states pt does not have short term disability. Pt states she does.   Emergency Contact Information Contact Information    Name Relation Home Work Mobile   [name] Significant other   [phone]   [name] Daughter   [phone]     Current Medical History  Patient Admitting Diagnosis: right PICA aneurysm/infarct  History of Present Illness:[name] is a 53 y.o. right handed female with history of hypertension and medical noncompliance. Admitted 08/13/2014 after developing sudden onset of severe headache and neck pain while at work. 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 Cranial CT scan revealed subarachnoid hemorrhage. 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 Underwent aneurysm coiling per Dr. Conchita ParisNundkumar  08/14/2014. Hospital course respiratory failure with follow-up for critical care medicine. Chest x-ray showed bilateral interstitial infiltrates consistent with cardiogenic pulmonary edema. She responded nicely to Lasix. Echocardiogram with ejection fraction of 75% normal systolic function. Patient currently maintained on Nimotop for blood pressure control. Tolerating a regular diet. 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  NIH Total: 1    Past Medical History  Past Medical History  Diagnosis Date  . Hypertension     Family History  family history is not on file.  Prior Rehab/Hospitalizations: none  Current Medications  Current facility-administered medications: 0.9 % sodium chloride infusion, , Intravenous, Continuous, Arby BarretteMarcy Pfeiffer, MD, Last Rate: 100 mL/hr at 08/24/14 0500, 100 mL/hr at 08/24/14 0500; acetaminophen (TYLENOL) tablet 650 mg, 650 mg, Oral, Q4H PRN, Lisbeth RenshawNeelesh Nundkumar, MD; albuterol (PROVENTIL) (2.5 MG/3ML) 0.083% nebulizer solution 2.5 mg, 2.5 mg, Nebulization, Q4H PRN, Leslye Peerobert S Byrum, MD atorvastatin (LIPITOR) tablet 40 mg, 40 mg, Oral, q1800, Hewitt Shortsobert W Nudelman, MD, 40 mg at 08/23/14 1751; dexamethasone (DECADRON) tablet 4 mg, 4 mg, Oral, Q12H, Lisbeth RenshawNeelesh Nundkumar, MD, 4 mg at 08/23/14 2101; hydrALAZINE (APRESOLINE) injection 5-10 mg, 5-10 mg, Intravenous, Q1H PRN, Hewitt Shortsobert W Nudelman, MD, 10 mg at 08/20/14 1444 labetalol (NORMODYNE,TRANDATE) injection 5-20 mg, 5-20 mg, Intravenous, Q1H PRN, Hewitt Shortsobert W Nudelman, MD, 10 mg at 08/18/14 0017; morphine 2 MG/ML injection 1-4 mg, 1-4 mg, Intravenous, Q1H PRN, Hewitt Shortsobert W Nudelman, MD, 2 mg at 08/23/14 1906; niMODipine (NIMOTOP) capsule 60 mg, 60 mg, Oral, Q4H, 60 mg at 08/24/14 0802 **OR** NiMODipine (NYMALIZE) 60 MG/20ML oral solution 60 mg, 60 mg, Per Tube, Q4H, Barnett AbuHenry Elsner, MD, 60 mg at 08/20/14 0743 ondansetron  [facility](ZOFRAN) injection 4-8 mg, 4-8 mg, Intravenous, Q6H PRN, Hewitt Shortsobert W Nudelman, MD, 4 mg at 08/17/14 1837; promethazine (PHENERGAN) injection 25 mg, 25 mg, Intramuscular, Q4H PRN, Barnett AbuHenry Elsner, MD, 25 mg at 08/17/14 1942; simvastatin (ZOCOR) tablet 40 mg,  40 mg, Oral, QHS, Kalman ShanMurali Ramaswamy, MD, 40 mg at 08/23/14 2100; sodium chloride tablet 1 g, 1 g, Oral, QID, Barnett AbuHenry Elsner, MD, 1 g at 08/23/14 2101  Patients Current Diet: Diet regular  Precautions / Restrictions Precautions Precautions: Fall Restrictions Weight Bearing Restrictions: No   Prior Activity Level Community (5-7x/wk): active and works fulltime   Journalist, newspaperHome Assistive Devices / Corporate investment bankerquipment Home Assistive Devices/Equipment: None Home Equipment: None  Prior Functional Level Prior Function Level of Independence: Independent Comments: Pt works full time as an Pension scheme managerairline inspector for [employer]  Current Functional Level Cognition  Arousal/Alertness: Awake/alert Overall Cognitive Status: Impaired/Different from baseline Current Attention Level: Selective Orientation Level: Oriented X4 Safety/Judgement: Decreased awareness of safety General Comments: Pt is impulsive and disinhibited. Cognition varies throughout session, but is able to perform activities in a minimally distractive environment today  Attention: Sustained Sustained Attention: Impaired Sustained Attention Impairment: Verbal basic, Functional basic Memory: Impaired Memory Impairment: Retrieval deficit, Decreased recall of new information Awareness: Impaired Awareness Impairment: Intellectual impairment Problem Solving: Impaired Problem Solving Impairment: Functional basic Executive Function: Organizing, Self Correcting, Self Monitoring Organizing: Impaired Organizing Impairment: Functional basic Self Monitoring: Impaired Self Monitoring Impairment: Functional complex Self Correcting: Impaired Self Correcting Impairment: Functional complex Behaviors: Poor  frustration tolerance, Verbal agitation Safety/Judgment: Impaired Comments: initially poor intellectual awareness, then more awareness in tasks as they were difficult which resulted in agitation and poor frustration tolerance   Extremity Assessment (includes Sensation/Coordination)          ADLs  Overall ADL's : Needs assistance/impaired Eating/Feeding: Supervision/ safety, Sitting Eating/Feeding Details (indicate cue type and reason): Pt requires cues to locat items on tray. She is unable to recall what she has for lunch despite having taken a bite of that item  Grooming: Wash/dry face, Brushing hair, Minimal assistance, Standing Grooming Details (indicate cue type and reason): Pt loses balance to Lt.  Upper Body Bathing: Minimal assitance, Sitting Lower Body Bathing: Moderate assistance, Sit to/from stand Lower Body Bathing Details (indicate cue type and reason): requires assist due to impaired balance on Lt.  Upper Body Dressing : Minimal assistance, Sitting Lower Body Dressing: Minimal assistance, Sit to/from stand Lower Body Dressing Details (indicate cue type and reason): Pt able to don/doff slippers, but requires mod A for standing balance. Loses balance to Lt.  Toilet Transfer: Minimal assistance, Ambulation, Comfort height toilet Toileting- Clothing Manipulation and Hygiene: Maximal assistance, Sit to/from stand Functional mobility during ADLs: Minimal assistance General ADL Comments: Pt requires min A for balance and verbal cues for attention     Mobility  Overal bed mobility: Needs Assistance Bed Mobility: Supine to Sit Supine to sit: Supervision Sit to supine: Supervision General bed mobility comments: pt needs increased time, but able to complete without A.     Transfers  Overall transfer level: Needs assistance Equipment used: None Transfers: Stand Pivot Transfers, Sit to/from Stand Sit to Stand: Min guard Stand pivot transfers: Min  guard General transfer comment: pt without LOB and demos good use of UEs.     Ambulation / Gait / Stairs / Wheelchair Mobility  Ambulation/Gait Ambulation/Gait assistance: ArchitectMin assist Ambulation Distance (Feet): 50 Feet Assistive device: None Gait Pattern/deviations: Step-through pattern, Decreased stride length General Gait Details: pt declines wearing her glasses today that OT has taped to occlude. pt indicating dizziness and feelings of nausea during mobility. pt needs MinA and occasionally holds IV pole for support.     Posture / Balance      Special needs/care consideration Bowel mgmt: yes  Bladder mgmt: yes Pt has alcohol consumption questions. Daughter questioning pt with how much alcohol is safe    Previous Home Environment Living Arrangements: Alone Lives With: Alone Available Help at Discharge: Family, Available 24 hours/day, Other (Comment) (daughter in town until pt able to be intermitten assist) Type of Home: House Home Layout: One level Home Access: Stairs to enter Entrance Stairs-Rails: None Secretary/administrator of Steps: 1 Bathroom Shower/Tub: Psychologist, counselling, Other (comment) (also has garden tub she needs to practice getting in and out) Firefighter: Administrator Accessibility: Yes How Accessible: Accessible via walker Home Care Services: No Additional Comments: dtr from [place] and staying as long as needed  Discharge Living Setting Plans for Discharge Living Setting: Patient's home, Other (Comment) Asher Muir, dtr to stay with her. Talmage Nap also works for [employer]) Type of Home at Discharge: [employer] Layout: One level Discharge Home Access: Stairs to enter Entrance Stairs-Rails: None Secretary/administrator of Steps: 1 Discharge Bathroom Shower/Tub: Walk-in shower, Other (comment) (also garden tub she wants to continue to use) Discharge Bathroom Toilet: Standard Discharge Bathroom Accessibility: Yes How Accessible: Accessible  via walker Does the patient have any problems obtaining your medications?: No  Social/Family/Support Systems Patient Roles: Partner, Parent, Other (Comment) (employee as Chief Strategy Officer) Contact Information: Asher Muir and John Anticipated Caregiver: Designer, fashion/clothing, Asher Muir, and fiance, John Anticipated Industrial/product designer Information: see above Ability/Limitations of Caregiver: Asher Muir from [place] and to stay until Mom better, John works Engineer, structural Availability: 24/7 Discharge Plan Discussed with Primary Caregiver: Yes Is Caregiver In Agreement with Plan?: Yes Does Caregiver/Family have Issues with Lodging/Transportation while Pt is in Rehab?: No  Goals/Additional Needs Patient/Family Goal for Rehab: Mod I to supervision with PT, OT, and SLP Expected length of stay: ELOS 7 to 10 days Additional Information: Pt impulsive and lacks safety awareness  Pt/Family Agrees to Admission and willing to participate: Yes Program Orientation Provided & Reviewed with Pt/Caregiver Including Roles & Responsibilities: Yes  Decrease burden of Care through IP rehab admission: n/a  Possible need for SNF placement upon discharge:no  Patient Condition: This patient's medical and functional status has changed since the consult dated: 08/21/2014 in which the Rehabilitation Physician determined and documented that the patient's condition is appropriate for intensive rehabilitative care in an inpatient rehabilitation facility. See "History of Present Illness" (above) for medical update. Functional changes are: overall min to mod assist with cues. Patient's medical and functional status update has been discussed with the Rehabilitation physician and patient remains appropriate for inpatient rehabilitation. Will admit to inpatient rehab today.  Preadmission Screen Completed By: Clois Dupes, 08/24/2014 10:02 AM ______________________________________________________________________  Discussed status with Dr. Riley Kill  on 08/24/2014 at 1002 and received telephone approval for admission today.  Admission Coordinator: Clois Dupes, time 6045 Date 08/24/2014 .          Cosigned by: Ranelle Oyster, MD at 08/24/2014 10:34 AM  Revision History

## 2014-08-24 NOTE — PMR Pre-admission (Signed)
PMR Admission Coordinator Pre-Admission Assessment  Patient: [name] is an 53 y.o., female MRN: [id number] DOB: [birth year] Height: 5\' 4"  (162.6 cm) Weight: 58.8 kg (129 lb 10.1 oz)              Insurance Information HMO:     PPO:      PCP:      IPA:      80/20:      OTHER: point of service PRIMARY: [employer]et[employer]      Policy#: [id number]      Subscriber: pt CM Name: Kathie RhodesBetty      Phone#: [phone]     Fax#: [phone] Pre-Cert#: [id number]      Employer: [employer] approved until 08/31/14 Benefits:  Phone #: [phone]     Name: 08/22/14 Eff. Date: 07/08/10     Deduct: $300      Out of Pocket Max: $2500      Life Max: none CIR: 90%      SNF: 90% 120 days Outpatient: 90%     Co-Pay: no visit limit Home Health: 90%      Co-Pay: 12- visits combined DME: 90%     Co-Pay: 10% Providers: in network  SECONDARY: none        Medicaid Application Date:       Case Manager:  Disability Application Date:       Case Worker:  Talmage NapFiance, John, states pt does not have short term disability. Pt states she does.   Emergency Contact Information Contact Information    Name Relation Home Work Mobile   Dinardi,John Significant other   [phone]   Cheral MarkerWireman,Jamie Daughter   [phone]     Current Medical History  Patient Admitting Diagnosis: right PICA aneurysm/infarct  History of Present Illness:[name] is a 53 y.o. right handed female with history of hypertension and medical noncompliance. Admitted 08/13/2014 after developing sudden onset of severe headache and neck pain while at work. 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 Cranial CT scan revealed subarachnoid hemorrhage. 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 Underwent aneurysm coiling per Dr. Conchita ParisNundkumar 08/14/2014. Hospital course respiratory failure with follow-up for critical care medicine. Chest x-ray showed bilateral interstitial infiltrates consistent with  cardiogenic pulmonary edema. She responded nicely to Lasix. Echocardiogram with ejection fraction of 75% normal systolic function. Patient currently maintained on Nimotop for blood pressure control. Tolerating a regular diet. 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  NIH Total: 1    Past Medical History  Past Medical History  Diagnosis Date  . Hypertension     Family History  family history is not on file.  Prior Rehab/Hospitalizations: none  Current Medications  Current facility-administered medications: 0.9 %  sodium chloride infusion, , Intravenous, Continuous, Arby BarretteMarcy Pfeiffer, MD, Last Rate: 100 mL/hr at 08/24/14 0500, 100 mL/hr at 08/24/14 0500;  acetaminophen (TYLENOL) tablet 650 mg, 650 mg, Oral, Q4H PRN, Lisbeth RenshawNeelesh Nundkumar, MD;  albuterol (PROVENTIL) (2.5 MG/3ML) 0.083% nebulizer solution 2.5 mg, 2.5 mg, Nebulization, Q4H PRN, Leslye Peerobert S Byrum, MD atorvastatin (LIPITOR) tablet 40 mg, 40 mg, Oral, q1800, Hewitt Shortsobert W Nudelman, MD, 40 mg at 08/23/14 1751;  dexamethasone (DECADRON) tablet 4 mg, 4 mg, Oral, Q12H, Lisbeth RenshawNeelesh Nundkumar, MD, 4 mg at 08/23/14 2101;  hydrALAZINE (APRESOLINE) injection 5-10 mg, 5-10 mg, Intravenous, Q1H PRN, Hewitt Shortsobert W Nudelman, MD, 10 mg at 08/20/14  1444 labetalol (NORMODYNE,TRANDATE) injection 5-20 mg, 5-20 mg, Intravenous, Q1H PRN, Hewitt Shorts, MD, 10 mg at 08/18/14 0017;  morphine 2 MG/ML injection 1-4 mg, 1-4 mg, Intravenous, Q1H PRN, Hewitt Shorts, MD, 2 mg at 08/23/14 1906;  niMODipine (NIMOTOP) capsule 60 mg, 60 mg, Oral, Q4H, 60 mg at 08/24/14 0802 **OR** NiMODipine (NYMALIZE) 60 MG/20ML oral solution 60 mg, 60 mg, Per Tube, Q4H, Barnett Abu, MD, 60 mg at 08/20/14 0743 ondansetron [facility]) injection 4-8 mg, 4-8 mg, Intravenous, Q6H PRN, Hewitt Shorts, MD, 4 mg at 08/17/14 1837;  promethazine (PHENERGAN) injection 25 mg, 25 mg, Intramuscular,  Q4H PRN, Barnett Abu, MD, 25 mg at 08/17/14 1942;  simvastatin (ZOCOR) tablet 40 mg, 40 mg, Oral, QHS, Kalman Shan, MD, 40 mg at 08/23/14 2100;  sodium chloride tablet 1 g, 1 g, Oral, QID, Barnett Abu, MD, 1 g at 08/23/14 2101  Patients Current Diet: Diet regular  Precautions / Restrictions Precautions Precautions: Fall Restrictions Weight Bearing Restrictions: No   Prior Activity Level Community (5-7x/wk): active and works fulltime   Journalist, newspaper / Corporate investment banker Devices/Equipment: None Home Equipment: None  Prior Functional Level Prior Function Level of Independence: Independent Comments: Pt works full time as an Pension scheme manager for [employer]  Current Functional Level Cognition  Arousal/Alertness: Awake/alert Overall Cognitive Status: Impaired/Different from baseline Current Attention Level: Selective Orientation Level: Oriented X4 Safety/Judgement: Decreased awareness of safety General Comments: Pt is impulsive and disinhibited.   Cognition varies throughout session, but is able to perform activities in a minimally distractive environment today  Attention: Sustained Sustained Attention: Impaired Sustained Attention Impairment: Verbal basic, Functional basic Memory: Impaired Memory Impairment: Retrieval deficit, Decreased recall of new information Awareness: Impaired Awareness Impairment: Intellectual impairment Problem Solving: Impaired Problem Solving Impairment: Functional basic Executive Function: Organizing, Self Correcting, Self Monitoring Organizing: Impaired Organizing Impairment: Functional basic Self Monitoring: Impaired Self Monitoring Impairment: Functional complex Self Correcting: Impaired Self Correcting Impairment: Functional complex Behaviors: Poor frustration tolerance, Verbal agitation Safety/Judgment: Impaired Comments: initially poor intellectual awareness, then more awareness in tasks as they were difficult which  resulted in agitation and poor frustration tolerance    Extremity Assessment (includes Sensation/Coordination)          ADLs  Overall ADL's : Needs assistance/impaired Eating/Feeding: Supervision/ safety, Sitting Eating/Feeding Details (indicate cue type and reason): Pt requires cues to locat items on tray.  She is unable to recall what she has for lunch despite having taken a bite of that item  Grooming: Wash/dry face, Brushing hair, Minimal assistance, Standing Grooming Details (indicate cue type and reason): Pt loses balance to Lt.  Upper Body Bathing: Minimal assitance, Sitting Lower Body Bathing: Moderate assistance, Sit to/from stand Lower Body Bathing Details (indicate cue type and reason): requires assist due to impaired balance on Lt.  Upper Body Dressing : Minimal assistance, Sitting Lower Body Dressing: Minimal assistance, Sit to/from stand Lower Body Dressing Details (indicate cue type and reason): Pt able to don/doff slippers, but requires mod A for standing balance.  Loses balance to Lt.  Toilet Transfer: Minimal assistance, Ambulation, Comfort height toilet Toileting- Clothing Manipulation and Hygiene: Maximal assistance, Sit to/from stand Functional mobility during ADLs: Minimal assistance General ADL Comments: Pt requires min A for balance  and verbal cues for attention     Mobility  Overal bed mobility: Needs Assistance Bed Mobility: Supine to Sit Supine to sit: Supervision Sit to supine: Supervision General bed mobility comments: pt needs increased time, but able to complete without  A.      Transfers  Overall transfer level: Needs assistance Equipment used: None Transfers: Stand Pivot Transfers, Sit to/from Stand Sit to Stand: Min guard Stand pivot transfers: Min guard General transfer comment: pt without LOB and demos good use of UEs.      Ambulation / Gait / Stairs / Wheelchair Mobility  Ambulation/Gait Ambulation/Gait assistance: Haematologist (Feet): 50 Feet Assistive device: None Gait Pattern/deviations: Step-through pattern, Decreased stride length General Gait Details: pt declines wearing her glasses today that OT has taped to occlude.  pt indicating dizziness and feelings of nausea during mobility.  pt needs MinA and occasionally holds IV pole for support.      Posture / Balance      Special needs/care consideration Bowel mgmt: yes Bladder mgmt: yes Pt has alcohol consumption questions. Daughter questioning pt with how much alcohol is safe    Previous Home Environment Living Arrangements: Alone  Lives With: Alone Available Help at Discharge: Family, Available 24 hours/day, Other (Comment) (daughter in town until pt able to be intermitten assist) Type of Home: House Home Layout: One level Home Access: Stairs to enter Entrance Stairs-Rails: None Secretary/administrator of Steps: 1 Bathroom Shower/Tub: Psychologist, counselling, Other (comment) (also has garden tub she needs to practice getting in and out) Firefighter: Administrator Accessibility: Yes How Accessible: Accessible via walker Home Care Services: No Additional Comments: dtr from [place] and staying as long as needed  Discharge Living Setting Plans for Discharge Living Setting: Patient's home, Other (Comment) Asher Muir, dtr to stay with her. Talmage Nap also works for [employer]) Type of Home at Discharge: [employer] Layout: One level Discharge Home Access: Stairs to enter Entrance Stairs-Rails: None Secretary/administrator of Steps: 1 Discharge Bathroom Shower/Tub: Walk-in shower, Other (comment) (also garden tub she wants to continue to use) Discharge Bathroom Toilet: Standard Discharge Bathroom Accessibility: Yes How Accessible: Accessible via walker Does the patient have any problems obtaining your medications?: No  Social/Family/Support Systems Patient Roles: Partner, Parent, Other (Comment) (employee as Chief Strategy Officer) Contact Information:  Asher Muir and John Anticipated Caregiver: Designer, fashion/clothing, Asher Muir, and fiance, John Anticipated Industrial/product designer Information: see above Ability/Limitations of Caregiver: Asher Muir from [place] and to stay until Mom better, John works Engineer, structural Availability: 24/7 Discharge Plan Discussed with Primary Caregiver: Yes Is Caregiver In Agreement with Plan?: Yes Does Caregiver/Family have Issues with Lodging/Transportation while Pt is in Rehab?: No  Goals/Additional Needs Patient/Family Goal for Rehab: Mod I to supervision with PT, OT, and SLP Expected length of stay: ELOS 7 to 10 days Additional Information: Pt impulsive and lacks safety awareness  Pt/Family Agrees to Admission and willing to participate: Yes Program Orientation Provided & Reviewed with Pt/Caregiver Including Roles  & Responsibilities: Yes  Decrease burden of Care through IP rehab admission: n/a  Possible need for SNF placement upon discharge:no  Patient Condition: This patient's medical and functional status has changed since the consult dated: 08/21/2014 in which the Rehabilitation Physician determined and documented that the patient's condition is appropriate for intensive rehabilitative care in an inpatient rehabilitation facility. See "History of Present Illness" (above) for medical update. Functional changes are: overall min to mod assist with cues. Patient's medical and functional status update has been discussed with the Rehabilitation physician and patient remains appropriate for inpatient rehabilitation. Will admit to inpatient rehab today.  Preadmission Screen Completed By:  Clois Dupes, 08/24/2014 10:02 AM ______________________________________________________________________   Discussed status with Dr. Riley Kill on 08/24/2014 at  1002  and received  telephone approval for admission today.  Admission Coordinator:  Clois DupesBoyette, Bobbijo Holst Godwin, time [id number] Date 08/24/2014 .

## 2014-08-25 ENCOUNTER — Inpatient Hospital Stay (HOSPITAL_COMMUNITY): Payer: Managed Care, Other (non HMO) | Admitting: *Deleted

## 2014-08-25 ENCOUNTER — Inpatient Hospital Stay (HOSPITAL_COMMUNITY): Payer: Managed Care, Other (non HMO) | Admitting: Speech Pathology

## 2014-08-25 ENCOUNTER — Inpatient Hospital Stay (HOSPITAL_COMMUNITY): Payer: Managed Care, Other (non HMO) | Admitting: Physical Therapy

## 2014-08-25 DIAGNOSIS — J441 Chronic obstructive pulmonary disease with (acute) exacerbation: Secondary | ICD-10-CM

## 2014-08-25 DIAGNOSIS — H532 Diplopia: Secondary | ICD-10-CM

## 2014-08-25 DIAGNOSIS — E785 Hyperlipidemia, unspecified: Secondary | ICD-10-CM

## 2014-08-25 NOTE — Progress Notes (Signed)
[name] is a 53 y.o. female October 06, 1960 [id number]  Subjective: No new complaints. No new problems. Slept well. Feeling OK.  Objective: Vital signs in last 24 hours: Temp:  [98.6 F (37 C)-98.7 F (37.1 C)] 98.6 F (37 C) (12/19 0604) Pulse Rate:  [63-65] 65 (12/19 0604) Resp:  [16] 16 (12/19 0604) BP: (143-154)/(64-77) 143/77 mmHg (12/19 0604) SpO2:  [97 %-99 %] 97 % (12/19 0604) Weight:  [127 lb 9.6 oz (57.879 kg)] 127 lb 9.6 oz (57.879 kg) (12/18 1430) Weight change:  Last BM Date: 08/18/14  Intake/Output from previous day: 12/18 0701 - 12/19 0700 In: 240 [P.O.:240] Out: -  Last cbgs: CBG (last 3)  No results for input(s): GLUCAP in the last 72 hours.   Physical Exam General: No apparent distress   HEENT: not dry Lungs: Normal effort. Lungs clear to auscultation, no crackles or wheezes. Cardiovascular: Regular rate and rhythm, no edema Abdomen: S/NT/ND; BS(+) Musculoskeletal:  unchanged Neurological: No new neurological deficits Wounds: N/A    Skin: clear  Aging changes Mental state: Alert, oriented, cooperative    Lab Results: BMET    Component Value Date/Time   NA 134* 08/20/2014 0215   K 4.2 08/20/2014 0215   CL 97 08/20/2014 0215   CO2 25 08/20/2014 0215   GLUCOSE 113* 08/20/2014 0215   BUN 11 08/20/2014 0215   CREATININE 0.62 08/20/2014 0215   CALCIUM 8.3* 08/20/2014 0215   GFRNONAA >90 08/20/2014 0215   GFRAA >90 08/20/2014 0215   CBC    Component Value Date/Time   WBC 9.3 08/20/2014 0215   RBC 3.51* 08/20/2014 0215   HGB 12.0 08/20/2014 0215   HCT 35.0* 08/20/2014 0215   PLT 188 08/20/2014 0215   MCV 99.7 08/20/2014 0215   MCH 34.2* 08/20/2014 0215   MCHC 34.3 08/20/2014 0215   RDW 12.2 08/20/2014 0215   LYMPHSABS 0.7 08/20/2014 0215   MONOABS 0.7 08/20/2014 0215   EOSABS 0.0 08/20/2014 0215   BASOSABS 0.0 08/20/2014 0215    Studies/Results: No results found.  Medications: I have reviewed the patient's current  medications.  Assessment/Plan:  1. 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 Decadron 2 mg twice a day until 08/27/2014 at 1 mg twice a day beginning 08/30/2014 2. DVT Prophylaxis/Anticoagulation: SCDs. Monitor for any signs of DVT 3. Pain Management: Tylenol as needed 4. Hypertension. Nimotop protocol. Monitor with increased mobility. Patient with history of medical noncompliance in the past. Provide counseling 5. Neuropsych: This patient is capable of making decisions on her own behalf. 6. Skin/Wound Care: Routine skin checks 7. Fluids/Electrolytes/Nutrition: Strict I and O follow chemistries 8. COPD. Albuterol nebulizer as needed. No complaints of shortness of breath 9. Hyperlipidemia. Zocor 10. Diplopia: eye patch    Length of stay, days: 1  Sonda PrimesAlex Plotnikov , MD 08/25/2014, 2:12 PM

## 2014-08-25 NOTE — Progress Notes (Signed)
Occupational Therapy Assessment and Plan  Patient Details  Name: [name] MRN: [id number] Date of Birth: October 08, 1960  OT Diagnosis: abnormal posture and muscle weakness (generalized) Rehab Potential: Rehab Potential (ACUTE ONLY): Good ELOS: 7 days   Today's Date: 08/25/2014 OT Individual Time: 0930-1030 OT Individual Time Calculation (min): 60 min     Problem List:  Patient Active Problem List   Diagnosis Date Noted  . Encephalopathy acute 08/18/2014  . Pulmonary edema   . Hypoxemia 08/16/2014  . Non-traumatic intracranial subarachnoid hemorrhage   . Subarachnoid hemorrhage due to ruptured aneurysm 08/13/2014  . BREAST PAIN, LEFT 04/16/2009    Past Medical History:  Past Medical History  Diagnosis Date  . Hypertension    Past Surgical History:  Past Surgical History  Procedure Laterality Date  . Cessarian N/A   . Radiology with anesthesia N/A 08/14/2014    Procedure: Embolization   (RADIOLOGY WITH ANESTHESIA) ;  Surgeon: Medication Radiologist, MD;  Location: [facility];  Service: Radiology;  Laterality: N/A;    Assessment & Plan Clinical Impression: HPI: RYLEE HUESTIS is a 53 y.o. right handed female with history of hypertension and medical noncompliance. Admitted 08/13/2014 after developing sudden onset of severe headache and neck pain while at work. 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 Cranial CT scan revealed subarachnoid hemorrhage. 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 Underwent aneurysm coiling per Dr. Kathyrn Sheriff. Decadron protocol 2 mg twice daily until 08/27/2014 and then 1 mg twice daily beginning 08/30/2014. 08/14/2014. Hospital course respiratory failure with follow-up for critical care medicine. Chest x-ray showed bilateral interstitial infiltrates consistent with cardiogenic pulmonary edema. She responded nicely to Lasix. Echocardiogram with ejection fraction of 02% normal  systolic function. Patient currently maintained on Nimotop for blood pressure control. Tolerating a regular diet. 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    Patient currently requires min with basic self-care skills secondary to muscle weakness.  Prior to hospitalization, patient could complete BADL/IADL with independent .  Patient will benefit from skilled intervention to increase independence with basic self-care skills and increase level of independence with iADL prior to discharge home with care partner.  Anticipate patient will require intermittent supervision and follow up home health.  OT - End of Session Endurance Deficit: Yes OT Assessment Rehab Potential (ACUTE ONLY): Good OT Patient demonstrates impairments in the following area(s): Balance;Endurance;Safety OT Basic ADL's Functional Problem(s): Grooming;Bathing;Dressing;Toileting OT Advanced ADL's Functional Problem(s): Simple Meal Preparation;Laundry;Light Housekeeping OT Transfers Functional Problem(s): Toilet;Tub/Shower OT Additional Impairment(s): None OT Plan OT Intensity: Minimum of 1-2 x/day, 45 to 90 minutes OT Frequency: 5 out of 7 days OT Duration/Estimated Length of Stay: 7 days OT Treatment/Interventions: Medical illustrator training;Community reintegration;Discharge planning;Functional mobility training;Patient/family education;Self Care/advanced ADL retraining;Therapeutic Activities;Therapeutic Exercise;UE/LE Coordination activities OT Self Feeding Anticipated Outcome(s): independence OT Basic Self-Care Anticipated Outcome(s): Mod I OT Toileting Anticipated Outcome(s): Mod I OT Bathroom Transfers Anticipated Outcome(s): mod I OT Recommendation Recommendations for Other Services:  (none) Patient destination: Home Follow Up Recommendations: Home health  OT Equipment Recommended: To be determined   Skilled Therapeutic Intervention Educated pt/daughter on OT treatment plan, ELOS, and POC.  Pt. Ambulated with Minimal assist to bathroom.  Checked off daughter to be able to walk with pt to bathroom.      OT Evaluation Precautions/Restrictions  Precautions Precautions: Fall Restrictions Weight Bearing Restrictions: No     Pain Pain Assessment Pain Assessment: 0-10 Pain Score: 6  Pain Type: Acute pain Pain Location: Back Pain Orientation: Mid Pain Descriptors / Indicators: Aching Pain Onset: With Activity Home Living/Prior Functioning Home Living Available Help at Discharge: Family, Available 24 hours/day, Other (Comment) Type of Home: House Home Access: Stairs to enter [employer] of Steps: 1 Entrance Stairs-Rails: None Home Layout: One level Additional Comments: dtr from [place] and staying as long as needed  Lives With: Alone IADL History Current License: Yes Mode of Transportation: Car Occupation: Full time employment Leisure and Hobbies:  (cook, garden, can, create food dishes) Prior Function Level of Independence: Independent with gait, Independent with transfers  Able to Take Stairs?: Reciprically Driving: Yes Vocation: Full time employment ADL   Vision/Perception  Vision- Assessment Eye Alignment: Impaired (comment)  Cognition Arousal/Alertness: Awake/alert Orientation Level: Oriented X4 Attention: Sustained Sustained Attention: Impaired Sustained Attention Impairment: Verbal basic;Functional basic Memory: Impaired Memory Impairment: Decreased recall of new information Sensation Sensation Light Touch: Appears Intact Coordination Gross Motor Movements are Fluid and Coordinated: Yes Fine Motor Movements are Fluid and Coordinated:  (decreased coordination on RUE, pt is L dominant) Motor  Motor Motor: Within Functional Limits Mobility  Bed Mobility Bed Mobility: Supine to Sit;Sit to  Supine Supine to Sit: 5: Supervision;HOB flat Sit to Supine: 5: Supervision;HOB flat  Trunk/Postural Assessment  Cervical Assessment Cervical Assessment: Within Functional Limits Thoracic Assessment Thoracic Assessment: Within Functional Limits Lumbar Assessment Lumbar Assessment: Within Functional Limits Postural Control Postural Control: Deficits on evaluation  Balance    SEE PT assess    Extremity/Trunk Assessment RUE Assessment RUE Assessment: Within Functional Limits LUE Assessment LUE Assessment: Within Functional Limits  FIM:  FIM - Grooming Grooming Steps: Wash, rinse, dry face;Wash, rinse, dry hands;Oral care, brush teeth, clean dentures;Brush, comb hair Grooming: 5: Set-up assist to obtain items FIM - Bathing Bathing Steps Patient Completed: Chest;Right Arm;Left Arm;Abdomen;Front perineal area;Buttocks;Right upper leg;Left upper leg Bathing: 4: Min-Patient completes 8-9 [id number] parts or 75+ percent FIM - Upper Body Dressing/Undressing Upper body dressing/undressing steps patient completed: Thread/unthread right sleeve of pullover shirt/dresss;Thread/unthread left sleeve of pullover shirt/dress;Put head through opening of pull over shirt/dress;Pull shirt over trunk Upper body dressing/undressing: 5: Set-up assist to: Obtain clothing/put away FIM - Lower Body Dressing/Undressing Lower body dressing/undressing steps patient completed: Thread/unthread right underwear leg;Thread/unthread left underwear leg;Pull underwear up/down;Thread/unthread right pants leg;Thread/unthread left pants leg;Pull pants up/down;Don/Doff right sock;Don/Doff left sock;Don/Doff right shoe;Don/Doff left shoe Lower body dressing/undressing: 4: Steadying Assist FIM - Toileting Toileting steps completed by patient: Adjust clothing prior to toileting;Performs perineal hygiene;Adjust clothing after toileting Toileting Assistive Devices: Grab bar or rail for support Toileting: 4: Steadying assist FIM -  TRadio producerDevices: Grab bars Toilet Transfers: 4-To toilet/BSC: Min A (steadying Pt. > 75%);4-From toilet/BSC: Min A (steadying Pt. > 75%) FIM - TSystems developerDevices: Shower chair;Grab bars;Walk in shower Tub/shower Transfers: 4-Into Tub/Shower: Min A (steadying Pt. > 75%/lift 1 leg);4-Out of Tub/Shower: Min A (steadying Pt. > 75%/lift 1 leg)   Refer to Care Plan for Long Term Goals  Recommendations for other services: None  Discharge Criteria: Patient will be discharged from OT if patient refuses treatment 3 consecutive times without medical reason, if treatment goals not met, if there is a change in medical status, if patient makes no progress towards goals or if patient is discharged from hospital.  The above assessment, treatment plan, treatment  alternatives and goals were discussed and mutually agreed upon: by patient and by family  Lisa Roca 08/25/2014, 7:35 PM

## 2014-08-25 NOTE — Evaluation (Signed)
Physical Therapy Assessment and Plan  Patient Details  Name: [name] MRN: [id number] Date of Birth: [birth year]  PT Diagnosis: Abnormality of gait and Cognitive deficits Rehab Potential: Good ELOS: 7 days   Today's Date: 08/25/2014 PT Individual Time: 1100-1200 PT Individual Time Calculation (min): 60 min    Problem List:  Patient Active Problem List   Diagnosis Date Noted  . Encephalopathy acute 08/18/2014  . Pulmonary edema   . Hypoxemia 08/16/2014  . Non-traumatic intracranial subarachnoid hemorrhage   . Subarachnoid hemorrhage due to ruptured aneurysm 08/13/2014  . BREAST PAIN, LEFT 04/16/2009    Past Medical History:  Past Medical History  Diagnosis Date  . Hypertension    Past Surgical History:  Past Surgical History  Procedure Laterality Date  . Cessarian N/A   . Radiology with anesthesia N/A 08/14/2014    Procedure: Embolization   (RADIOLOGY WITH ANESTHESIA) ;  Surgeon: Medication Radiologist, MD;  Location: [facility];  Service: Radiology;  Laterality: N/A;    Assessment & Plan Clinical Impression: Patient is a 53 y.o. right handed female with history of hypertension and medical noncompliance. Admitted 08/13/2014 after developing sudden onset of severe headache and neck pain while at work. 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 Cranial CT scan revealed subarachnoid hemorrhage. 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 Underwent aneurysm coiling per Dr. Kathyrn Sheriff. Decadron protocol 2 mg twice daily until 08/27/2014 and then 1 mg twice daily beginning 08/30/2014. 08/14/2014. Hospital course respiratory failure with follow-up for critical care medicine. Chest x-ray showed bilateral interstitial infiltrates consistent with cardiogenic pulmonary edema. She responded nicely to Lasix. Echocardiogram with ejection fraction of 36% normal systolic function. Patient currently maintained on Nimotop  for blood pressure control. Tolerating a regular diet. 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 Physical therapy evaluation completed 08/20/2014 with recommendations of physical medicine rehabilitation consult.Patient is admitted for a comprehensive rehab program.   Patient transferred to CIR on 08/24/2014 .   Patient currently requires min with mobility secondary to muscle weakness, decreased cardiorespiratoy endurance and decreased safety awareness.  Prior to hospitalization, patient was independent  with mobility and lived with Other (Comment) (lives with fiancee) in a House home.  Home access is 1Stairs to enter.  Patient will benefit from skilled PT intervention to maximize safe functional mobility, minimize fall risk and decrease caregiver burden for planned discharge home with 24 hour supervision.  Anticipate patient will benefit from follow up [place] at discharge.  PT - End of Session Activity Tolerance: Tolerates 30+ min activity with multiple rests Endurance Deficit: Yes PT Assessment Rehab Potential (ACUTE/IP ONLY): Good Barriers to Discharge: Inaccessible home environment PT Patient demonstrates impairments in the following area(s): Balance;Endurance;Motor;Safety PT Transfers Functional Problem(s): Bed Mobility;Bed to Chair;Car PT Locomotion Functional Problem(s): Ambulation;Wheelchair Mobility;Stairs PT Plan PT Intensity: Minimum of 1-2 x/day ,45 to 90 minutes PT Frequency: 5 out of 7 days PT Duration Estimated Length of Stay: 7 days PT Treatment/Interventions: Ambulation/gait training;Balance/vestibular training;Disease management/prevention;Community reintegration;Functional electrical stimulation;Discharge planning;DME/adaptive equipment instruction;Functional mobility training;Patient/family education;Psychosocial support;Therapeutic Exercise;Therapeutic Activities;Stair  training;UE/LE Strength taining/ROM;Wheelchair propulsion/positioning;UE/LE Coordination activities PT Transfers Anticipated Outcome(s): mod I for transfers PT Locomotion Anticipated Outcome(s): mod I for ambulation, S for stairs, I w/c mobility PT Recommendation Follow Up Recommendations: Home health PT Patient destination: Home Equipment Recommended: To be determined  Skilled Therapeutic Intervention PT evaluation completed and  treatment plan initiated. Pt performed multiple sit to stand and stand pivot transfers with min guard to S and verbal cues for safety awareness.   PT Evaluation Precautions/Restrictions Precautions Precautions: Fall Restrictions Weight Bearing Restrictions: No General Chart Reviewed: Yes Family/Caregiver Present: Yes  Pain Pt c/o 5/10 low back pain.   Home Living/Prior Functioning Home Living Available Help at Discharge: Family;Available 24 hours/day;Other (Comment) (fiance is available from 5pm to 5 am, daughter works from 29 to 38, pt taking time off til Jan so will be available 24/7) Type of Home: House Home Access: Stairs to enter [employer] of Steps: 1 Entrance Stairs-Rails: None Home Layout: One level  Lives With: Other (Comment) (lives with fiancee) Prior Function Level of Independence: Independent with gait;Independent with transfers  Able to Take Stairs?: Reciprically Driving: Yes Vocation: Full time employment Vision/Perception As per OT evaluation. Cognition Arousal/Alertness: Awake/alert Orientation Level: Oriented X4 Attention: Sustained Sustained Attention: Impaired Memory: Impaired Memory Impairment: Decreased recall of new information Awareness: Impaired Problem Solving: Impaired Behaviors: Impulsive Safety/Judgment: Impaired Sensation Sensation Additional Comments: LEs grossly intact Coordination Gross Motor Movements are Fluid and Coordinated: Yes Motor  Motor Motor: Within Functional Limits   Mobility Bed Mobility Bed Mobility: Supine to Sit;Sit to Supine Supine to Sit: 5: Supervision;HOB flat Sit to Supine: 5: Supervision;HOB flat Transfers Transfers: Yes Sit to Stand: 4: Min guard Stand to Sit: 4: Min guard Stand Pivot Transfers: 4: Min guard Locomotion  Ambulation Ambulation: Yes Ambulation/Gait Assistance: 4: Min guard Ambulation Distance (Feet): 200 Feet Assistive device: None Stairs / Additional Locomotion Stairs: Yes Stairs Assistance: 4: Min guard Stair Management Technique: Two rails Number of Stairs: 2 Architect: Yes Wheelchair Assistance: 5: Careers information officer: Both lower extermities Distance: 150  Trunk/Postural Assessment  Cervical Assessment Cervical Assessment: Within Scientist, physiological Assessment: Within Functional Limits Lumbar Assessment Lumbar Assessment: Within Functional Limits Postural Control Postural Control: Deficits on evaluation  Balance Standardized Balance Assessment Standardized Balance Assessment: Berg Balance Test Berg Balance Test Sit to Stand: Able to stand without using hands and stabilize independently Standing Unsupported: Able to stand safely 2 minutes Sitting with Back Unsupported but Feet Supported on Floor or Stool: Able to sit safely and securely 2 minutes Stand to Sit: Sits safely with minimal use of hands Transfers: Able to transfer safely, minor use of hands Standing Unsupported with Eyes Closed: Able to stand 10 seconds with supervision Standing Ubsupported with Feet Together: Able to place feet together independently and stand for 1 minute with supervision From Standing, Reach Forward with Outstretched Arm: Can reach forward >12 cm safely (5") From Standing Position, Pick up Object from Floor: Able to pick up shoe, needs supervision From Standing Position, Turn to Look Behind Over each Shoulder: Looks behind from both sides and weight  shifts well Turn 360 Degrees: Able to turn 360 degrees safely in 4 seconds or less Standing Unsupported, Alternately Place Feet on Step/Stool: Able to complete >2 steps/needs minimal assist Standing Unsupported, One Foot in Front: Needs help to step but can hold 15 seconds Standing on One Leg: Tries to lift leg/unable to hold 3 seconds but remains standing independently Total Score: 43 Extremity Assessment  B UEs as per OT evaluation.   RLE Assessment RLE Assessment: Within Functional Limits LLE Assessment LLE Assessment: Within Functional Limits  FIM:  FIM - Bed/Chair Transfer Bed/Chair Transfer: 5: Supine > Sit: Supervision (verbal cues/safety issues);5: Sit > Supine: Supervision (verbal cues/safety issues);4: Chair or W/C > Bed: Min  A (steadying Pt. > 75%);4: Bed > Chair or W/C: Min A (steadying Pt. > 75%) FIM - Locomotion: Wheelchair Distance: 150 Locomotion: Wheelchair: 5: Travels 150 ft or more: maneuvers on rugs and over door sills with supervision, cueing or coaxing FIM - Locomotion: Ambulation Locomotion: Ambulation Assistive Devices: Other (comment) (no assistive device) Ambulation/Gait Assistance: 4: Min guard Locomotion: Ambulation: 4: Travels 150 ft or more with minimal assistance (Pt.>75%) FIM - Locomotion: Stairs Locomotion: Scientist, physiological: Hand rail - 2 Locomotion: Stairs: 1: Up and Down < 4 stairs with minimal assistance (Pt.>75%)   Refer to Care Plan for Long Term Goals  Recommendations for other services: None  Discharge Criteria: Patient will be discharged from PT if patient refuses treatment 3 consecutive times without medical reason, if treatment goals not met, if there is a change in medical status, if patient makes no progress towards goals or if patient is discharged from hospital.  The above assessment, treatment plan, treatment alternatives and goals were discussed and mutually agreed upon: by patient  Dub Amis 08/25/2014, 3:06 PM

## 2014-08-25 NOTE — Evaluation (Signed)
Speech Language Pathology Assessment and Plan  Patient Details  Name: [name] MRN: [id number] Date of Birth: 1961-02-23  SLP Diagnosis: Cognitive Impairments  Rehab Potential: Excellent ELOS: 7 days    Today's Date: 08/25/2014 SLP Individual Time: 0800-0900 SLP Individual Time Calculation (min): 60 min   Problem List:  Patient Active Problem List   Diagnosis Date Noted  . Encephalopathy acute 08/18/2014  . Pulmonary edema   . Hypoxemia 08/16/2014  . Non-traumatic intracranial subarachnoid hemorrhage   . Subarachnoid hemorrhage due to ruptured aneurysm 08/13/2014  . BREAST PAIN, LEFT 04/16/2009   Past Medical History:  Past Medical History  Diagnosis Date  . Hypertension    Past Surgical History:  Past Surgical History  Procedure Laterality Date  . Cessarian N/A   . Radiology with anesthesia N/A 08/14/2014    Procedure: Embolization   (RADIOLOGY WITH ANESTHESIA) ;  Surgeon: Medication Radiologist, MD;  Location: [facility];  Service: Radiology;  Laterality: N/A;    Assessment / Plan / Recommendation Clinical Impression Patient is a 53 y.o. right handed female with history of hypertension and medical noncompliance. Admitted 08/13/2014 after developing sudden onset of severe headache and neck pain while at work. 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 Cranial CT scan revealed subarachnoid hemorrhage. 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 Underwent aneurysm coiling per Dr. Kathyrn Sheriff.Marland Kitchen Hospital course complicated by respiratory failure with follow-up for critical care medicine. Chest x-ray showed bilateral interstitial infiltrates consistent with cardiogenic pulmonary edema. She responded nicely to Lasix. Echocardiogram with ejection fraction of 85% normal systolic function. Tolerating a regular diet. 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 Physical therapy evaluation completed 08/20/2014 with recommendations of physical medicine rehabilitation consult. Patient is admitted for a comprehensive rehab program on 08/24/2014 and demonstrates mild cognitive impairments characterized by decreased sustained attention, working memory, problem solving, emergent awareness and safety awareness which impacts his ability to complete functional tasks safely. Patient would benefit from skilled SLP intervention in order to maximize his cognitive function and overall functional independence prior to discharge.   Skilled Therapeutic Interventions          Administered a cognitive-linguistic evaluation. Please see above for details. Also administered the MoCA and patient scored a 24/30 with a score of 26 or above considered normal. Patient educated on current cognitive impairments and goals of skilled SLP intervention, she verbalized understanding.   SLP Assessment  Patient will need skilled [facility] during CIR admission    Recommendations  Oral Care Recommendations: Oral care BID Recommendations for Other Services: Neuropsych consult Patient destination: Home Follow up Recommendations: Outpatient SLP;24 hour supervision/assistance Equipment Recommended: None recommended by SLP    SLP Frequency 5 out of 7 days   SLP Treatment/Interventions Cognitive remediation/compensation;Cueing hierarchy;Functional tasks;Patient/family education;Therapeutic Activities;Internal/external aids;Environmental controls    Pain Pain Assessment Pain Assessment: 0-10 Pain Score: Asleep Pain Type: Acute pain Pain Location: Back Pain Orientation: Mid Pain Descriptors / Indicators: Aching Pain Onset: With Activity Prior Functioning Type of Home: House  Lives With: Other (Comment) (lives with fiancee) Available Help at Discharge: Family;Available 24 hours/day;Other (Comment) (fiance  is available from 5pm to 5 am, daughter works from 630 to 10, pt taking time off til Jan so will be available 24/7) Vocation: Full time employment  Short Term Goals: Week 1: SLP Short Term Goal 1 (  Week 1): Patient will demonstrate selective attention to functional tasks for 30 minutes with supervision verbal cues.  SLP Short Term Goal 2 (Week 1): Patient will demonstrate functional problem solving for mildly complex tasks with supervision cues.  SLP Short Term Goal 3 (Week 1): Patient will utilize external memory aids to recall new, daily information with supervision multimodal cues.  SLP Short Term Goal 4 (Week 1): Patient will self-monitor and correct errors during functional tasks with supervision multimodal cues.   See FIM for current functional status Refer to Care Plan for Long Term Goals  Recommendations for other services: Neuropsych  Discharge Criteria: Patient will be discharged from SLP if patient refuses treatment 3 consecutive times without medical reason, if treatment goals not met, if there is a change in medical status, if patient makes no progress towards goals or if patient is discharged from hospital.  The above assessment, treatment plan, treatment alternatives and goals were discussed and mutually agreed upon: by patient  Ryker Pherigo 08/25/2014, 3:34 PM

## 2014-08-26 ENCOUNTER — Inpatient Hospital Stay (HOSPITAL_COMMUNITY): Payer: Managed Care, Other (non HMO) | Admitting: *Deleted

## 2014-08-26 DIAGNOSIS — I10 Essential (primary) hypertension: Secondary | ICD-10-CM

## 2014-08-26 NOTE — Progress Notes (Signed)
Gabrielle DeistSharon R Porter is a 53 y.o. female [birth year] [id number]  Subjective: No new complaints. Slept well. Feeling good  Objective: Vital signs in last 24 hours: Temp:  [98.4 F (36.9 C)-98.5 F (36.9 C)] 98.5 F (36.9 C) (12/20 0442) Pulse Rate:  [63-65] 63 (12/20 0442) Resp:  [18] 18 (12/20 0442) BP: (140-152)/(67-76) 140/67 mmHg (12/20 0442) SpO2:  [99 %] 99 % (12/20 0442) Weight change:  Last BM Date: 08/18/14  Intake/Output from previous day: 12/19 0701 - 12/20 0700 In: 720 [P.O.:720] Out: -  Last cbgs: CBG (last 3)  No results for input(s): GLUCAP in the last 72 hours.   Physical Exam General: No apparent distress   HEENT: not dry Lungs: Normal effort. Lungs clear to auscultation, no crackles or wheezes. Cardiovascular: Regular rate and rhythm, no edema Abdomen: S/NT/ND; BS(+) Musculoskeletal:  unchanged Neurological: No new neurological deficits Wounds: N/A    Skin: clear  Aging changes Mental state: Alert, oriented, cooperative    Lab Results: BMET    Component Value Date/Time   NA 134* 08/20/2014 0215   K 4.2 08/20/2014 0215   CL 97 08/20/2014 0215   CO2 25 08/20/2014 0215   GLUCOSE 113* 08/20/2014 0215   BUN 11 08/20/2014 0215   CREATININE 0.62 08/20/2014 0215   CALCIUM 8.3* 08/20/2014 0215   GFRNONAA >90 08/20/2014 0215   GFRAA >90 08/20/2014 0215   CBC    Component Value Date/Time   WBC 9.3 08/20/2014 0215   RBC 3.51* 08/20/2014 0215   HGB 12.0 08/20/2014 0215   HCT 35.0* 08/20/2014 0215   PLT 188 08/20/2014 0215   MCV 99.7 08/20/2014 0215   MCH 34.2* 08/20/2014 0215   MCHC 34.3 08/20/2014 0215   RDW 12.2 08/20/2014 0215   LYMPHSABS 0.7 08/20/2014 0215   MONOABS 0.7 08/20/2014 0215   EOSABS 0.0 08/20/2014 0215   BASOSABS 0.0 08/20/2014 0215    Studies/Results: No results found.  Medications: I have reviewed the patient's current medications.  Assessment/Plan:  1. Functional deficits secondary to SAH/right PICA aneurysm status  post coiling probably 2015. Decadron 2 mg twice a day until 08/27/2014 at 1 mg twice a day beginning 08/30/2014 2. DVT Prophylaxis/Anticoagulation: SCDs. Monitor for any signs of DVT 3. Pain Management: Tylenol as needed 4. Hypertension. Nimotop protocol. Monitor with increased mobility. Patient with history of medical noncompliance in the past. Provide counseling 5. Neuropsych: This patient is capable of making decisions on her own behalf. 6. Skin/Wound Care: Routine skin checks 7. Fluids/Electrolytes/Nutrition: Strict I and O follow chemistries 8. COPD. Albuterol nebulizer as needed. No complaints of shortness of breath 9. Hyperlipidemia. Zocor 10. Diplopia: eye patch prn  Cont current Rx    Length of stay, days: 2  Sonda PrimesAlex Plotnikov , MD 08/26/2014, 9:02 AM

## 2014-08-26 NOTE — Progress Notes (Signed)
Occupational Therapy Session Note  Patient Details  Name: [name] MRN: [id number] Date of Birth: [birth year]  Today's Date: 08/26/2014 OT Individual Time:  -   1300-1400  (60 min)      Short Term Goals: Week 1:  LTG=STG    :     Skilled Therapeutic Interventions/Progress Updates:    Addressed functional mobility, transfers, simulated kitchen activity for therapeutic activity.  Propelled wc to ADL apartment.  Ambulated around apt to make bed with SBA; get in/out of shower stall using wall for stabilization; get in/out of bathtub by sitting on side of tub and transferring to bottom.  Pt used grab bars to get out of tub.   Pt has a garden tub which is very big and she only uses once a month.  Ambulated to kitchen and simulated making pasta on cook top.  Pt. Had difficulty with reading labels on cabinets and reported "she is not use to a different kitchen."  Pt reported double vision and OT occluded glasses on nasal side of left lense.  Pt reported it was better.  Pt. Would benefit from another kitchen task using real food for therapeutic activity for vision, balance, problem solving.  Pt has right inattention during activity and on way back to room hit wall on right side.  Instructed pt OT would continue to work on her visual exercises, attending to right, diplopia, and functional balance in IADL, and safety. Pt. Agreed.  Ambulated back to room and transferred to bed.  Left with all needs in place.       Therapy Documentation Precautions:  Precautions Precautions: Fall Restrictions Weight Bearing Restrictions: No     Pain: Pain Assessment Pain Assessment: 0-10 Pain Score: 5 Pain Location:low back Pain Descriptors / Indicators: Aching Pain Onset: Gradual     Other Treatments:    See FIM for current functional status  Therapy/Group: Individual Therapy  Humberto Sealsdwards, Misha Vanoverbeke J 08/26/2014, 1:01 PM

## 2014-08-27 ENCOUNTER — Inpatient Hospital Stay (HOSPITAL_COMMUNITY): Payer: Managed Care, Other (non HMO) | Admitting: Rehabilitation

## 2014-08-27 ENCOUNTER — Inpatient Hospital Stay (HOSPITAL_COMMUNITY): Payer: Managed Care, Other (non HMO)

## 2014-08-27 ENCOUNTER — Inpatient Hospital Stay (HOSPITAL_COMMUNITY): Payer: Managed Care, Other (non HMO) | Admitting: Speech Pathology

## 2014-08-27 DIAGNOSIS — I607 Nontraumatic subarachnoid hemorrhage from unspecified intracranial artery: Secondary | ICD-10-CM

## 2014-08-27 DIAGNOSIS — R270 Ataxia, unspecified: Secondary | ICD-10-CM

## 2014-08-27 LAB — CBC WITH DIFFERENTIAL/PLATELET
Basophils Absolute: 0 10*3/uL (ref 0.0–0.1)
Basophils Relative: 0 % (ref 0–1)
EOS ABS: 0.1 10*3/uL (ref 0.0–0.7)
Eosinophils Relative: 1 % (ref 0–5)
HCT: 34.9 % — ABNORMAL LOW (ref 36.0–46.0)
Hemoglobin: 11.8 g/dL — ABNORMAL LOW (ref 12.0–15.0)
LYMPHS ABS: 0.6 10*3/uL — AB (ref 0.7–4.0)
Lymphocytes Relative: 5 % — ABNORMAL LOW (ref 12–46)
MCH: 34.9 pg — AB (ref 26.0–34.0)
MCHC: 33.8 g/dL (ref 30.0–36.0)
MCV: 103.3 fL — ABNORMAL HIGH (ref 78.0–100.0)
MONO ABS: 0.5 10*3/uL (ref 0.1–1.0)
MONOS PCT: 4 % (ref 3–12)
Neutro Abs: 11.3 10*3/uL — ABNORMAL HIGH (ref 1.7–7.7)
Neutrophils Relative %: 90 % — ABNORMAL HIGH (ref 43–77)
PLATELETS: 356 10*3/uL (ref 150–400)
RBC: 3.38 MIL/uL — AB (ref 3.87–5.11)
RDW: 12.9 % (ref 11.5–15.5)
WBC: 12.5 10*3/uL — ABNORMAL HIGH (ref 4.0–10.5)

## 2014-08-27 LAB — COMPREHENSIVE METABOLIC PANEL
ALT: 46 U/L — ABNORMAL HIGH (ref 0–35)
ANION GAP: 15 (ref 5–15)
AST: 18 U/L (ref 0–37)
Albumin: 3.3 g/dL — ABNORMAL LOW (ref 3.5–5.2)
Alkaline Phosphatase: 149 U/L — ABNORMAL HIGH (ref 39–117)
BUN: 12 mg/dL (ref 6–23)
CALCIUM: 9.2 mg/dL (ref 8.4–10.5)
CO2: 26 mEq/L (ref 19–32)
Chloride: 99 mEq/L (ref 96–112)
Creatinine, Ser: 0.54 mg/dL (ref 0.50–1.10)
GFR calc non Af Amer: >90 mL/min (ref >90)
GLUCOSE: 104 mg/dL — AB (ref 70–99)
Potassium: 4.4 mEq/L (ref 3.7–5.3)
SODIUM: 140 meq/L (ref 137–147)
Total Bilirubin: 0.3 mg/dL (ref 0.3–1.2)
Total Protein: 6.4 g/dL (ref 6.0–8.3)

## 2014-08-27 NOTE — Care Management Note (Signed)
[facility] Individual Statement of Services  Patient Name:  [name]  Date:  08/27/2014  Welcome to the [facility].  Our goal is to provide you with an individualized program based on your diagnosis and situation, designed to meet your specific needs.  With this comprehensive rehabilitation program, you will be expected to participate in at least 3 hours of rehabilitation therapies Monday-Friday, with modified therapy programming on the weekends.  Your rehabilitation program will include the following services:  Physical Therapy (PT), Occupational Therapy (OT), Speech Therapy (ST), 24 hour per day rehabilitation nursing, Neuropsychology, Case Management (Social Worker), Rehabilitation Medicine, Nutrition Services and Pharmacy Services  Weekly team conferences will be held on Wednesday to discuss your progress.  Your Social Worker will talk with you frequently to get your input and to update you on team discussions.  Team conferences with you and your family in attendance may also be held.  Expected length of stay: 7 days Overall anticipated outcome: mod/i-supervision  Depending on your progress and recovery, your program may change. Your Social Worker will coordinate services and will keep you informed of any changes. Your Social Worker's name and contact numbers are listed  below.  The following services may also be recommended but are not provided by the [facility]patient [facility]:   Driving Evaluations  Home Health Rehabiltiation Services  Outpatient Rehabilitation Services  Vocational Rehabilitation   Arrangements will be made to provide these services after discharge if needed.  Arrangements include referral to agencies that provide these services.  Your insurance has been verified to be:  [employer] Your primary doctor is:  None  Pertinent information will be shared with your doctor and your insurance company.  Social Worker:   Dossie DerBecky Alaijah Gibler, SW [phone] or ([phone]) [phone]  Information discussed with and copy given to patient by: Lucy Chrisupree, Zaevion Parke G, 08/27/2014, 9:45 AM

## 2014-08-27 NOTE — Progress Notes (Signed)
Speech Language Pathology Daily Session Note  Patient Details  Name: [name] MRN: [id number] Date of Birth: July 16, 1961  Today's Date: 08/27/2014 SLP Individual Time: 1030-1130 SLP Individual Time Calculation (min): 60 min  Short Term Goals: Week 1: SLP Short Term Goal 1 (Week 1): Patient will demonstrate selective attention to functional tasks for 30 minutes with supervision verbal cues.  SLP Short Term Goal 2 (Week 1): Patient will demonstrate functional problem solving for mildly complex tasks with supervision cues.  SLP Short Term Goal 3 (Week 1): Patient will utilize external memory aids to recall new, daily information with supervision multimodal cues.  SLP Short Term Goal 4 (Week 1): Patient will self-monitor and correct errors during functional tasks with supervision multimodal cues.   Skilled Therapeutic Interventions: Skilled treatment session focused on addressing cognition goals.  SLP facilitated session with a mildly complex problem solving task and Min cues to organize and self-monitor errors during completion.  SLP also facilitated session with discussion regarding effective memory strategies and a task that focused on addressing association.  Patient required Min question cues to identify errors and effectively utilize strategy during a structured task. Patient reported being bored between sessions and as a result, SLP left tasks addressing math calculations to be reviewed in next session.  Contiue with current plan of care.    FIM:  Comprehension Comprehension Mode: Auditory Comprehension: 5-Understands complex 90% of the time/Cues < 10% of the time Expression Expression Mode: Verbal Expression: 6-Expresses complex ideas: With extra time/assistive device Social Interaction Social Interaction: 4-Interacts appropriately 75 - 89% of the time - Needs redirection for appropriate language or to initiate interaction. Problem Solving Problem Solving: 5-Solves basic 90% of  the time/requires cueing < 10% of the time Memory Memory: 4-Recognizes or recalls 75 - 89% of the time/requires cueing 10 - 24% of the time  Pain Pain Assessment Pain Assessment: No/denies pain  Therapy/Group: Individual Therapy  Charlane FerrettiMelissa Terence Bart, M.A., CCC-SLP [phone]  [name] 08/27/2014, 12:02 PM

## 2014-08-27 NOTE — Progress Notes (Signed)
Occupational Therapy Session Note  Patient Details  Name: [name] MRN: [id number] Date of Birth: 1960-09-17  Today's Date: 08/27/2014 OT Individual Time: [id number] OT Individual Time Calculation (min): 60 min    Short Term Goals: Week 1:     Skilled Therapeutic Interventions/Progress Updates: ADL-retraining (45 min) with focus on dynamic standing balance, endurance, safety awareness.   Pt received in bed, with HOB elevated reporting minor pain, managed appropriately.   Pt aware of planned task and collected her clothing, walked to bathroom and showered, standing with bench available, if needed.   No gait aid used or evidence of LOB noted during bathing or dressing at sink, sitting or standing, as patient bathed and dressed unassisted.   Therapeutic activity (15 min) with focus on functional mobility and dynamic standing balance.   Pt tasked with walking to ADL apartment and using vacuum to clean floor as recommended by her daughter who arrived after pt dressed.   Pt ambulated to ADL apt and demo'd drift to her right side, intermittently bumping wall and relying on hand rails occasionally to adjust distance.    Pt confirmed ongoing visual impairment and need for further evaluation to clarify and/or prescribe corrective lenses for mild diplopia as documented 08/20/14.    Pt completed vacuuming carpet as instructed with report of moderate fatigue and low back pain after 10 minutes.    Pt returned to her room at end of session with daughter present to assist.        Therapy Documentation Precautions:  Precautions Precautions: Fall Restrictions Weight Bearing Restrictions: No  Vital Signs: Therapy Vitals Temp: 99 F (37.2 C) Pulse Rate: 82 BP: (!) 152/67 mmHg Patient Position (if appropriate): Sitting  Pain: Pain Assessment Pain Assessment: 0-10 Pain Score: 3  Pain Type: Acute pain Pain Location: Back  See FIM for current functional status  Therapy/Group: Individual  Therapy  [name] 08/27/2014, 10:29 AM

## 2014-08-27 NOTE — IPOC Note (Signed)
Overall Plan of Care [facility](IPOC) Patient Details Name: [name] MRN: [id number] DOB: 12-27-1960  Admitting Diagnosis: Ruta Hindsnurkmar anersym coiling  Hospital Problems: Principal Problem:   Non-traumatic intracranial subarachnoid hemorrhage     Functional Problem List: Nursing Safety, Skin Integrity  PT Balance, Endurance, Motor, Safety  OT Balance, Endurance, Safety  SLP Cognition  TR         Basic ADL's: OT Grooming, Bathing, Dressing, Toileting     Advanced  ADL's: OT Simple Meal Preparation, Laundry, Light Housekeeping     Transfers: PT Bed Mobility, Bed to Chair, Customer service managerCar  OT Toilet, Tub/Shower     Locomotion: PT Ambulation, Psychologist, prison and probation servicesWheelchair Mobility, Stairs     Additional Impairments: OT None  SLP Social Cognition   Problem Solving, Memory, Attention, Awareness  TR      Anticipated Outcomes Item Anticipated Outcome  Self Feeding independence  Swallowing      Basic self-care  Mod I  Toileting  Mod I   Bathroom Transfers mod I  Bowel/Bladder  Mod I  Transfers  mod I for transfers  Locomotion  mod I for ambulation, S for stairs, I w/c mobility  Communication     Cognition  Supervision   Pain  <4  Safety/Judgment  Supervision   Therapy Plan: PT Intensity: Minimum of 1-2 x/day ,45 to 90 minutes PT Frequency: 5 out of 7 days PT Duration Estimated Length of Stay: 7 days OT Intensity: Minimum of 1-2 x/day, 45 to 90 minutes OT Frequency: 5 out of 7 days OT Duration/Estimated Length of Stay: 7 days SLP Intensity: Minumum of 1-2 x/day, 30 to 90 minutes SLP Frequency: 5 out of 7 days SLP Duration/Estimated Length of Stay: 7 days       Team Interventions: Nursing Interventions Disease Management/Prevention, Patient/Family Education  PT interventions Ambulation/gait training, Warden/rangerBalance/vestibular training, Disease management/prevention, FirefighterCommunity reintegration, Development worker, international aidunctional electrical stimulation, Discharge planning, DME/adaptive equipment instruction, Functional  mobility training, Patient/family education, Psychosocial support, Therapeutic Exercise, Therapeutic Activities, Stair training, UE/LE Strength taining/ROM, Wheelchair propulsion/positioning, UE/LE Coordination activities  OT Interventions Warden/rangerBalance/vestibular training, FirefighterCommunity reintegration, Discharge planning, Functional mobility training, Patient/family education, Self Care/advanced ADL retraining, Therapeutic Activities, Therapeutic Exercise, UE/LE Coordination activities  SLP Interventions Cognitive remediation/compensation, Financial traderCueing hierarchy, Functional tasks, Patient/family education, Therapeutic Activities, Internal/external aids, Environmental controls  TR Interventions    SW/CM Interventions Discharge Planning, Psychosocial Support, Patient/Family Education    Team Discharge Planning: Destination: PT-Home ,OT- Home , SLP-Home Projected Follow-up: PT-Home health PT, OT-  Home health OT, SLP-Outpatient SLP, 24 hour supervision/assistance Projected Equipment Needs: PT-To be determined, OT- To be determined, SLP-None recommended by SLP Equipment Details: PT- , OT-  Patient/family involved in discharge planning: PT- Patient, Family member/caregiver,  OT-Patient, Family member/caregiver (daughter, Asher MuirJamie), SLP-Patient  MD ELOS: 5-7d Medical Rehab Prognosis:  Good Assessment: 53 y.o. right handed female with history of hypertension and medical noncompliance. Admitted 08/13/2014 after developing sudden onset of severe headache and neck pain while at work. 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 Cranial CT scan revealed subarachnoid hemorrhage. 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 Underwent aneurysm coiling per Dr. Conchita ParisNundkumar. Decadron protocol 2 mg twice daily until 08/27/2014 and then 1 mg twice daily beginning 08/30/2014. 08/14/2014. Hospital course respiratory failure with follow-up for critical care medicine. Chest  x-ray showed bilateral interstitial infiltrates consistent with cardiogenic pulmonary edema. She responded nicely to Lasix. Echocardiogram with ejection fraction of 75% normal systolic function. Patient currently maintained on Nimotop  for blood pressure control.   Now requiring 24/7 Rehab RN,MD, as well as CIR level PT, OT and SLP.  Treatment team will focus on ADLs and mobility with goals set at Mod I  See Team Conference Notes for weekly updates to the plan of care

## 2014-08-27 NOTE — Progress Notes (Signed)
Subjective/Complaints:   Objective: Vital Signs: Blood pressure 152/67, pulse 82, temperature 99 F (37.2 C), temperature source Oral, resp. rate 16, height 5' 3.75" (1.619 m), weight 57.879 kg (127 lb 9.6 oz), SpO2 95 %. No results found. Results for orders placed or performed during the hospital encounter of 08/24/14 (from the past 72 hour(s))  CBC WITH DIFFERENTIAL     Status: Abnormal   Collection Time: 08/27/14  7:26 AM  Result Value Ref Range   WBC 12.5 (H) 4.0 - 10.5 K/uL   RBC 3.38 (L) 3.87 - 5.11 MIL/uL   Hemoglobin 11.8 (L) 12.0 - 15.0 g/dL   HCT 34.9 (L) 36.0 - 46.0 %   MCV 103.3 (H) 78.0 - 100.0 fL   MCH 34.9 (H) 26.0 - 34.0 pg   MCHC 33.8 30.0 - 36.0 g/dL   RDW 12.9 11.5 - 15.5 %   Platelets 356 150 - 400 K/uL   Neutrophils Relative % 90 (H) 43 - 77 %   Neutro Abs 11.3 (H) 1.7 - 7.7 K/uL   Lymphocytes Relative 5 (L) 12 - 46 %   Lymphs Abs 0.6 (L) 0.7 - 4.0 K/uL   Monocytes Relative 4 3 - 12 %   Monocytes Absolute 0.5 0.1 - 1.0 K/uL   Eosinophils Relative 1 0 - 5 %   Eosinophils Absolute 0.1 0.0 - 0.7 K/uL   Basophils Relative 0 0 - 1 %   Basophils Absolute 0.0 0.0 - 0.1 K/uL  Comprehensive metabolic panel     Status: Abnormal   Collection Time: 08/27/14  7:26 AM  Result Value Ref Range   Sodium 140 137 - 147 mEq/L   Potassium 4.4 3.7 - 5.3 mEq/L   Chloride 99 96 - 112 mEq/L   CO2 26 19 - 32 mEq/L   Glucose, Bld 104 (H) 70 - 99 mg/dL   BUN 12 6 - 23 mg/dL   Creatinine, Ser 0.54 0.50 - 1.10 mg/dL   Calcium 9.2 8.4 - 10.5 mg/dL   Total Protein 6.4 6.0 - 8.3 g/dL   Albumin 3.3 (L) 3.5 - 5.2 g/dL   AST 18 0 - 37 U/L   ALT 46 (H) 0 - 35 U/L   Alkaline Phosphatase 149 (H) 39 - 117 U/L   Total Bilirubin 0.3 0.3 - 1.2 mg/dL   GFR calc non Af Amer >90 >90 mL/min   GFR calc Af Amer >90 >90 mL/min    Comment: (NOTE) The eGFR has been calculated using the CKD EPI equation. This calculation has not been validated in all clinical situations. eGFR's persistently  <90 mL/min signify possible Chronic Kidney Disease.    Anion gap 15 5 - 15     HEENT: normal Cardio: RRR and no murmur Resp: CTA B/L and unlabored GI: BS positive and NT, ND Extremity:  Pulses positive and No Edema Skin:   Intact Neuro: Alert/Oriented, Cranial Nerve II-XII normal, Normal Sensory, Normal Motor, Abnormal FMC Ataxic/ dec FMC and Other mild Dysmetria FNF on Right Musc/Skel:  Normal Gen NAD   Assessment/Plan: 1. 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 Physiatrist is providing close team supervision and 24 hour management of active medical problems listed below. Physiatrist and rehab team continue to assess barriers to discharge/monitor patient progress toward functional and medical goals. FIM: FIM - Bathing Bathing Steps Patient Completed: Chest, Right Arm, Left Arm, Abdomen, Front perineal  area, Buttocks, Right upper leg, Left upper leg Bathing: 4: Min-Patient completes 8-9 10f10 parts or 75+ percent  FIM - Upper Body Dressing/Undressing Upper body dressing/undressing steps patient completed: Thread/unthread right sleeve of pullover shirt/dresss, Thread/unthread left sleeve of pullover shirt/dress, Put head through opening of pull over shirt/dress, Pull shirt over trunk Upper body dressing/undressing: 5: Set-up assist to: Obtain clothing/put away FIM - Lower Body Dressing/Undressing Lower body dressing/undressing steps patient completed: Thread/unthread right underwear leg, Thread/unthread left underwear leg, Pull underwear up/down, Thread/unthread right pants leg, Thread/unthread left pants leg, Pull pants up/down, Don/Doff right sock, Don/Doff left sock, Don/Doff right shoe, Don/Doff left shoe Lower body dressing/undressing: 4: Steadying Assist  FIM - Toileting Toileting steps completed by patient: Adjust clothing prior to  toileting, Performs perineal hygiene, Adjust clothing after toileting Toileting Assistive Devices: Grab bar or rail for support Toileting: 4: Steadying assist  FIM - TRadio producerDevices: Grab bars Toilet Transfers: 4-To toilet/BSC: Min A (steadying Pt. > 75%), 4-From toilet/BSC: Min A (steadying Pt. > 75%)  FIM - Bed/Chair Transfer Bed/Chair Transfer: 5: Supine > Sit: Supervision (verbal cues/safety issues), 5: Sit > Supine: Supervision (verbal cues/safety issues), 4: Chair or W/C > Bed: Min A (steadying Pt. > 75%), 4: Bed > Chair or W/C: Min A (steadying Pt. > 75%)  FIM - Locomotion: Wheelchair Distance: 150 Locomotion: Wheelchair: 5: Travels 150 ft or more: maneuvers on rugs and over door sills with supervision, cueing or coaxing FIM - Locomotion: Ambulation Locomotion: Ambulation Assistive Devices: Other (comment) (no assistive device) Ambulation/Gait Assistance: 4: Min guard Locomotion: Ambulation: 4: Travels 150 ft or more with minimal assistance (Pt.>75%)  Comprehension Comprehension Mode: Auditory Comprehension: 6-Follows complex conversation/direction: With extra time/assistive device  Expression Expression Mode: Verbal Expression: 6-Expresses complex ideas: With extra time/assistive device  Social Interaction Social Interaction: 5-Interacts appropriately 90% of the time - Needs monitoring or encouragement for participation or interaction.  Problem Solving Problem Solving: 5-Solves complex 90% of the time/cues < 10% of the time  Memory Memory: 4-Recognizes or recalls 75 - 89% of the time/requires cueing 10 - 24% of the time   Medical Problem List and Plan: 1. 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 Decadron 2 mg twice a day until 08/27/2014 at 1 mg twice a day beginning 08/30/2014 2. DVT Prophylaxis/Anticoagulation: SCDs. Monitor for any signs of DVT 3. Pain Management: Tylenol as  needed 4. Hypertension. Nimotop protocol. Monitor with increased mobility. Patient with history of medical noncompliance in the past. Provide counseling 5. Neuropsych: This patient is capable of making decisions on her own behalf. 6. Skin/Wound Care: Routine skin checks 7. Fluids/Electrolytes/Nutrition: Strict I and O follow chemistries 8. COPD. Albuterol nebulizer as needed. No complaints of shortness of breath 9. Hyperlipidemia. Zocor 10. Diplopia: eye patch LOS (Days) 3 A FACE TO FACE EVALUATION WAS PERFORMED  Jerrico Covello E 08/27/2014, 9:32 AM

## 2014-08-27 NOTE — Progress Notes (Signed)
Physical Therapy Session Note  Patient Details  Name: Gabrielle DeistSharon R Cercone MRN: [id number] Date of Birth: [birth year]  Today's Date: 08/27/2014 PT Individual Time: 1300-1400 PT Individual Time Calculation (min): 60 min   Short Term Goals: Week 1:  PT Short Term Goal 1 (Week 1): STGs = LTGs  Skilled Therapeutic Interventions/Progress Updates:   Pt received sitting in w/c in room, agreeable to therapy session.  Spoke with daughter and step daughter regarding D/C plan as they are requesting that she be able to ambulate in hallways by herself.  Explained that she is still a fall risk and that she would need to be accompanied by family to ambulate around unit.  Also educated that if she is able to ambulate by herself around unit, that she would likely not need to be here much longer.  Discussed that she will not have to wait until Wednesday in order to determine D/C date, esp since it may be sooner than originally set.  Pt and family verbalized understanding.  Pt ambulated to/from therapy gym without AD at S level.  Note that she continues to demonstrate L inattention and tends to veer to the L with gait, however she states it is the R side that she has issues with.  Once in therapy gym, performed high level gait and balance with head turns up/down, side/side, increasing and decreasing gait speed, sudden direction changes and sudden stops.  She performed all with close S, however seemed to have the most difficulty with horizontal head turns.  Also performed two steps (step then landing then step) to simulate home entry at S level.  Then progressed to gait on outdoor surfaces, up/down inclines, over uneven surfaces, and compliant surfaces.  Performed all at close S level with min/mod cues for slower speed and to have S at home with any outdoor gait.  Pt and daughter verbalized understanding.  Ambulated back to unit and ended session with task of having pt work on sorting, memory, and sequencing task by locating  numbers with letters then progressed to locating certain number of post its while negotiating around obstacles.  Performed all at S level.  Pt initially with poor frustration, but when cued to decrease speed and concentrate, she did very well.  Ambulated back to room and discussed possible D/C for Wednesday/Thursday with other daughter and continued safety concerns and cognitive deficits, recommending 24/7 S at D/C.  All verbalized understanding.    Therapy Documentation Precautions:  Precautions Precautions: Fall Restrictions Weight Bearing Restrictions: No   Vital Signs: Therapy Vitals Pulse Rate: 82 BP: (!) 152/67 mmHg Patient Position (if appropriate): Sitting Pain: Pain Assessment Pain Assessment: No/denies pain    See FIM for current functional status  Therapy/Group: Individual Therapy  Vista Deckarcell, Punam Broussard Ann 08/27/2014, 1:24 PM

## 2014-08-27 NOTE — Progress Notes (Signed)
Social Work Assessment and Plan Social Work Assessment and Plan  Patient Details  Name: [name] MRN: [id number] Date of Birth: 07/15/1961  Today's Date: 08/27/2014  Problem List:  Patient Active Problem List   Diagnosis Date Noted  . Encephalopathy acute 08/18/2014  . Pulmonary edema   . Hypoxemia 08/16/2014  . Non-traumatic intracranial subarachnoid hemorrhage   . Subarachnoid hemorrhage due to ruptured aneurysm 08/13/2014  . BREAST PAIN, LEFT 04/16/2009   Past Medical History:  Past Medical History  Diagnosis Date  . Hypertension    Past Surgical History:  Past Surgical History  Procedure Laterality Date  . Cessarian N/A   . Radiology with anesthesia N/A 08/14/2014    Procedure: Embolization   (RADIOLOGY WITH ANESTHESIA) ;  Surgeon: Medication Radiologist, MD;  Location: [facility] OR;  Service: Radiology;  Laterality: N/A;   Social History:  reports that she has been smoking.  She does not have any smokeless tobacco history on file. Her alcohol and drug histories are not on file.  Family / Support Systems Marital Status: Divorced Patient Roles: Programme researcher, broadcasting/film/videoartner, Parent, Other (Comment) (Employee) Spouse/Significant Other: John-fiancee [phone] Children: Jamie-daughter  [phone]  here from [place] Other Supports: Co-workers Anticipated Caregiver: Daugther and fiancee Ability/Limitations of Caregiver: daughter here from [place] until mid-jan, finacee works daytime can be there at night Caregiver Availability: 24/7 Family Dynamics: Close knit family pt has two daughter's both are supportive and involved.  She and Jonny RuizJohn have been together for years and he is very supportive and involved.  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  She is a very independent person and has always taken care of herself.  She id pleased she is doing well and furstrated by her speech issues.  She is trying to adjust., daughter states; " She is not a patient person." Recent Psychosocial Issues: Other health issues she will need to deal with now, has been non-compliant with medical follow up Pyschiatric History: No history deferred depression screen due to pt declined, she would benefit from neuro-psych intervention especially if she wants to return to work and driving. Substance Abuse History: Tobacco and ETOH she is aware of the health risks and realizes she needs to change her habits. Will continue to talk about this during her stay.  At this point she is very defensive about her condition.  Patient / Family Perceptions, Expectations & Goals Pt/Family understanding of illness & functional limitations: Pt and daughter have a good understanding of her conditions and pleased with her  spontaneous recovery.  She wants to be home by Christmas and is pushing for this.  Daughter plans to be here and observe her in therapies to have a better understanding of her goals and recovery. Premorbid pt/family roles/activities: Mother, Employee, Gregor HamsFinancee, Friend, etc Anticipated changes in roles/activities/participation: resume Pt/family expectations/goals: Pt states: " I want to go  home as soon as possible, I can do this at home."  Daughter states: " She needs to stay  to get the benefit for intensive reahb, but she is stubborn."  Building surveyorCommunity Resources Community Agencies: None Premorbid Home Care/DME Agencies: None Transportation available at discharge: [employer] Resource referrals recommended: Neuropsychology, Support group (specify)  Discharge Planning Living Arrangements: Alone Support Systems: Spouse/significant other, Children, Friends/neighbors Type of Residence: Private residence Community education officernsurance Resources: Media plannerrivate Insurance (specify) Administrator([employer]ET[employer]) Financial Resources: Employment Financial Screen Referred: Yes Living Expenses: Own Money Management: Patient Does the patient have any problems obtaining your medications?: No Home Management: Patient Patient/Family Preliminary Plans: Return home with daughter who is [place] from [place] to assist her with her care.  Concern is staying to get the maximum benefit from rehab, due to pt wants to leave quickly.  Daughter plans to be here and attend therapies with pt. She will be assisting at discharge. Social Work Anticipated Follow Up Needs: HH/OP, Support Group  Clinical Impression High level patient who is wanting to go home as soon as possible, supportive daughter who is here from [place] to assist pt with her care. Discussed OP therapies and need for PCP, pt is agreeable to this-reluctantly. Discussed tobacco and ETOH issues and resources out in The community, she will think about. Work on a discharge plan.  Lucy Chrisupree, Kailia Starry G 08/27/2014, 10:07 AM

## 2014-08-27 NOTE — Plan of Care (Signed)
Problem: RH PAIN MANAGEMENT Goal: RH STG PAIN MANAGED AT OR BELOW PT'S PAIN GOAL <4  Outcome: Progressing No c/o pain     

## 2014-08-28 ENCOUNTER — Inpatient Hospital Stay (HOSPITAL_COMMUNITY): Payer: Managed Care, Other (non HMO)

## 2014-08-28 ENCOUNTER — Inpatient Hospital Stay (HOSPITAL_COMMUNITY): Payer: Managed Care, Other (non HMO) | Admitting: Speech Pathology

## 2014-08-28 ENCOUNTER — Inpatient Hospital Stay (HOSPITAL_COMMUNITY): Payer: Managed Care, Other (non HMO) | Admitting: Rehabilitation

## 2014-08-28 NOTE — Progress Notes (Signed)
Occupational Therapy Session Note  Patient Details  Name: [name] MRN: [id number] Date of Birth: 08-18-1961  Today's Date: 08/28/2014 OT Individual Time: 0930-1030 OT Individual Time Calculation (min): 60 min    Short Term Goals: Week 1:  OT Short Term Goal 1 (Week 1): STG=LTG d/t short LOS  Skilled Therapeutic Interventions/Progress Updates: ADL-retraining with focus on improved safety awareness, family ed (daugther present), and dynamic standing balance.   Pt received long sitting in bed with daughter present in chair beside her.   Pt requested clarification of processes which would allow her greater freedom of movement within her room.   Pt and daughter were advised of pt's need for supervision during transfers as reflected in safety plan, updated from min assist to now supervision.   Daughter able to assist as needed only when she is present in room.   Pt completed bathing/dressing with supervision for safety using tub bench as needed although completing shower standing.   Pt recovered briefly after bathing, resting in bed and ambulated to RN station with therapist, then to treatment room for therapeutic exercise instruction to improve motor control of eyes.  Pt completed exercises and was escorted back to her room with therapist; daughter advised on HEP performance and grading of HEP to increase challenge.   Therapy Documentation Precautions:  Precautions Precautions: Fall Restrictions Weight Bearing Restrictions: No  Vital Signs: Therapy Vitals Pulse Rate: 86 BP: 140/66 mmHg Patient Position (if appropriate): Sitting  Pain: 2/10, low back, showered   Exercises: Other Exercises Other Exercises: Occuloimotor exercises, instruction and HEP  See FIM for current functional status  Therapy/Group: Individual Therapy  Ravindra Baranek 08/28/2014, 10:48 AM

## 2014-08-28 NOTE — Progress Notes (Signed)
Physical Therapy Session Note  Patient Details  Name: [name] MRN: [id number] Date of Birth: 1961-07-14  Today's Date: 08/28/2014 PT Individual Time: 1630-1700 PT Individual Time Calculation (min): 30 min   Short Term Goals: Week 1:  PT Short Term Goal 1 (Week 1): STGs = LTGs  Skilled Therapeutic Interventions/Progress Updates:  1:1. Pt received semi-reclined in bed, ready for therapy. Focus this session on functional ambulation and cognitive remediation in community setting. Pt challenged with pathfinding task to navigate from room<>gift shop and engaged in moderately complex money management task in gift shop. Pt req overall min cues for use of external aids, problem solving and memory. Pt mod(I) for ambulation on/off unit >600' inlcluding on/off elevator, in busy hospital lobby and in gift shop. Pt left sitting EOB at end of session w/ all needs in reach and family in room.   Therapy Documentation Precautions:  Precautions Precautions: Fall Restrictions Weight Bearing Restrictions: No  See FIM for current functional status  Therapy/Group: Individual Therapy  Denzil HughesKing, Janeece Blok S 08/28/2014, 5:20 PM

## 2014-08-28 NOTE — Progress Notes (Signed)
Subjective/Complaints: No issues overnite, pt had good day in therapy Discussed pt with SW this am  Review of Systems - Negative except decreased balance  Objective: Vital Signs: Blood pressure 145/69, pulse 65, temperature 98.6 F (37 C), temperature source Oral, resp. rate 18, height 5' 3.75" (1.619 m), weight 57.879 kg (127 lb 9.6 oz), SpO2 96 %. No results found. Results for orders placed or performed during the hospital encounter of 08/24/14 (from the past 72 hour(s))  CBC WITH DIFFERENTIAL     Status: Abnormal   Collection Time: 08/27/14  7:26 AM  Result Value Ref Range   WBC 12.5 (H) 4.0 - 10.5 K/uL   RBC 3.38 (L) 3.87 - 5.11 MIL/uL   Hemoglobin 11.8 (L) 12.0 - 15.0 g/dL   HCT 34.9 (L) 36.0 - 46.0 %   MCV 103.3 (H) 78.0 - 100.0 fL   MCH 34.9 (H) 26.0 - 34.0 pg   MCHC 33.8 30.0 - 36.0 g/dL   RDW 12.9 11.5 - 15.5 %   Platelets 356 150 - 400 K/uL   Neutrophils Relative % 90 (H) 43 - 77 %   Neutro Abs 11.3 (H) 1.7 - 7.7 K/uL   Lymphocytes Relative 5 (L) 12 - 46 %   Lymphs Abs 0.6 (L) 0.7 - 4.0 K/uL   Monocytes Relative 4 3 - 12 %   Monocytes Absolute 0.5 0.1 - 1.0 K/uL   Eosinophils Relative 1 0 - 5 %   Eosinophils Absolute 0.1 0.0 - 0.7 K/uL   Basophils Relative 0 0 - 1 %   Basophils Absolute 0.0 0.0 - 0.1 K/uL  Comprehensive metabolic panel     Status: Abnormal   Collection Time: 08/27/14  7:26 AM  Result Value Ref Range   Sodium 140 137 - 147 mEq/L   Potassium 4.4 3.7 - 5.3 mEq/L   Chloride 99 96 - 112 mEq/L   CO2 26 19 - 32 mEq/L   Glucose, Bld 104 (H) 70 - 99 mg/dL   BUN 12 6 - 23 mg/dL   Creatinine, Ser 0.54 0.50 - 1.10 mg/dL   Calcium 9.2 8.4 - 10.5 mg/dL   Total Protein 6.4 6.0 - 8.3 g/dL   Albumin 3.3 (L) 3.5 - 5.2 g/dL   AST 18 0 - 37 U/L   ALT 46 (H) 0 - 35 U/L   Alkaline Phosphatase 149 (H) 39 - 117 U/L   Total Bilirubin 0.3 0.3 - 1.2 mg/dL   GFR calc non Af Amer >90 >90 mL/min   GFR calc Af Amer >90 >90 mL/min    Comment: (NOTE) The eGFR has  been calculated using the CKD EPI equation. This calculation has not been validated in all clinical situations. eGFR's persistently <90 mL/min signify possible Chronic Kidney Disease.    Anion gap 15 5 - 15     HEENT: normal Cardio: RRR and no murmur Resp: CTA B/L and unlabored GI: BS positive and NT, ND Extremity:  Pulses positive and No Edema Skin:   Intact Neuro: Alert/Oriented, Cranial Nerve II-XII normal, Normal Sensory, Normal Motor, Abnormal FMC Ataxic/ dec FMC and Other mild Dysmetria FNF on Right Musc/Skel:  Normal Gen NAD   Assessment/Plan: 1. 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 Physiatrist is providing close team supervision and 24 hour management of active medical problems listed below. Physiatrist and rehab team continue to assess barriers to discharge/monitor  patient progress toward functional and medical goals. Discussed tent d/c date of 12/24 as well as no immediate return to work FIM: [place] Steps Patient Completed: Chest, Right Arm, Left Arm, Abdomen, Front perineal area, Buttocks, Right upper leg, Left upper leg Bathing: 4: Min-Patient completes 8-9 [id number] parts or 75+ percent  FIM - Upper Body Dressing/Undressing Upper body dressing/undressing steps patient completed: Thread/unthread right sleeve of pullover shirt/dresss, Thread/unthread left sleeve of pullover shirt/dress, Put head through opening of pull over shirt/dress, Pull shirt over trunk Upper body dressing/undressing: 5: Set-up assist to: Obtain clothing/put away FIM - Lower Body Dressing/Undressing Lower body dressing/undressing steps patient completed: Thread/unthread right underwear leg, Thread/unthread left underwear leg, Pull underwear up/down, Thread/unthread right pants leg, Thread/unthread left pants leg, Pull pants up/down, Don/Doff right  sock, Don/Doff left sock, Don/Doff right shoe, Don/Doff left shoe Lower body dressing/undressing: 4: Steadying Assist  FIM - Toileting Toileting steps completed by patient: Adjust clothing prior to toileting, Performs perineal hygiene, Adjust clothing after toileting Toileting Assistive Devices: Grab bar or rail for support Toileting: 4: Steadying assist  FIM - TRadio producerDevices: Grab bars Toilet Transfers: 4-To toilet/BSC: Min A (steadying Pt. > 75%), 4-From toilet/BSC: Min A (steadying Pt. > 75%)  FIM - Bed/Chair Transfer Bed/Chair Transfer: 5: Supine > Sit: Supervision (verbal cues/safety issues), 5: Sit > Supine: Supervision (verbal cues/safety issues), 4: Chair or W/C > Bed: Min A (steadying Pt. > 75%), 4: Bed > Chair or W/C: Min A (steadying Pt. > 75%)  FIM - Locomotion: Wheelchair Distance: 150 Locomotion: Wheelchair: 0: Activity did not occur FIM - Locomotion: Ambulation Locomotion: Ambulation Assistive Devices: Other (comment) (no AD) Ambulation/Gait Assistance: 5: Supervision Locomotion: Ambulation: 5: Travels 150 ft or more with supervision/safety issues  Comprehension Comprehension Mode: Auditory Comprehension: 5-Understands complex 90% of the time/Cues < 10% of the time  Expression Expression Mode: Verbal Expression: 6-Expresses complex ideas: With extra time/assistive device  Social Interaction Social Interaction: 4-Interacts appropriately 75 - 89% of the time - Needs redirection for appropriate language or to initiate interaction.  Problem Solving Problem Solving: 5-Solves basic 90% of the time/requires cueing < 10% of the time  Memory Memory: 4-Recognizes or recalls 75 - 89% of the time/requires cueing 10 - 24% of the time   Medical Problem List and Plan: 1. 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 Decadron 2 mg twice a day until 08/27/2014 at 1 mg twice a day beginning  08/30/2014 2. DVT Prophylaxis/Anticoagulation: SCDs. Monitor for any signs of DVT 3. Pain Management: Tylenol as needed 4. Hypertension. Nimotop protocol. Monitor with increased mobility. Patient with history of medical noncompliance in the past. Provide counseling 5. Neuropsych: This patient is capable of making decisions on her own behalf. 6. Skin/Wound Care: Routine skin checks 7. Fluids/Electrolytes/Nutrition: Strict I and O follow chemistries 8. COPD. Albuterol nebulizer as needed. No complaints of shortness of breath 9. Hyperlipidemia. Zocor 10. Diplopia: eye patch LOS (Days) 4 A FACE TO FACE EVALUATION WAS PERFORMED  KIRSTEINS,ANDREW E 08/28/2014, 9:08 AM

## 2014-08-28 NOTE — Plan of Care (Signed)
Problem: RH Wheelchair Mobility Goal: LTG Patient will propel w/c in community environment (PT) LTG: Patient will propel wheelchair in community environment, # of feet with assist (PT)  Outcome: Not Applicable Date Met:  03/35/33 Pt will be ambulatory at D/C, and will not need a w/c, therefore will D/C w/c goals.

## 2014-08-28 NOTE — Patient Care Conference (Signed)
Inpatient RehabilitationTeam Conference and Plan of Care Update Date: 08/29/2014   Time: 11;30 AM    Patient Name: Gabrielle DeistSharon R Marchuk      Medical Record Number: [id number]  Date of Birth: 1960/11/23 Sex: Female         Room/Bed: 4W26C/4W26C-01 Payor Info: Payor: Monia PouchAETNA / Plan: Derrek GuAETNA MANAGED / Product Type: *No Product type* /    Admitting Diagnosis: nurkmar anersym coiling  Admit Date/Time:  08/24/2014  2:28 PM Admission Comments: No comment available   Primary Diagnosis:  Non-traumatic intracranial subarachnoid hemorrhage Principal Problem: Non-traumatic intracranial subarachnoid hemorrhage  Patient Active Problem List   Diagnosis Date Noted  . Encephalopathy acute 08/18/2014  . Pulmonary edema   . Hypoxemia 08/16/2014  . Non-traumatic intracranial subarachnoid hemorrhage   . Subarachnoid hemorrhage due to ruptured aneurysm 08/13/2014  . BREAST PAIN, LEFT 04/16/2009    Expected Discharge Date: Expected Discharge Date: 08/29/14  Team Members Present: Physician leading conference: Dr. Claudette LawsAndrew Kirsteins Social Worker Present: Dossie DerBecky Cylee Dattilo, LCSW Nurse Present: Carmie EndAngie Joyce, RN PT Present: Harriet ButteEmily Parcell, PT OT Present: Perrin MalteseJames McGuire, Domenic SchwabT;Katie Pittman, OT SLP Present: Feliberto Gottronourtney Payne, SLP PPS Coordinator present : Tora DuckMarie Noel, RN, CRRN     Current Status/Progress Goal Weekly Team Focus  Medical   rapid recovery of function  home at Mod I level  D/C planning   Bowel/Bladder   Continent of bowel and bladder. LBM 08/27/14  Pt to remain continent of bowel and bladder  Monitor   Swallow/Nutrition/ Hydration     wfl        ADL's   Mod I for self care and supervision for IADLs  Mod I  pt education, pt discharged home with 24/7 supervision   Mobility     supervision level-mod/i level-balance issues   mod I   pt/family education home with 24 supervision via daughter  Communication     wfl        Safety/Cognition/ Behavioral Observations  supervision  no unsafe behaviors  supervision  family education complete, pt discharged home with 24/7 supervision    Pain   No c/o pain  <3  Monitor for nonverbal cues of pain   Skin   CDI  CDI  Assess q shift. Encourage turn q 2hrs      *See Care Plan and progress notes for long and short-term goals.  Barriers to Discharge: pt unable to drive yet    Possible Resolutions to Barriers:  family to transport home and to outpt therapy    Discharge Planning/Teaching Needs:    Home daughter is here from [place] to assist pt with her care.  Has been through education and aware of her needs.     Team Discussion:  Pt's goals-mod/i level-supervision for cognition issues. Reached goals quickly, medically stable for discharge today. Op follow up therapies-PT & SP.   Revisions to Treatment Plan:  Ready for dc today   Continued Need for Acute Rehabilitation Level of Care: The patient requires daily medical management by a physician with specialized training in physical medicine and rehabilitation for the following conditions: Daily direction of a multidisciplinary physical rehabilitation program to ensure safe treatment while eliciting the highest outcome that is of practical value to the patient.: Yes Daily medical management of patient stability for increased activity during participation in an intensive rehabilitation regime.: Yes Daily analysis of laboratory values and/or radiology reports with any subsequent need for medication adjustment of medical intervention for : Neurological problems  Theus Espin, Lemar LivingsRebecca G 08/29/2014, 1:10 PM

## 2014-08-28 NOTE — Progress Notes (Signed)
Social Work Patient ID: Samara DeistSharon R [place], female   DOB: 05/16/1961, 53 y.o.   MRN: [id number] Team feels pt will be ready for discharge tomorrow. Have contacted MD he is agreeable to this, medically stable. Have arranged follow up OP PT & SP, in [place] and [facility].  Daughter has been here and Attended therapies.  Prepare for discharge tomorrow.

## 2014-08-28 NOTE — Progress Notes (Signed)
Physical Therapy Discharge Summary  Patient Details  Name: [name] MRN: [id number] Date of Birth: [birth year]  Today's Date: 08/28/2014 PT Individual Time: 1400-1500 PT Individual Time Calculation (min): 60 min    Patient has met 7 of 7 long term goals due to improved activity tolerance, improved balance, improved attention, improved awareness and improved coordination.  Patient to discharge at an ambulatory level Modified Independent.   Patient's care partner is independent to provide the necessary cognitive assistance at discharge.  Reasons goals not met: Pt at mod I level in home and for mobility in community, however for higher level cognitive tasks, recommend 24/7 S from daughter and fiance.    Recommendation:  Patient will benefit from ongoing skilled PT services in outpatient setting to continue to advance safe functional mobility, address ongoing impairments in decreased balance, decreased awareness, decreased higher level cognitive functioning, and minimize fall risk.  Equipment: No equipment provided  Reasons for discharge: treatment goals met and discharge from hospital  Patient/family agrees with progress made and goals achieved: Yes   PT Treatment/Intervention: Pt received lying in bed in room, agreeable to therapy session.  Performed gait in controlled, home, and simulated community environment all at mod I level throughout session.  Continues to demonstrate slight L inattention, but compensates better than in previous sessions.  During gait, had pt help set up for Christmas party and had her carry multiple items from ADL kitchen to day room in order to prepare.  Again performed at mod I level.  Discussed moving D/C date to tomorrow, pt in agreement.  Notified CSW and she spoke with MD.  Angus Seller of session addressing goals and high level balance.  Performed flight of stairs with single handrail at S level in alternating pattern with min cues for safety.  Performed bed  mobility in ADL apt at independent level, furniture transfers, car transfers at mod I level.  Performed floor recover at S level with min cues for when to attempt floor transfer vs when to call 911.  Performed BERG balance test with score of 53/56, see details below.  Discussed results and marked improvement from time of eval.  Had pt work on picking up objects from floor to address vestibular issues and balance.  Performed at mod I level.  Pt ambulated back to family room to make coffee and carried it back to room all at mod I level.  Discussed D/C with daughter who verbalized understanding for moving date to tomorrow.  Explained OP PT and that CSW would set this up.  All needs in room.    PT Discharge Precautions/Restrictions Precautions Precautions: Fall Precaution Comments: mild L inattention Restrictions Weight Bearing Restrictions: No Vital Signs Therapy Vitals Temp: 98.1 F (36.7 C) Temp Source: Oral Pulse Rate: 100 Resp: 19 BP: (!) 142/66 mmHg Patient Position (if appropriate): Standing Oxygen Therapy SpO2: 98 % O2 Device: Not Delivered Pain Pain Assessment Pain Assessment: No/denies pain Pain Score: 0-No pain Vision/Perception   See OT note.  Cognition Overall Cognitive Status: Impaired/Different from baseline Arousal/Alertness: Awake/alert Orientation Level: Oriented X4 Attention: Selective Sustained Attention: Appears intact Selective Attention: Impaired Selective Attention Impairment: Verbal basic;Functional basic Memory: Impaired Memory Impairment: Decreased recall of new information Awareness: Impaired Awareness Impairment: Emergent impairment Problem Solving: Impaired Problem Solving Impairment: Functional basic Executive Function: Organizing;Self Correcting;Self Monitoring Organizing: Impaired Organizing Impairment: Functional basic Self Monitoring: Impaired Self Monitoring Impairment: Functional complex Self Correcting: Impaired Self Correcting  Impairment: Functional complex Behaviors: Impulsive;Poor frustration tolerance Safety/Judgment: Impaired Comments: Pt  continues to have little awareness of errors, however is mod I from mobility aspect Sensation Sensation Light Touch: Appears Intact Stereognosis: Not tested Hot/Cold: Not tested Proprioception: Appears Intact Coordination Gross Motor Movements are Fluid and Coordinated: Yes Fine Motor Movements are Fluid and Coordinated: Yes Heel Shin Test: [facility] Motor  Motor Motor: Within Functional Limits  Mobility Bed Mobility Bed Mobility: Supine to Sit;Sit to Supine Supine to Sit: 7: Independent Sit to Supine: 7: Independent Transfers Transfers: Yes Sit to Stand: 6: Modified independent (Device/Increase time) Stand to Sit: 6: Modified independent (Device/Increase time) Stand Pivot Transfers: 6: Modified independent (Device/Increase time) Locomotion  Ambulation Ambulation: Yes Ambulation/Gait Assistance: 6: Modified independent (Device/Increase time) Ambulation Distance (Feet): 300 Feet Assistive device: None Gait Gait: Yes Gait Pattern: Impaired Gait Pattern:  (staggers left intermittently) Stairs / Additional Locomotion Stairs: Yes Stairs Assistance: 5: Supervision Stairs Assistance Details: Verbal cues for precautions/safety Stair Management Technique: One rail Right Number of Stairs: 12 Height of Stairs: 6 Wheelchair Mobility Wheelchair Mobility: No (pt ambulatory) Distance:  (pt ambulatory)  Trunk/Postural Assessment  Cervical Assessment Cervical Assessment: Within Functional Limits Thoracic Assessment Thoracic Assessment: Within Functional Limits Lumbar Assessment Lumbar Assessment: Within Functional Limits Postural Control Postural Control: Within Functional Limits  Balance Balance Balance Assessed: Yes Standardized Balance Assessment Standardized Balance Assessment: Berg Balance Test Berg Balance Test Sit to Stand: Able to stand without using  hands and stabilize independently Standing Unsupported: Able to stand safely 2 minutes Sitting with Back Unsupported but Feet Supported on Floor or Stool: Able to sit safely and securely 2 minutes Stand to Sit: Sits safely with minimal use of hands Transfers: Able to transfer safely, minor use of hands Standing Unsupported with Eyes Closed: Able to stand 10 seconds safely Standing Ubsupported with Feet Together: Able to place feet together independently and stand 1 minute safely From Standing, Reach Forward with Outstretched Arm: Can reach confidently >25 cm (10") From Standing Position, Pick up Object from Floor: Able to pick up shoe safely and easily From Standing Position, Turn to Look Behind Over each Shoulder: Looks behind from both sides and weight shifts well Turn 360 Degrees: Able to turn 360 degrees safely in 4 seconds or less Standing Unsupported, Alternately Place Feet on Step/Stool: Able to stand independently and safely and complete 8 steps in 20 seconds Standing Unsupported, One Foot in Front: Able to plae foot ahead of the other independently and hold 30 seconds Standing on One Leg: Able to lift leg independently and hold equal to or more than 3 seconds Total Score: 53 Extremity Assessment      RLE Assessment RLE Assessment: Within Functional Limits LLE Assessment LLE Assessment: Within Functional Limits  See FIM for current functional status  Denice Bors 08/28/2014, 7:23 PM

## 2014-08-28 NOTE — Progress Notes (Signed)
Patient information entered into [facility] system by Edson SnowballBecky Windsor, PT on 08/27/14 and reviewed by Tora DuckMarie Shiah Berhow, RN, CRRN, PPS Coordinator.  Information including medical coding and functional independence measure will be reviewed and updated through discharge.

## 2014-08-28 NOTE — Discharge Instructions (Signed)
Inpatient Rehab Discharge Instructions  Samara DeistSharon R Yeley Discharge date and time: No discharge date for patient encounter.   Activities/Precautions/ Functional Status: Activity: activity as tolerated Diet: regular diet Wound Care: none needed Functional status:  ___ No restrictions     ___ Walk up steps independently ___ 24/7 supervision/assistance   ___ Walk up steps with assistance ___ Intermittent supervision/assistance  ___ Bathe/dress independently ___ Walk with walker     ___ Bathe/dress with assistance ___ Walk Independently    ___ Shower independently _x__ Walk with assistance    ___ Shower with assistance ___ No alcohol     ___ Return to work/school ________  Special Instructions:     COMMUNITY REFERRALS UPON DISCHARGE:      Outpatient: PT & SP  Agency:[facility] Phone:[phone] Date of Last Service:08/30/2014   Appointment Date/Time:WILL CONTACT YOU OR YOUR DAUGHTER TO SET UP APPOINTMENTS  Medical Equipment/Items Ordered:NO NEEDS     My questions have been answered and I understand these instructions. I will adhere to these goals and the provided educational materials after my discharge from the hospital.  Patient/Caregiver Signature _______________________________ Date __________  Clinician Signature _______________________________________ Date __________  Please bring this form and your medication list with you to all your follow-up doctor's appointments.

## 2014-08-28 NOTE — Plan of Care (Signed)
Problem: RH PAIN MANAGEMENT Goal: RH STG PAIN MANAGED AT OR BELOW PT'S PAIN GOAL <4  Outcome: Completed/Met Date Met:  08/28/14 No c/o pain

## 2014-08-28 NOTE — Progress Notes (Signed)
Speech Language Pathology Discharge Summary  Patient Details  Name: [name] MRN: [id number] Date of Birth: [birth year]  Today's Date: 08/28/2014 SLP Individual Time: 1105-1140 SLP Individual Time Calculation (min): 35 min   Skilled Therapeutic Interventions:  Pt was seen for skilled ST targeting family education and cognitive goals. Upon arrival, pt's daughter was present and remained actively engaged in all therapeutic activities.  Pt presented with questions about her cognition and demonstrated improved emergent awareness of how her current impairments will impact her functional independence at home, stating "I realize that I'm going to need more help for my cognition."  SLP continued previously targeted education related to compensatory strategies for cognition, emphasizing routine, organization, and use of written aids to maximize functional independence for problem solving and recall of dailBurroy information.  Pt recalled 2 activities from previous therapy sessions and recommendations for assistance with medication and financial management at discharge with supervision question cues.  Furthermore, SLP provided skilled education related to stroke recovery, including realistic prognostic expectations for recovery given progress made thus far.        Patient has met 4 of 4 long term goals.  Patient to discharge at overall Supervision level.  Reasons goals not met: n/a   Clinical Impression/Discharge Summary:   Pt made functional gains while inpatient and is discharging having met 4 out of 4 long term goals.  Pt currently requires overall supervision for cognitive tasks due to decreased recall of new, complex information, decreased functional problem solving, and decreased emergent awareness of physical and cognitive impairments.  Pt is discharging home with 24/7 supervision from family and assistance for medication and financial management.  Pt would also benefit from skilled ST follow up  in either the home health or outpatient setting to maximize functional independence and reduce burden of care upon discharge.  Pt and family education is complete at this time.    Care Partner:  Caregiver Able to Provide Assistance: Yes  Type of Caregiver Assistance: Physical;Cognitive  Recommendation:  24 hour supervision/assistance;Outpatient SLP  Rationale for SLP Follow Up: Maximize cognitive function and independence;Reduce caregiver burden   Equipment: none recommended by SLP    Reasons for discharge: Discharged from hospital   Patient/Family Agrees with Progress Made and Goals Achieved: Yes   See FIM for current functional status  PageSelinda Orion 08/28/2014, 4:41 PM

## 2014-08-29 ENCOUNTER — Telehealth: Payer: Self-pay | Admitting: *Deleted

## 2014-08-29 MED ORDER — DEXAMETHASONE 1 MG PO TABS: 1.0000 mg | ORAL_TABLET | Freq: Two times a day (BID) | ORAL | Status: DC

## 2014-08-29 MED ORDER — ATORVASTATIN CALCIUM 40 MG PO TABS
40.0000 mg | ORAL_TABLET | Freq: Every day | ORAL | Status: DC
Start: 2014-08-29 — End: 2014-10-02

## 2014-08-29 MED ORDER — DEXAMETHASONE 0.5 MG PO TABS
1.0000 mg | ORAL_TABLET | Freq: Two times a day (BID) | ORAL | Status: DC
  Administered 2014-08-29: 1 mg via ORAL
  Filled 2014-08-29 (×3): qty 2

## 2014-08-29 MED ORDER — HYDROCODONE-ACETAMINOPHEN 7.5-325 MG PO TABS: 1.0000 | ORAL_TABLET | ORAL | Status: DC | PRN

## 2014-08-29 MED ORDER — NIMODIPINE 30 MG PO CAPS: ORAL_CAPSULE | ORAL | Status: DC

## 2014-08-29 NOTE — Telephone Encounter (Signed)
Pharmacisit called to say that they do not have nor can they get the namodipine (?) that was ordered at discharge and wondering if it was picked up from hospital pharmacy.(this was a hospital discharge)

## 2014-08-29 NOTE — Progress Notes (Signed)
Occupational Therapy Discharge Summary  Patient Details  Name: [name] MRN: [id number] Date of Birth: [birth year]  Today's Date: 08/29/2014   Patient has met 8 of 9 long term goals due to improved activity tolerance, ability to compensate for deficits and improved awareness.  Patient to discharge at overall Modified Independent level with BADL and supervision for iADL due to mild cognitive impairment.  Patient's care partner is independent to provide the necessary cognitive assistance at discharge.    Reasons goals not met: discharge early at family/pt's request.  Recommendation:  Patient will benefit from ongoing skilled PT/SLP services in outpatient setting to facilitate improved functional mobility, endurance, and further remediation of cognitive impairment.  Equipment: No equipment provided  Reasons for discharge: discharge from hospital  Patient/family agrees with progress made and goals achieved: Yes  OT Discharge Precautions/Restrictions  Precautions Precautions: Fall Precaution Comments: mild L inattention Restrictions Weight Bearing Restrictions: No  Vital Signs Therapy Vitals Temp: 98.4 F (36.9 C) Temp Source: Oral Pulse Rate: 78 Resp: 17 BP: 129/67 mmHg Patient Position (if appropriate): Lying Oxygen Therapy SpO2: 98 % O2 Device: Not Delivered  Pain Pain Assessment Pain Assessment: No/denies pain  ADL ADL ADL Comments: see FIM  Vision/Perception  Vision- History Baseline Vision/History: Wears glasses Wears Glasses: At all times Patient Visual Report: Eye fatigue/eye pain/headache Vision- Assessment Vision Assessment?: Yes Additional Comments: decreased visual symmetry with medial deviation of right eye Perception Comments: WFL   Cognition Overall Cognitive Status: Impaired/Different from baseline Arousal/Alertness: Awake/alert Orientation Level: Oriented X4 Attention: Selective Sustained Attention: Appears intact Selective  Attention: Impaired Selective Attention Impairment: Verbal basic;Functional basic Memory: Impaired Memory Impairment: Decreased recall of new information Awareness: Impaired Awareness Impairment: Emergent impairment Problem Solving Impairment: Functional basic Executive Function: Organizing;Self Correcting;Self Monitoring Organizing: Impaired Organizing Impairment: Functional basic Self Monitoring: Impaired Self Monitoring Impairment: Functional complex Self Correcting: Impaired Self Correcting Impairment: Functional complex Behaviors: Impulsive;Poor frustration tolerance Safety/Judgment: Impaired Comments: Pt continues to have little awareness of errors, however is mod I from mobility aspect  Sensation Sensation Light Touch: Appears Intact Stereognosis: Appears Intact Hot/Cold: Appears Intact Proprioception: Appears Intact Additional Comments: BUE WFL Coordination Gross Motor Movements are Fluid and Coordinated: Yes Fine Motor Movements are Fluid and Coordinated: Yes  Motor  Motor Motor: Within Functional Limits  Mobility  Bed Mobility Bed Mobility: Supine to Sit;Sit to Supine Supine to Sit: 7: Independent Sit to Supine: 7: Independent Transfers Transfers: Sit to Stand;Stand to Sit Sit to Stand: 6: Modified independent (Device/Increase time) Stand to Sit: 6: Modified independent (Device/Increase time)   Trunk/Postural Assessment  Cervical Assessment Cervical Assessment: Within Functional Limits Thoracic Assessment Thoracic Assessment: Within Functional Limits Lumbar Assessment Lumbar Assessment: Within Functional Limits Postural Control Postural Control: Within Functional Limits   Balance Balance Balance Assessed: Yes Standardized Balance Assessment Standardized Balance Assessment: Berg Balance Test Berg Balance Test Sit to Stand: Able to stand without using hands and stabilize independently Standing Unsupported: Able to stand safely 2 minutes Sitting  with Back Unsupported but Feet Supported on Floor or Stool: Able to sit safely and securely 2 minutes Stand to Sit: Sits safely with minimal use of hands Transfers: Able to transfer safely, minor use of hands Standing Unsupported with Eyes Closed: Able to stand 10 seconds safely Standing Ubsupported with Feet Together: Able to place feet together independently and stand 1 minute safely From Standing, Reach Forward with Outstretched Arm: Can reach confidently >25 cm (10") From Standing Position, Pick up Object from Floor: Able to pick up  shoe safely and easily From Standing Position, Turn to Look Behind Over each Shoulder: Looks behind from both sides and weight shifts well Turn 360 Degrees: Able to turn 360 degrees safely in 4 seconds or less Standing Unsupported, Alternately Place Feet on Step/Stool: Able to stand independently and safely and complete 8 steps in 20 seconds Standing Unsupported, One Foot in Front: Able to plae foot ahead of the other independently and hold 30 seconds Standing on One Leg: Able to lift leg independently and hold equal to or more than 3 seconds Total Score: 53  Extremity/Trunk Assessment RUE Assessment RUE Assessment: Within Functional Limits LUE Assessment LUE Assessment: Within Functional Limits  See FIM for current functional status  [name] 08/29/2014, 6:03 AM

## 2014-08-29 NOTE — Progress Notes (Signed)
Pt discharged to home with family at [id number]. Discharge instrcutions given by Harvel Ricksan Anguilli, PA with verbal understanding. Belongings with pt.

## 2014-08-29 NOTE — Progress Notes (Signed)
Social Work Discharge Note Discharge Note  The overall goal for the admission was met for:   Discharge location: [place]  Length of Stay: Yes-6 DAYS  Discharge activity level: Yes-MOD/I-SUPERVISION FOR COGNITION  Home/community participation: Yes  Services provided included: MD, RD, PT, OT, SLP, RN, CM, Pharmacy and SW  Financial Services: Private Insurance: [place]  Follow-up services arranged: Outpatient: [facility] & SP CALL FAMILY TO ARRANGE APPOINTMENTS and Patient/Family has no preference for HH/DME agencies NO EQUIPMENT NEEDS. PCP ARRANGED AND FIRST APPOINTMENT MADE  Comments (or additional information):DAUGHTER IS HERE FROM [place] TO PROVIDE ASSIST AND HAS BEEN EDUCATED ON HER CARE  Patient/Family verbalized understanding of follow-up arrangements: Yes  Individual responsible for coordination of the follow-up plan: SELF & JAMIE-DAUGHTER  Confirmed correct DME delivered: Elease Hashimoto 08/29/2014    Elease Hashimoto

## 2014-08-29 NOTE — Discharge Summary (Signed)
Discharge summary job [id number]

## 2014-08-29 NOTE — Discharge Summary (Signed)
NAME:  [name], [name]              ACCOUNT NO.:  [id number]  MEDICAL RECORD NO.:  [id number]  LOCATION:  [id number]                        FACILITY:  [facility]  PHYSICIAN:  Erick ColaceAndrew E. Kirsteins, M.D.DATE OF BIRTH:  1961-02-15  DATE OF ADMISSION:  08/24/2014 DATE OF DISCHARGE:  08/29/2014                              DISCHARGE SUMMARY   DISCHARGE DIAGNOSES: 1. 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 2. Sequential compression devices for deep venous thrombosis     prophylaxis. 3. Hypertension. 4. Chronic obstructive pulmonary disease. 5. Hyperlipidemia. 6. Diplopia.  HISTORY OF PRESENT ILLNESS:  This is a 53 year old right-handed female with history of hypertension, medical noncompliance, who was admitted on August 13, 2014, after developing sudden onset of severe headache and neck pain while at work.  The patient independent prior to admission working as an Pension scheme managerairline inspector for [employer], living with her fiance. Cranial CT scan revealed a subarachnoid hemorrhage.  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 Conchita ParisNundkumar. Decadron taper as advised.  HOSPITAL COURSE:  Respiratory failure.  Critical Care Medicine follow up.  Chest x-ray with bilateral interstitial infiltrates consistent with cardiogenic pulmonary edema responding nicely to Lasix.  Echocardiogram with ejection fraction of 75%.  Normal systolic function.  Blood pressures monitored closely, tolerating a regular diet.  She was admitted for a comprehensive rehab program.  PAST MEDICAL HISTORY:  See discharge diagnoses.  SOCIAL HISTORY:  Lives with fiance, independent prior to admission.  FUNCTIONAL STATUS:  Upon admission to rehab services was minimal assist to ambulate 50 feet.  Min to mod assist activities of daily living.  PHYSICAL EXAMINATION:  VITAL SIGNS:  Blood pressure 156/84, pulse 72, temperature 98, respirations 14. GENERAL:  This  was an alert female, made good eye contact with examiner, fair awareness of her deficits, oriented x3. LUNGS:  Clear to auscultation. CARDIAC:  Regular rate and rhythm. ABDOMEN:  Soft, nontender.  Good bowel sounds.  She was somewhat impulsive during exam.  REHABILITATION HOSPITAL COURSE:  The patient was admitted to inpatient rehab services with therapies initiated on a 3-hour daily basis consisting of physical therapy, occupational therapy, speech therapy, and rehabilitation nursing.  The following issues were addressed during the patient's rehabilitation stay.  Pertaining to the patient's subarachnoid hemorrhage with aneurysm, underwent coiling.  She would follow up with Neurosurgery.  Decadron taper as advised.  Blood pressure monitored on Nimotop protocol.  She continued on Lipitor for hyperlipidemia.  No bowel or bladder disturbances.  She was tolerating a regular diet.  The patient received weekly collaborative interdisciplinary team conferences to discuss estimated length of stay, family teaching, any barriers to her discharge.  She was modified independent for her functional mobility.  Sessions focused on activities of daily living with fiance and daughter.  She completed bathing, dressing supervision level with safety monitoring.  She was discharged to home with family teaching completed.  DISCHARGE MEDICATIONS: 1. Lipitor 40 mg p.o. daily. 2. Decadron taper as advised. 3. Hydrocodone 1 tab with every 4 hours as needed for pain, dispense     of 90 tablets. 4. Nimotop per protocol. Her diet was regular.  The  patient would follow up with Dr. Claudette LawsAndrew Kirsteins at the outpatient rehab center as advised.  Dr. Conchita ParisNundkumar, call for appointment.  Dr. Caleen EssexBreeback, medical management on September 03, 2014.     [name], P.A.   ______________________________ Erick ColaceAndrew E. Kirsteins, M.D.    DA/MEDQ  D:  08/29/2014  T:  08/29/2014  Job:  [id number]  cc:   Tandy GawJade Breeback, PA-C

## 2014-09-03 ENCOUNTER — Ambulatory Visit (INDEPENDENT_AMBULATORY_CARE_PROVIDER_SITE_OTHER): Payer: Managed Care, Other (non HMO) | Admitting: Physician Assistant

## 2014-09-03 ENCOUNTER — Encounter: Payer: Self-pay | Admitting: Physician Assistant

## 2014-09-03 VITALS — BP 109/65 | HR 75 | Ht 63.75 in | Wt 127.0 lb

## 2014-09-03 DIAGNOSIS — R454 Irritability and anger: Secondary | ICD-10-CM

## 2014-09-03 DIAGNOSIS — I607 Nontraumatic subarachnoid hemorrhage from unspecified intracranial artery: Secondary | ICD-10-CM

## 2014-09-03 DIAGNOSIS — Z87891 Personal history of nicotine dependence: Secondary | ICD-10-CM | POA: Insufficient documentation

## 2014-09-03 DIAGNOSIS — F411 Generalized anxiety disorder: Secondary | ICD-10-CM

## 2014-09-03 DIAGNOSIS — I619 Nontraumatic intracerebral hemorrhage, unspecified: Secondary | ICD-10-CM

## 2014-09-03 DIAGNOSIS — I608 Other nontraumatic subarachnoid hemorrhage: Secondary | ICD-10-CM

## 2014-09-03 HISTORY — DX: Personal history of nicotine dependence: Z87.891

## 2014-09-03 NOTE — Progress Notes (Signed)
Subjective:    Patient ID: Gabrielle DeistSharon R Radich, female    DOB: 1960/12/12, 53 y.o.   MRN: [id number]  HPI  pt is a 53 yo female who presents to the clinic to establish care.   .. Active Ambulatory Problems    Diagnosis Date Noted  . BREAST PAIN, LEFT 04/16/2009  . Subarachnoid hemorrhage due to ruptured aneurysm 08/13/2014  . Non-traumatic intracranial subarachnoid hemorrhage   . Hypoxemia 08/16/2014  . Encephalopathy acute 08/18/2014  . Pulmonary edema    Resolved Ambulatory Problems    Diagnosis Date Noted  . Shortness of breath    Past Medical History  Diagnosis Date  . Hypertension    .Marland Kitchen. Family History  Problem Relation Age of Onset  . Alcohol abuse Mother   . Cancer Mother     breast  . Hyperlipidemia Mother   . Hypertension Mother   . Aneurysm Mother   . Alcohol abuse Father   . Stroke Father   . Aneurysm Father   . Hyperlipidemia Sister   . Hypertension Sister   . Aneurysm Sister   . Aneurysm Sister    .Marland Kitchen. History   Social History  . Marital Status: Single    Spouse Name: N/A    Number of Children: N/A  . Years of Education: N/A   Occupational History  . Not on file.   Social History Main Topics  . Smoking status: Former Smoker -- 1.00 packs/day  . Smokeless tobacco: Not on file  . Alcohol Use: 0.0 oz/week    0 Not specified per week  . Drug Use: No  . Sexual Activity: Yes   Other Topics Concern  . Not on file   Social History Narrative   Pt [facility] in [facility]. Presented to ER with worse Right sided HA of her life. 08/14/14 she had coils placed for aneursym. One 08/18/14 she then had a stroke. She was started on CCB, decadron, and lipitor. She follows up with neurologist Dr. Domingo Madeiramundkuner on Wednesday. She start PT and OT next week as well. She still has delayed speech and comprehension, vision changes, memory issues, and agitation. Has a long FM of aneursym and stroke.   Never seen PCP. No health  maintence done.   Daughter is concerned with mood of pt. She is very irritable and snaps a lot. She is not herself. Pt denies any depression. She does states she is very frustrated. Overnight she went from independent person to having to depend on people. She feels like she is not being listen too.    Review of Systems  All other systems reviewed and are negative.      Objective:   Physical Exam  Constitutional: She is oriented to person, place, and time. She appears well-developed and well-nourished.  HENT:  Head: Normocephalic and atraumatic.  Cardiovascular: Normal rate, regular rhythm and normal heart sounds.   Pulmonary/Chest: Effort normal and breath sounds normal.  Neurological: She is alert and oriented to person, place, and time. She displays normal reflexes. Coordination normal.  Strength of upper and lower extremities 5/5 symmetric  Skin: Skin is dry.  Psychiatric: She has a normal mood and affect. Her behavior is normal.          Assessment & Plan:  SAH/Stroke- follow up with neurologist. No strength changes on exam today. Pt unable to perform vision testing due to blurriness of vision. Stay on lipitor. Will check lipid level in 6  weeks with CmP.   Discussed at some point needs CPE and to get up to date on screenings. Pt aware.   Anxiety/irritability- will refer for counseling to talk to someone about changes in independence and hospital experience.    Former smoker- stopped smoking since 08/13/14.

## 2014-09-05 ENCOUNTER — Encounter: Payer: Self-pay | Admitting: Physician Assistant

## 2014-09-05 DIAGNOSIS — E785 Hyperlipidemia, unspecified: Secondary | ICD-10-CM | POA: Insufficient documentation

## 2014-09-05 DIAGNOSIS — I10 Essential (primary) hypertension: Secondary | ICD-10-CM | POA: Insufficient documentation

## 2014-09-05 DIAGNOSIS — J449 Chronic obstructive pulmonary disease, unspecified: Secondary | ICD-10-CM

## 2014-09-05 DIAGNOSIS — H532 Diplopia: Secondary | ICD-10-CM | POA: Insufficient documentation

## 2014-09-05 HISTORY — DX: Diplopia: H53.2

## 2014-09-05 HISTORY — DX: Hyperlipidemia, unspecified: E78.5

## 2014-09-05 HISTORY — DX: Chronic obstructive pulmonary disease, unspecified: J44.9

## 2014-09-19 ENCOUNTER — Telehealth: Payer: Self-pay | Admitting: *Deleted

## 2014-09-19 NOTE — Telephone Encounter (Signed)
Does she HAVE or HAD appt on Monday with [facility]. If they are seeing her for this condition they need to follow up with pain management and cessation. If she HAS appt then ok to given her hydrocodone 5/325mg  1 po every 8 hours as needed for pain # 30 until she can see [facility].

## 2014-09-19 NOTE — Telephone Encounter (Signed)
Pt calls this morning stating that she is having severe right hip pain. She had pt Monday morning with [facility]. It's hurting her to sit & lay down.  She currently has 15 tabs of her hydrocodone left & is really trying to only take them q6h prn.  She's afraid of running out & wanted to know if you'd consider giving her a refill.  We can post date it.  Please advise.

## 2014-09-20 NOTE — Telephone Encounter (Signed)
Spoke with pt & advised her that you are ok with giving her a weaker dose of the hydrocodone.  She has a f/u appt with her neuro on the 21st.

## 2014-09-21 ENCOUNTER — Other Ambulatory Visit: Payer: Self-pay | Admitting: *Deleted

## 2014-09-21 MED ORDER — HYDROCODONE-ACETAMINOPHEN 5-325 MG PO TABS: 1.0000 | ORAL_TABLET | Freq: Three times a day (TID) | ORAL | Status: DC | PRN

## 2014-09-28 ENCOUNTER — Inpatient Hospital Stay: Payer: Managed Care, Other (non HMO) | Admitting: Physical Medicine & Rehabilitation

## 2014-10-01 ENCOUNTER — Inpatient Hospital Stay: Payer: Managed Care, Other (non HMO) | Admitting: Physical Medicine & Rehabilitation

## 2014-10-02 ENCOUNTER — Encounter: Payer: Self-pay | Admitting: Physician Assistant

## 2014-10-02 ENCOUNTER — Ambulatory Visit (INDEPENDENT_AMBULATORY_CARE_PROVIDER_SITE_OTHER): Payer: Managed Care, Other (non HMO) | Admitting: Physician Assistant

## 2014-10-02 VITALS — BP 140/74 | HR 79 | Ht 63.75 in | Wt 130.0 lb

## 2014-10-02 DIAGNOSIS — M25551 Pain in right hip: Secondary | ICD-10-CM

## 2014-10-02 DIAGNOSIS — M545 Low back pain, unspecified: Secondary | ICD-10-CM

## 2014-10-02 DIAGNOSIS — I607 Nontraumatic subarachnoid hemorrhage from unspecified intracranial artery: Secondary | ICD-10-CM

## 2014-10-02 DIAGNOSIS — H532 Diplopia: Secondary | ICD-10-CM

## 2014-10-02 DIAGNOSIS — I608 Other nontraumatic subarachnoid hemorrhage: Secondary | ICD-10-CM

## 2014-10-02 MED ORDER — DICLOFENAC SODIUM 1.5 % TD SOLN: TRANSDERMAL | Status: DC

## 2014-10-02 MED ORDER — ATORVASTATIN CALCIUM 40 MG PO TABS: 40.0000 mg | ORAL_TABLET | Freq: Every day | ORAL | Status: DC

## 2014-10-02 NOTE — Progress Notes (Signed)
   Subjective:    Patient ID: [name], female    DOB: 1961/06/14, 54 y.o.   MRN: [id number]  HPI  Patient is a 54 year old female who presents to the clinic needing a referral.  She was establish as a new patient last month after having a subarachnoid hemorrhage and had some secondary sequela. She was seen once by neurologist. He did write her out of work until June. This is mostly due to her double vision. She's not been able to see him recently but reports her double vision it has much improved. He wanted her to see a neuro-ophthalmologist but the first available appointment with April. She is not getting paid being out of work. She would really like to be released to go back to work. She does a [employer] job and is at her desk all day. She has minimal right hip and right arm weakness. She feels like she's getting stronger every day. She would like to see an ophthalmologist who can make sure she does not need to change prescription for her glasses.  She is also having right hip pain and low back pain off and on for years but worsening over last month or so. Pain does not radiate down legs. Worse at the end of day with lots of bending and walking. Pain at worst is 8 but on average a 4. Never been evaluated. Right hip feels unstable. Never fallen.   Review of Systems  All other systems reviewed and are negative.      Objective:   Physical Exam  Constitutional: She is oriented to person, place, and time. She appears well-developed and well-nourished.  HENT:  Head: Normocephalic and atraumatic.  Cardiovascular: Normal rate, regular rhythm and normal heart sounds.   Pulmonary/Chest: Effort normal and breath sounds normal. She has no wheezes.  Musculoskeletal:  Pain over SI joint around lower lumbar spine to direct palpation.  Negative straight leg raises bilaterally.  5/5 lower extremity strength  Good ROM at right hip and at waist.   Neurological: She is alert and oriented to person,  place, and time.  Skin: Skin is dry.  Psychiatric: She has a normal mood and affect. Her behavior is normal.          Assessment & Plan:  Diplopia/subarachnoid hemorrhage due to ruptured anerursym- referral placed for ophthalmology. Certainly if everything is checking out she can keep her neuro-ophthalmology appointment but perhaps be released to go back to work.neuro exam unremarkable today.   Low back pain/right hip pain- would like to get imaging. Seems like could have some SI joint dysfunction and osteoarthritis at back and hip. Gave topical pennsaid. Pt does not like orals. Refilled norco for break through. Discussed abuse potential and will not give regularly for pain. Follow up in 1 months. Will discuss prednisone and PT.

## 2014-10-10 ENCOUNTER — Encounter: Payer: Self-pay | Admitting: Physician Assistant

## 2014-10-10 DIAGNOSIS — H21239 Degeneration of iris (pigmentary), unspecified eye: Secondary | ICD-10-CM | POA: Insufficient documentation

## 2014-10-10 HISTORY — DX: Degeneration of iris (pigmentary), unspecified eye: H21.239

## 2014-10-12 ENCOUNTER — Encounter: Payer: Managed Care, Other (non HMO) | Attending: Physical Medicine & Rehabilitation

## 2014-10-12 ENCOUNTER — Ambulatory Visit (HOSPITAL_BASED_OUTPATIENT_CLINIC_OR_DEPARTMENT_OTHER): Payer: Managed Care, Other (non HMO) | Admitting: Physical Medicine & Rehabilitation

## 2014-10-12 ENCOUNTER — Encounter: Payer: Self-pay | Admitting: Physical Medicine & Rehabilitation

## 2014-10-12 VITALS — BP 148/85 | HR 95 | Resp 14

## 2014-10-12 DIAGNOSIS — I607 Nontraumatic subarachnoid hemorrhage from unspecified intracranial artery: Secondary | ICD-10-CM | POA: Insufficient documentation

## 2014-10-12 NOTE — Patient Instructions (Signed)
Please call if you need a hip injection

## 2014-10-12 NOTE — Progress Notes (Signed)
Subjective:    Patient ID: [name], female    DOB: 09-07-1961, 54 y.o.   MRN: [id number] 54 year old right-handed female with history of hypertension, medical noncompliance, who was admitted on August 13, 2014, after developing sudden onset of severe headache and neck pain while at work.  The patient independent prior to admission working as an Pension scheme managerairline inspector for [employer] Patient treated with coiling. HPI DATE OF ADMISSION:  08/24/2014 DATE OF DISCHARGE:  08/29/2014  Has followed up with neurosurgery, released to drive, plans to return to work on a part-time basis and work up to a full-time basis.  Has followed up with primary care physician who is also looking into hip pain as well as shoulder pain  Patient has finished with speech therapy, only one outpatient visit needed, PT has 1 more session for that is done.  Pain Inventory Average Pain 3 Pain Right Now 6 My pain is sharp and stabbing  In the last 24 hours, has pain interfered with the following? General activity 5 Relation with others 5 Enjoyment of life 6 What TIME of day is your pain at its worst? no ans Sleep (in general) Fair  Pain is worse with: unsure Pain improves with: rest, heat/ice, therapy/exercise and medication Relief from Meds: 9  Mobility walk without assistance ability to climb steps?  yes do you drive?  yes  Function employed # of hrs/week goes back 10/22/14  Neuro/Psych No problems in this area  Prior Studies Any changes since last visit?  no  Physicians involved in your care Any changes since last visit?  no   Family History  Problem Relation Age of Onset  . Alcohol abuse Mother   . Cancer Mother     breast  . Hyperlipidemia Mother   . Hypertension Mother   . Aneurysm Mother   . Alcohol abuse Father   . Stroke Father   . Aneurysm Father   . Hyperlipidemia Sister   . Hypertension Sister   . Aneurysm Sister   . Aneurysm Sister    History   Social History  .  Marital Status: Single    Spouse Name: N/A    Number of Children: N/A  . Years of Education: N/A   Social History Main Topics  . Smoking status: Former Smoker -- 1.00 packs/day  . Smokeless tobacco: None  . Alcohol Use: 0.0 oz/week    0 Not specified per week  . Drug Use: No  . Sexual Activity: Yes   Other Topics Concern  . None   Social History Narrative   Past Surgical History  Procedure Laterality Date  . Cessarian N/A   . Radiology with anesthesia N/A 08/14/2014    Procedure: Embolization   (RADIOLOGY WITH ANESTHESIA) ;  Surgeon: Medication Radiologist, MD;  Location: [facility] OR;  Service: Radiology;  Laterality: N/A;  . Cesarean section     Past Medical History  Diagnosis Date  . Hypertension    BP 148/85 mmHg  Pulse 95  Resp 14  SpO2 97%  Opioid Risk Score:   Fall Risk Score: Low Fall Risk (0-5 points) Review of Systems  All other systems reviewed and are negative.      Objective:   Physical Exam Visual fields are intact confrontation testing Motor strength 5/5 in bilateral deltoid, biceps, triceps, grip, hip flexor, knee extensor, ankle dorsal flexor plantar flexion Sensation intact to light touch bilateral upper and lower limbs No evidence of facial droop Cerebellar testing mildly impaired right finger nose  to finger normal on the left No evidence of dysdiadochokinesis with rapid alternating supination and pronation of the upper extremities  Gait shows no evidence to drag or knee instability Muscle skeletal exam Upper extremity range of motion is full and pain-free Lower extremity range of motion Limited right hip internal rotation        Assessment & Plan:  1. Subarachnoid hemorrhage due to aneurysmal rupture with minimal residual neurologic deficits. Agree with return to work and return to driving. This is being handled by neurosurgery.  2. Hip pain with limited range of motion. May have some early osteoarthritis as her main limitation is internal  rotation. She is following up with her primary care physician on this. Should she need an injection she may call this office.

## 2014-11-01 ENCOUNTER — Ambulatory Visit (INDEPENDENT_AMBULATORY_CARE_PROVIDER_SITE_OTHER): Payer: Managed Care, Other (non HMO)

## 2014-11-01 ENCOUNTER — Telehealth: Payer: Self-pay | Admitting: *Deleted

## 2014-11-01 DIAGNOSIS — M545 Low back pain, unspecified: Secondary | ICD-10-CM

## 2014-11-01 DIAGNOSIS — M25551 Pain in right hip: Secondary | ICD-10-CM

## 2014-11-01 DIAGNOSIS — M5136 Other intervertebral disc degeneration, lumbar region: Secondary | ICD-10-CM

## 2014-11-01 NOTE — Telephone Encounter (Signed)
Pt called stating that she is experiencing hip pain that is keeping her from sleeping at night. She wants to know if you will prescribe her hydrocodone although she has not been seen recently for this issue. She has and appointment scheduled for 11/06/14. Please advise. Minna AntisEbony Brigham, [place]MA

## 2014-11-02 LAB — COMPLETE METABOLIC PANEL WITH GFR
ALT: 9 U/L (ref 0–35)
AST: 16 U/L (ref 0–37)
Albumin: 4.3 g/dL (ref 3.5–5.2)
Alkaline Phosphatase: 123 U/L — ABNORMAL HIGH (ref 39–117)
BILIRUBIN TOTAL: 0.6 mg/dL (ref 0.2–1.2)
BUN: 11 mg/dL (ref 6–23)
CALCIUM: 9.1 mg/dL (ref 8.4–10.5)
CO2: 26 meq/L (ref 19–32)
Chloride: 104 mEq/L (ref 96–112)
Creat: 0.77 mg/dL (ref 0.50–1.10)
GFR, Est African American: >89 mL/min
GFR, Est Non African American: 87 mL/min
GLUCOSE: 86 mg/dL (ref 70–99)
Potassium: 4.4 mEq/L (ref 3.5–5.3)
Sodium: 141 mEq/L (ref 135–145)
Total Protein: 6.9 g/dL (ref 6.0–8.3)

## 2014-11-02 LAB — LIPID PANEL
CHOL/HDL RATIO: 3.5 ratio
CHOLESTEROL: 230 mg/dL — AB (ref 0–200)
HDL: 65 mg/dL (ref >=46)
LDL Cholesterol: 115 mg/dL — ABNORMAL HIGH (ref 0–99)
Triglycerides: 251 mg/dL — ABNORMAL HIGH (ref <150)
VLDL: 50 mg/dL — AB (ref 0–40)

## 2014-11-02 NOTE — Telephone Encounter (Signed)
Last filled 1/15. So you are taking almost one a day. Goal would be less or to try more intervention. Will discuss at next visit. Ok for refill quanity 30. Will have to pick up.

## 2014-11-06 ENCOUNTER — Ambulatory Visit (INDEPENDENT_AMBULATORY_CARE_PROVIDER_SITE_OTHER): Payer: Managed Care, Other (non HMO) | Admitting: Physician Assistant

## 2014-11-06 ENCOUNTER — Encounter: Payer: Self-pay | Admitting: Physician Assistant

## 2014-11-06 ENCOUNTER — Other Ambulatory Visit: Payer: Self-pay | Admitting: *Deleted

## 2014-11-06 VITALS — BP 150/71 | HR 97 | Ht 63.75 in | Wt 136.0 lb

## 2014-11-06 DIAGNOSIS — M160 Bilateral primary osteoarthritis of hip: Secondary | ICD-10-CM | POA: Diagnosis not present

## 2014-11-06 DIAGNOSIS — M5136 Other intervertebral disc degeneration, lumbar region: Secondary | ICD-10-CM

## 2014-11-06 DIAGNOSIS — M51369 Other intervertebral disc degeneration, lumbar region without mention of lumbar back pain or lower extremity pain: Secondary | ICD-10-CM

## 2014-11-06 HISTORY — DX: Other intervertebral disc degeneration, lumbar region without mention of lumbar back pain or lower extremity pain: M51.369

## 2014-11-06 HISTORY — DX: Other intervertebral disc degeneration, lumbar region: M51.36

## 2014-11-06 MED ORDER — CELECOXIB 200 MG PO CAPS: 200.0000 mg | ORAL_CAPSULE | Freq: Two times a day (BID) | ORAL | Status: DC

## 2014-11-06 MED ORDER — HYDROCODONE-ACETAMINOPHEN 5-325 MG PO TABS: 1.0000 | ORAL_TABLET | Freq: Three times a day (TID) | ORAL | Status: DC | PRN

## 2014-11-06 MED ORDER — ATORVASTATIN CALCIUM 40 MG PO TABS: 40.0000 mg | ORAL_TABLET | Freq: Every day | ORAL | Status: DC

## 2014-11-06 NOTE — Patient Instructions (Addendum)
Fish Oil 4000mg  daily.  Vitamin D \\800  units.  Calcium 1200mg .

## 2014-11-07 ENCOUNTER — Encounter: Payer: Self-pay | Admitting: *Deleted

## 2014-11-07 ENCOUNTER — Telehealth: Payer: Self-pay | Admitting: *Deleted

## 2014-11-07 DIAGNOSIS — M160 Bilateral primary osteoarthritis of hip: Secondary | ICD-10-CM | POA: Insufficient documentation

## 2014-11-07 HISTORY — DX: Bilateral primary osteoarthritis of hip: M16.0

## 2014-11-07 NOTE — Telephone Encounter (Signed)
Pt left vm stating that she does want to go ahead and decrease her weekly work hours to 30-32.  Are you ok with this note?

## 2014-11-07 NOTE — Progress Notes (Signed)
   Subjective:    Patient ID: [name], female    DOB: Nov 04, 1960, 54 y.o.   MRN: [id number]  HPI Patient is a 54 year old female who presents to the clinic to discuss results of x-ray. He she has had some worsening back pain over the past years. She denies any radiation, numbness or tingling into her legs. She denies any saddle anesthesia or bowel or bladder dysfunction.   Review of Systems  All other systems reviewed and are negative.      Objective:   Physical Exam  Constitutional: She is oriented to person, place, and time. She appears well-developed and well-nourished.  Cardiovascular: Normal rate, regular rhythm and normal heart sounds.   Neurological: She is alert and oriented to person, place, and time.  Psychiatric: She has a normal mood and affect. Her behavior is normal.          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 Unfortunately patient cannot tolerate ibuprofen and aspirin due to itchy hives. She admits she has tried her husband's Celebrex and did not get hives. And it did help with pain. We will start Celebrex today. Discussed potential side effects of Celebrex if she has any of these to stop medication call office. I did give a small, D'Amore care. I request that the patient only used this for breakthrough severe pain. I discussed the abuse potential. She is aware and will use sparingly. I did give patient some exercises to work on at home to strengthen the muscles surrounding her spine. Encouraged her to ice the area frequently. Follow-up as needed.

## 2014-11-07 NOTE — Telephone Encounter (Signed)
Perfectly ok with this due to recovering from aneursym and DDD, lumbar and hips.

## 2014-11-07 NOTE — Telephone Encounter (Signed)
LMOM for pt to return my call concerning where to send her work note or if she is going to come pick it up.

## 2015-03-20 ENCOUNTER — Other Ambulatory Visit: Payer: Self-pay | Admitting: Neurosurgery

## 2015-03-21 ENCOUNTER — Ambulatory Visit: Payer: Managed Care, Other (non HMO) | Admitting: Family Medicine

## 2015-03-21 ENCOUNTER — Other Ambulatory Visit (HOSPITAL_COMMUNITY): Payer: Self-pay | Admitting: Neurosurgery

## 2015-03-21 DIAGNOSIS — I609 Nontraumatic subarachnoid hemorrhage, unspecified: Secondary | ICD-10-CM

## 2015-03-26 ENCOUNTER — Ambulatory Visit (INDEPENDENT_AMBULATORY_CARE_PROVIDER_SITE_OTHER): Payer: Managed Care, Other (non HMO) | Admitting: Family Medicine

## 2015-03-26 ENCOUNTER — Encounter: Payer: Self-pay | Admitting: Family Medicine

## 2015-03-26 VITALS — BP 156/83 | HR 82 | Wt 143.0 lb

## 2015-03-26 DIAGNOSIS — I10 Essential (primary) hypertension: Secondary | ICD-10-CM | POA: Diagnosis not present

## 2015-03-26 MED ORDER — OMEPRAZOLE 40 MG PO CPDR: 40.0000 mg | DELAYED_RELEASE_CAPSULE | Freq: Every day | ORAL | Status: DC

## 2015-03-26 MED ORDER — LISINOPRIL 10 MG PO TABS: 10.0000 mg | ORAL_TABLET | Freq: Every day | ORAL | Status: DC

## 2015-03-26 NOTE — Assessment & Plan Note (Addendum)
Start lisinopril. Recheck metabolic panel in 2 weeks with blood pressure recheck via nurse visit.

## 2015-03-26 NOTE — Patient Instructions (Signed)
Thank you for coming in today. Return in 1-2 weeks for a nurse visit to recheck blood pressure and labs Call or go to the emergency room if you get worse, have trouble breathing, have chest pains, or palpitations.   Hypertension Hypertension, commonly called high blood pressure, is when the force of blood pumping through your arteries is too strong. Your arteries are the blood vessels that carry blood from your heart throughout your body. A blood pressure reading consists of a higher number over a lower number, such as 110/72. The higher number (systolic) is the pressure inside your arteries when your heart pumps. The lower number (diastolic) is the pressure inside your arteries when your heart relaxes. Ideally you want your blood pressure below 120/80. Hypertension forces your heart to work harder to pump blood. Your arteries may become narrow or stiff. Having hypertension puts you at risk for heart disease, stroke, and other problems.  RISK FACTORS Some risk factors for high blood pressure are controllable. Others are not.  Risk factors you cannot control include:   Race. You may be at higher risk if you are African American.  Age. Risk increases with age.  Gender. Men are at higher risk than women before age 56 years. After age 56, women are at higher risk than men. Risk factors you can control include:  Not getting enough exercise or physical activity.  Being overweight.  Getting too much fat, sugar, calories, or salt in your diet.  Drinking too much alcohol. SIGNS AND SYMPTOMS Hypertension does not usually cause signs or symptoms. Extremely high blood pressure (hypertensive crisis) may cause headache, anxiety, shortness of breath, and nosebleed. DIAGNOSIS  To check if you have hypertension, your health care provider will measure your blood pressure while you are seated, with your arm held at the level of your heart. It should be measured at least twice using the same arm. Certain  conditions can cause a difference in blood pressure between your right and left arms. A blood pressure reading that is higher than normal on one occasion does not mean that you need treatment. If one blood pressure reading is high, ask your health care provider about having it checked again. TREATMENT  Treating high blood pressure includes making lifestyle changes and possibly taking medicine. Living a healthy lifestyle can help lower high blood pressure. You may need to change some of your habits. Lifestyle changes may include:  Following the DASH diet. This diet is high in fruits, vegetables, and whole grains. It is low in salt, red meat, and added sugars.  Getting at least 2 hours of brisk physical activity every week.  Losing weight if necessary.  Not smoking.  Limiting alcoholic beverages.  Learning ways to reduce stress. If lifestyle changes are not enough to get your blood pressure under control, your health care provider may prescribe medicine. You may need to take more than one. Work closely with your health care provider to understand the risks and benefits. HOME CARE INSTRUCTIONS  Have your blood pressure rechecked as directed by your health care provider.   Take medicines only as directed by your health care provider. Follow the directions carefully. Blood pressure medicines must be taken as prescribed. The medicine does not work as well when you skip doses. Skipping doses also puts you at risk for problems.   Do not smoke.   Monitor your blood pressure at home as directed by your health care provider. SEEK MEDICAL CARE IF:   You think you are  having a reaction to medicines taken.  You have recurrent headaches or feel dizzy.  You have swelling in your ankles.  You have trouble with your vision. SEEK IMMEDIATE MEDICAL CARE IF:  You develop a severe headache or confusion.  You have unusual weakness, numbness, or feel faint.  You have severe chest or abdominal  pain.  You vomit repeatedly.  You have trouble breathing. MAKE SURE YOU:   Understand these instructions.  Will watch your condition.  Will get help right away if you are not doing well or get worse. Document Released: 08/24/2005 Document Revised: 01/08/2014 Document Reviewed: 06/16/2013 [facility] Patient Information 2015 [place], [place]. This information is not intended to replace advice given to you by your health care provider. Make sure you discuss any questions you have with your health care provider.

## 2015-03-26 NOTE — Progress Notes (Signed)
[name] is a 54 y.o. female who presents to [facility]  today for hypertension.  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 She has a reevaluation pending in the next few weeks. She was found to have persistently elevated blood pressure. She is advised to restart blood pressure medication per her neurologist. She denies any chest pains or palpitations fevers or chills. She feels asymptomatic.   Past Medical History  Diagnosis Date  . Hypertension    Past Surgical History  Procedure Laterality Date  . Cessarian N/A   . Radiology with anesthesia N/A 08/14/2014    Procedure: Embolization   (RADIOLOGY WITH ANESTHESIA) ;  Surgeon: Medication Radiologist, MD;  Location: [facility] OR;  Service: Radiology;  Laterality: N/A;  . Cesarean section     History  Substance Use Topics  . Smoking status: Former Smoker -- 1.00 packs/day  . Smokeless tobacco: Not on file  . Alcohol Use: 0.0 oz/week    0 Standard drinks or equivalent per week   ROS as above Medications: Current Outpatient Prescriptions  Medication Sig Dispense Refill  . atorvastatin (LIPITOR) 40 MG tablet Take 1 tablet (40 mg total) by mouth daily. 30 tablet 5  . lisinopril (PRINIVIL,ZESTRIL) 10 MG tablet Take 1 tablet (10 mg total) by mouth daily. 30 tablet 0  . omeprazole (PRILOSEC) 40 MG capsule Take 1 capsule (40 mg total) by mouth daily. 30 capsule 3   No current facility-administered medications for this visit.   Allergies  Allergen Reactions  . Aspirin     HIves  . Ibuprofen     Hives  . Iodine   . Lobster [Shellfish Allergy]      Exam:  BP 156/83 mmHg  Pulse 82  Wt 143 lb (64.864 kg) Gen: Well NAD HEENT: EOMI,  MMM Lungs: Normal work of breathing. CTABL Heart: RRR no MRG Abd: NABS, Soft. Nondistended, Nontender Exts: Brisk capillary refill, warm and well perfused.   Lab Results  Component Value Date   CREATININE 0.77 11/01/2014   CREATININE 0.54  08/27/2014   CREATININE 0.62 08/20/2014     No results found for this or any previous visit (from the past 24 hour(s)). No results found.   Please see individual assessment and plan sections.

## 2015-04-01 ENCOUNTER — Other Ambulatory Visit: Payer: Self-pay

## 2015-04-01 MED ORDER — LOSARTAN POTASSIUM 50 MG PO TABS
50.0000 mg | ORAL_TABLET | Freq: Every day | ORAL | Status: DC
Start: 2015-04-01 — End: 2015-04-10

## 2015-04-01 NOTE — Telephone Encounter (Signed)
Pt called stating that lisinopril is causing her to cough throughout the day and at night. Switched to losartan per Dr. Denyse Amass. Advised pt to follow up in 1 week for labs and BP check.

## 2015-04-10 ENCOUNTER — Encounter: Payer: Self-pay | Admitting: Family Medicine

## 2015-04-10 ENCOUNTER — Ambulatory Visit (INDEPENDENT_AMBULATORY_CARE_PROVIDER_SITE_OTHER): Payer: Managed Care, Other (non HMO) | Admitting: Family Medicine

## 2015-04-10 VITALS — BP 156/78 | HR 76 | Ht 63.75 in | Wt 145.0 lb

## 2015-04-10 DIAGNOSIS — I10 Essential (primary) hypertension: Secondary | ICD-10-CM

## 2015-04-10 MED ORDER — LOSARTAN POTASSIUM-HCTZ 50-12.5 MG PO TABS: 1.0000 | ORAL_TABLET | Freq: Every day | ORAL | Status: DC

## 2015-04-10 NOTE — Assessment & Plan Note (Signed)
Not yet at goal. Add hydrochlorothiazide. We'll add 12.5 mg. Check BMP today. Return in a few weeks to months for lab recheck and blood pressure recheck.

## 2015-04-10 NOTE — Progress Notes (Signed)
[name] is a 54 y.o. female who presents to [facility]  today for follow-up blood pressure. Patient was seen a few weeks ago and started on lisinopril for hypertension. She has a history of cerebral aneurysm and is having a surgery soon for it. She was unable to tolerate lisinopril due to cough and was switched to losartan. She tolerates this medicine much better. Her blood pressure work was checked and found to be 137/76. No chest pain shortness of breath fevers or chills.   Past Medical History  Diagnosis Date  . Hypertension    Past Surgical History  Procedure Laterality Date  . Cessarian N/A   . Radiology with anesthesia N/A 08/14/2014    Procedure: Embolization   (RADIOLOGY WITH ANESTHESIA) ;  Surgeon: Medication Radiologist, MD;  Location: [facility] OR;  Service: Radiology;  Laterality: N/A;  . Cesarean section     History  Substance Use Topics  . Smoking status: Former Smoker -- 1.00 packs/day  . Smokeless tobacco: Not on file  . Alcohol Use: 0.0 oz/week    0 Standard drinks or equivalent per week   ROS as above Medications: Current Outpatient Prescriptions  Medication Sig Dispense Refill  . atorvastatin (LIPITOR) 40 MG tablet Take 1 tablet (40 mg total) by mouth daily. 30 tablet 5  . omeprazole (PRILOSEC) 40 MG capsule Take 1 capsule (40 mg total) by mouth daily. 30 capsule 3  . losartan-hydrochlorothiazide (HYZAAR) 50-12.5 MG per tablet Take 1 tablet by mouth daily. 30 tablet 1   No current facility-administered medications for this visit.   Allergies  Allergen Reactions  . Aspirin     HIves  . Ibuprofen     Hives  . Iodine   . Lobster [Shellfish Allergy]      Exam:  BP 156/78 mmHg  Pulse 76  Ht 5' 3.75" (1.619 m)  Wt 145 lb (65.772 kg)  BMI 25.09 kg/m2 Gen: Well NAD HEENT: EOMI,  MMM Lungs: Normal work of breathing. CTABL Heart: RRR no MRG Abd: NABS, Soft. Nondistended, Nontender Exts: Brisk capillary refill, warm  and well perfused.   No results found for this or any previous visit (from the past 24 hour(s)). No results found.   Please see individual assessment and plan sections.

## 2015-04-10 NOTE — Patient Instructions (Signed)
Thank you for coming in today. Keep a blood pressure log.  Come back in 2 months if labs and blood pressure are ok.  Call or go to the emergency room if you get worse, have trouble breathing, have chest pains, or palpitations.   Hypertension Hypertension, commonly called high blood pressure, is when the force of blood pumping through your arteries is too strong. Your arteries are the blood vessels that carry blood from your heart throughout your body. A blood pressure reading consists of a higher number over a lower number, such as 110/72. The higher number (systolic) is the pressure inside your arteries when your heart pumps. The lower number (diastolic) is the pressure inside your arteries when your heart relaxes. Ideally you want your blood pressure below 120/80. Hypertension forces your heart to work harder to pump blood. Your arteries may become narrow or stiff. Having hypertension puts you at risk for heart disease, stroke, and other problems.  RISK FACTORS Some risk factors for high blood pressure are controllable. Others are not.  Risk factors you cannot control include:   Race. You may be at higher risk if you are African American.  Age. Risk increases with age.  Gender. Men are at higher risk than women before age 34 years. After age 14, women are at higher risk than men. Risk factors you can control include:  Not getting enough exercise or physical activity.  Being overweight.  Getting too much fat, sugar, calories, or salt in your diet.  Drinking too much alcohol. SIGNS AND SYMPTOMS Hypertension does not usually cause signs or symptoms. Extremely high blood pressure (hypertensive crisis) may cause headache, anxiety, shortness of breath, and nosebleed. DIAGNOSIS  To check if you have hypertension, your health care provider will measure your blood pressure while you are seated, with your arm held at the level of your heart. It should be measured at least twice using the same  arm. Certain conditions can cause a difference in blood pressure between your right and left arms. A blood pressure reading that is higher than normal on one occasion does not mean that you need treatment. If one blood pressure reading is high, ask your health care provider about having it checked again. TREATMENT  Treating high blood pressure includes making lifestyle changes and possibly taking medicine. Living a healthy lifestyle can help lower high blood pressure. You may need to change some of your habits. Lifestyle changes may include:  Following the DASH diet. This diet is high in fruits, vegetables, and whole grains. It is low in salt, red meat, and added sugars.  Getting at least 2 hours of brisk physical activity every week.  Losing weight if necessary.  Not smoking.  Limiting alcoholic beverages.  Learning ways to reduce stress. If lifestyle changes are not enough to get your blood pressure under control, your health care provider may prescribe medicine. You may need to take more than one. Work closely with your health care provider to understand the risks and benefits. HOME CARE INSTRUCTIONS  Have your blood pressure rechecked as directed by your health care provider.   Take medicines only as directed by your health care provider. Follow the directions carefully. Blood pressure medicines must be taken as prescribed. The medicine does not work as well when you skip doses. Skipping doses also puts you at risk for problems.   Do not smoke.   Monitor your blood pressure at home as directed by your health care provider. SEEK MEDICAL CARE IF:  You think you are having a reaction to medicines taken.  You have recurrent headaches or feel dizzy.  You have swelling in your ankles.  You have trouble with your vision. SEEK IMMEDIATE MEDICAL CARE IF:  You develop a severe headache or confusion.  You have unusual weakness, numbness, or feel faint.  You have severe chest  or abdominal pain.  You vomit repeatedly.  You have trouble breathing. MAKE SURE YOU:   Understand these instructions.  Will watch your condition.  Will get help right away if you are not doing well or get worse. Document Released: 08/24/2005 Document Revised: 01/08/2014 Document Reviewed: 06/16/2013 [facility] Patient Information 2015 [place], [place]. This information is not intended to replace advice given to you by your health care provider. Make sure you discuss any questions you have with your health care provider.

## 2015-04-11 LAB — BASIC METABOLIC PANEL WITH GFR
BUN: 12 mg/dL (ref 7–25)
CO2: 27 mmol/L (ref 20–31)
Calcium: 9.1 mg/dL (ref 8.6–10.4)
Chloride: 104 mmol/L (ref 98–110)
Creat: 0.9 mg/dL (ref 0.50–1.05)
GFR, Est African American: 83 mL/min (ref >=60)
GFR, Est Non African American: 72 mL/min (ref >=60)
Glucose, Bld: 87 mg/dL (ref 65–99)
Potassium: 4.4 mmol/L (ref 3.5–5.3)
SODIUM: 143 mmol/L (ref 135–146)

## 2015-04-11 NOTE — Progress Notes (Signed)
Quick Note:  Normal, no changes. ______ 

## 2015-04-16 ENCOUNTER — Other Ambulatory Visit (HOSPITAL_COMMUNITY): Payer: Self-pay | Admitting: Neurosurgery

## 2015-04-16 ENCOUNTER — Ambulatory Visit (HOSPITAL_COMMUNITY)
Admission: RE | Admit: 2015-04-16 | Discharge: 2015-04-16 | Disposition: A | Discharge status: Home / Self Care | Payer: Managed Care, Other (non HMO) | Source: Ambulatory Visit | Attending: Neurosurgery | Admitting: Neurosurgery

## 2015-04-16 DIAGNOSIS — Z883 Allergy status to other anti-infective agents status: Secondary | ICD-10-CM | POA: Insufficient documentation

## 2015-04-16 DIAGNOSIS — I609 Nontraumatic subarachnoid hemorrhage, unspecified: Secondary | ICD-10-CM | POA: Diagnosis not present

## 2015-04-16 DIAGNOSIS — Z87891 Personal history of nicotine dependence: Secondary | ICD-10-CM | POA: Insufficient documentation

## 2015-04-16 DIAGNOSIS — Z91013 Allergy to seafood: Secondary | ICD-10-CM | POA: Insufficient documentation

## 2015-04-16 DIAGNOSIS — I10 Essential (primary) hypertension: Secondary | ICD-10-CM | POA: Insufficient documentation

## 2015-04-16 DIAGNOSIS — Z48812 Encounter for surgical aftercare following surgery on the circulatory system: Secondary | ICD-10-CM | POA: Diagnosis not present

## 2015-04-16 LAB — CBC WITH DIFFERENTIAL/PLATELET
BASOS ABS: 0 10*3/uL (ref 0.0–0.1)
Basophils Relative: 0 % (ref 0–1)
EOS PCT: 2 % (ref 0–5)
Eosinophils Absolute: 0.1 10*3/uL (ref 0.0–0.7)
HCT: 37.7 % (ref 36.0–46.0)
Hemoglobin: 12.8 g/dL (ref 12.0–15.0)
LYMPHS ABS: 1.8 10*3/uL (ref 0.7–4.0)
Lymphocytes Relative: 29 % (ref 12–46)
MCH: 34 pg (ref 26.0–34.0)
MCHC: 34 g/dL (ref 30.0–36.0)
MCV: 100 fL (ref 78.0–100.0)
Monocytes Absolute: 0.3 10*3/uL (ref 0.1–1.0)
Monocytes Relative: 5 % (ref 3–12)
NEUTROS ABS: 3.9 10*3/uL (ref 1.7–7.7)
Neutrophils Relative %: 64 % (ref 43–77)
Platelets: 311 10*3/uL (ref 150–400)
RBC: 3.77 MIL/uL — AB (ref 3.87–5.11)
RDW: 12.8 % (ref 11.5–15.5)
WBC: 6.1 10*3/uL (ref 4.0–10.5)

## 2015-04-16 LAB — BASIC METABOLIC PANEL
ANION GAP: 8 (ref 5–15)
BUN: <5 mg/dL — ABNORMAL LOW (ref 6–20)
CHLORIDE: 104 mmol/L (ref 101–111)
CO2: 29 mmol/L (ref 22–32)
CREATININE: 0.76 mg/dL (ref 0.44–1.00)
Calcium: 9 mg/dL (ref 8.9–10.3)
GFR calc Af Amer: >60 mL/min (ref >60)
GFR calc non Af Amer: >60 mL/min (ref >60)
Glucose, Bld: 97 mg/dL (ref 65–99)
POTASSIUM: 4 mmol/L (ref 3.5–5.1)
Sodium: 141 mmol/L (ref 135–145)

## 2015-04-16 LAB — URINALYSIS, ROUTINE W REFLEX MICROSCOPIC
Bilirubin Urine: NEGATIVE
Glucose, UA: NEGATIVE mg/dL
HGB URINE DIPSTICK: NEGATIVE
Ketones, ur: NEGATIVE mg/dL
Nitrite: NEGATIVE
PROTEIN: NEGATIVE mg/dL
Specific Gravity, Urine: 1.006 (ref 1.005–1.030)
Urobilinogen, UA: 0.2 mg/dL (ref 0.0–1.0)
pH: 6.5 (ref 5.0–8.0)

## 2015-04-16 LAB — PROTIME-INR
INR: 1.03 (ref 0.00–1.49)
Prothrombin Time: 13.8 seconds (ref 11.6–15.2)

## 2015-04-16 LAB — URINE MICROSCOPIC-ADD ON

## 2015-04-16 LAB — APTT: APTT: 27 s (ref 24–37)

## 2015-04-16 MED ORDER — HEPARIN SODIUM (PORCINE) 1000 UNIT/ML IJ SOLN
INTRAMUSCULAR | Status: AC
  Filled 2015-04-16: qty 1

## 2015-04-16 MED ORDER — HEPARIN SODIUM (PORCINE) 1000 UNIT/ML IJ SOLN
INTRAMUSCULAR | Status: AC | PRN
  Administered 2015-04-16: 2000 [IU] via INTRAVENOUS

## 2015-04-16 MED ORDER — SODIUM CHLORIDE 0.9 % IV SOLN: INTRAVENOUS | Status: DC

## 2015-04-16 MED ORDER — IOHEXOL 300 MG/ML  SOLN
40.0000 mL | Freq: Once | INTRAMUSCULAR | Status: DC | PRN
Start: 2015-04-16 — End: 2015-04-17
  Administered 2015-04-16: 40 mL via INTRA_ARTERIAL
  Filled 2015-04-16: qty 40

## 2015-04-16 MED ORDER — HYDROCODONE-ACETAMINOPHEN 5-325 MG PO TABS
1.0000 | ORAL_TABLET | ORAL | Status: DC | PRN
Start: 2015-04-16 — End: 2015-04-17

## 2015-04-16 MED ORDER — FENTANYL CITRATE (PF) 100 MCG/2ML IJ SOLN
INTRAMUSCULAR | Status: AC | PRN
  Administered 2015-04-16: 25 ug via INTRAVENOUS

## 2015-04-16 MED ORDER — LIDOCAINE HCL 1 % IJ SOLN
INTRAMUSCULAR | Status: AC
  Filled 2015-04-16: qty 20

## 2015-04-16 MED ORDER — MIDAZOLAM HCL 2 MG/2ML IJ SOLN
INTRAMUSCULAR | Status: AC | PRN
  Administered 2015-04-16: 0.5 mg via INTRAVENOUS

## 2015-04-16 MED ORDER — HEPARIN SOD (PORK) LOCK FLUSH 100 UNIT/ML IV SOLN
INTRAVENOUS | Status: AC
  Filled 2015-04-16: qty 25

## 2015-04-16 MED ORDER — FENTANYL CITRATE (PF) 100 MCG/2ML IJ SOLN
INTRAMUSCULAR | Status: AC
  Filled 2015-04-16: qty 2

## 2015-04-16 MED ORDER — MIDAZOLAM HCL 2 MG/2ML IJ SOLN
INTRAMUSCULAR | Status: AC
  Filled 2015-04-16: qty 2

## 2015-04-16 NOTE — H&P (Signed)
CC:  Aneurysm  HPI: [name] is a 54 y.o. 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 She has made an excellent recovery and presents for routine f/u.  PMH: Past Medical History  Diagnosis Date  . Hypertension     PSH: Past Surgical History  Procedure Laterality Date  . Cessarian N/A   . Radiology with anesthesia N/A 08/14/2014    Procedure: Embolization   (RADIOLOGY WITH ANESTHESIA) ;  Surgeon: Medication Radiologist, MD;  Location: [facility] OR;  Service: Radiology;  Laterality: N/A;  . Cesarean section      SH: History  Substance Use Topics  . Smoking status: Former Smoker -- 1.00 packs/day  . Smokeless tobacco: Not on file  . Alcohol Use: 0.0 oz/week    0 Standard drinks or equivalent per week    MEDS: Prior to Admission medications   Medication Sig Start Date End Date Taking? Authorizing Provider  acetaminophen (TYLENOL) 500 MG tablet Take 1,000 mg by mouth every 6 (six) hours as needed for mild pain or moderate pain.   Yes Historical Provider, MD  atorvastatin (LIPITOR) 40 MG tablet Take 1 tablet (40 mg total) by mouth daily. 11/06/14  Yes Jade L Breeback, PA-C  calcium carbonate (OS-CAL) 600 MG TABS tablet Take 1,200 mg by mouth daily.   Yes Historical Provider, MD  cholecalciferol (VITAMIN D) 1000 UNITS tablet Take 2,000 Units by mouth daily.   Yes Historical Provider, MD  lisinopril (PRINIVIL,ZESTRIL) 10 MG tablet Take 10 mg by mouth daily. 03/26/15  Yes Historical Provider, MD  losartan (COZAAR) 50 MG tablet Take 50 mg by mouth daily.   Yes Historical Provider, MD  Multiple Vitamins-Minerals (MULTIVITAMIN ADULTS 50+) TABS Take 1 tablet by mouth daily.   Yes Historical Provider, MD  Omega-3 Fatty Acids (FISH OIL) 1200 MG CAPS Take 2,400 mg by mouth daily.   Yes Historical Provider, MD  OVER THE COUNTER MEDICATION Take 2 tablets by mouth daily. OTC Vitamin C 60 mg + Biotin 200   Yes Historical Provider, MD  pseudoephedrine-acetaminophen  (TYLENOL SINUS) 30-500 MG TABS Take 1 tablet by mouth 2 (two) times daily as needed (congestion).   Yes Historical Provider, MD  ranitidine (ZANTAC) 150 MG tablet Take 150 mg by mouth daily.   Yes Historical Provider, MD  losartan-hydrochlorothiazide (HYZAAR) 50-12.5 MG per tablet Take 1 tablet by mouth daily. Patient not taking: Reported on 04/11/2015 04/10/15   Rodolph Bong, MD  omeprazole (PRILOSEC) 40 MG capsule Take 1 capsule (40 mg total) by mouth daily. Patient not taking: Reported on 04/11/2015 03/26/15   Rodolph Bong, MD    ALLERGY: Allergies  Allergen Reactions  . Aspirin     HIves  . Ibuprofen     Hives  . Iodine   . Lobster [Shellfish Allergy]     ROS: ROS  NEUROLOGIC EXAM: Awake, alert, oriented Memory and concentration grossly intact Speech fluent, appropriate CN grossly intact Motor exam: Upper Extremities Deltoid Bicep Tricep Grip  Right 5/5 5/5 5/5 5/5  Left 5/5 5/5 5/5 5/5   Lower Extremity IP Quad PF DF EHL  Right 5/5 5/5 5/5 5/5 5/5  Left 5/5 5/5 5/5 5/5 5/5   Sensation grossly intact to LT  IMPRESSION: - 54 y.o. female s/p SAH and coiling of ruptured PICA aneurysm  PLAN: - Proceed with f/u diagnostic cerebral angiogram - Likely home post-procedure  I have reviewed the indications, risks, and benefits of the procedure with the  patient in the office. All questions were answered, and consent was obtained.

## 2015-04-16 NOTE — Sedation Documentation (Signed)
Patient denies pain and is resting comfortably.  

## 2015-04-16 NOTE — Discharge Instructions (Signed)

## 2015-04-16 NOTE — Op Note (Signed)
DIAGNOSTIC CEREBRAL ANGIOGRAM    OPERATOR:   Dr. [name], MD  HISTORY:   The patient is a 54 y.o. 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 The patient has made an excellent recovery, and presents today for follow-up diagnostic cerebral angiogram.  APPROACH:   The technical aspects of the procedure as well as its potential risks and benefits were reviewed with the patient. These risks included but were not limited bleeding, infection, allergic reaction, damage to organs/vital structures, stroke, non-diagnostic procedure, and the catastrophic outcomes of heart attack, coma, and death. With an understanding of these risks, informed consent was obtained and witnessed.    The patient was placed in the supine position on the angiography table and the skin of right groin prepped in the usual sterile fashion. The procedure was performed under local anesthesia (1%-solution of bicarbonate-bufferred Lidoacaine) and conscious sedation with Versed and fentanyl monitored by the in-suite nurse.    A 5- French sheath was introduced in the right common femoral artery using Seldinger technique.  A fluorophase sequence was used to document the sheath position.    HEPARIN: 2000 Units total.   CONTRAST AGENT: 50cc, Omnipaque 300   FLUOROSCOPY TIME: 4.9 combined AP and lateral minutes    CATHETER(S) AND WIRE(S):    5-French JB-1 glidecatheter   0.035" glidewire    VESSELS CATHETERIZED:   Right common carotid   Left common carotid   Right vertebral   Left vertebral   Right common femoral  VESSELS STUDIED:   Right common carotid, head Right vertebral Left common carotid, head Left vertebral Right femoral  PROCEDURAL NARRATIVE:   A 5-Fr JB-1 terumo glide catheter was advanced over a 0.035 glidewire into the aortic arch. The above vessels were then sequentially catheterized and cervical/cerebral angiograms taken. After review of images,  the catheter was removed without incident.    INTERPRETATION:   Right common carotid: head:   Injection reveals the presence of a widely patent ICA, M1, and A1 segments and their branches. There is no significant stenosis, occlusion, aneurysm or high flow vascular malformation visualized.  Note is made of a widely patent anterior communicating artery, with filling of bilateral ACA territories from this right-sided injection.The parenchymal and venous phases are normal. The venous sinuses are widely patent.    Left common carotid: head:   Injection reveals the presence of a widely patent ICA and M1 segments and their branches. The left A1 is hypoplastic. There is no significant stenosis, occlusion, aneurysm, or high flow vascular malformation visualized. The parenchymal and venous phases are normal. The venous sinuses are widely patent.    Right vertebral:   Injection reveals the presence of a widely patent vertebral artery. This leads to a widely patent basilar artery that terminates in bilateral P1. The basilar apex is normal. previous coil mass is seen in the region of the right PICA origin. No aneurysm filling is seen. There is no stenosis of the vertebral artery or the PICA origin. Parenchymal and venous phases are normal. The venous sinuses are widely patent.   Left vertebral:    Normal vessel. No PICA aneurysm. See basilar description above.    Right femoral:    Normal vessel. No significant atherosclerotic disease. Arterial sheath in adequate position.   DISPOSITION:  Upon completion of the study, the femoral sheath was removed and hemostasis obtained using a 5-Fr ExoSeal closure device. Good proximal and distal lower extremity pulses were documented upon  achievement of hemostasis.    The procedure was well tolerated and no early complications were observed.       The patient was transferred back to the holding area to be positioned flat in bed for 3 hours of observation.     IMPRESSION:  1. 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   Preliminary results of this procedure were shared with the patient and the patient's family.

## 2015-05-14 ENCOUNTER — Ambulatory Visit (INDEPENDENT_AMBULATORY_CARE_PROVIDER_SITE_OTHER): Payer: Managed Care, Other (non HMO) | Admitting: Family Medicine

## 2015-05-14 VITALS — BP 135/78 | HR 83 | Temp 98.0°F | Wt 142.0 lb

## 2015-05-14 DIAGNOSIS — L237 Allergic contact dermatitis due to plants, except food: Secondary | ICD-10-CM | POA: Insufficient documentation

## 2015-05-14 MED ORDER — PREDNISONE 5 MG (48) PO TBPK: ORAL_TABLET | ORAL | Status: DC

## 2015-05-14 MED ORDER — TRIAMCINOLONE ACETONIDE 0.5 % EX OINT: 1.0000 "application " | TOPICAL_OINTMENT | Freq: Two times a day (BID) | CUTANEOUS | Status: DC

## 2015-05-14 MED ORDER — MUPIROCIN 2 % EX OINT: 1.0000 "application " | TOPICAL_OINTMENT | Freq: Three times a day (TID) | CUTANEOUS | Status: DC

## 2015-05-14 NOTE — Patient Instructions (Signed)
Thank you for coming in today. Use triamcinolone ointment and mupirocin ointment twice daily or 3 times daily Start taking prednisone if not getting better Call or go to the emergency room if you get worse, have trouble breathing, have chest pains, or palpitations.   Poison [employer] ivy is a inflammation of the skin (contact dermatitis) caused by touching the allergens on the leaves of the ivy plant following previous exposure to the plant. The rash usually appears 48 hours after exposure. The rash is usually bumps (papules) or blisters (vesicles) in a linear pattern. Depending on your own sensitivity, the rash may simply cause redness and itching, or it may also progress to blisters which may break open. These must be well cared for to prevent secondary bacterial (germ) infection, followed by scarring. Keep any open areas dry, clean, dressed, and covered with an antibacterial ointment if needed. The eyes may also get puffy. The puffiness is worst in the morning and gets better as the day progresses. This dermatitis usually heals without scarring, within 2 to 3 weeks without treatment. HOME CARE INSTRUCTIONS  Thoroughly wash with soap and water as soon as you have been exposed to poison ivy. You have about one half hour to remove the plant resin before it will cause the rash. This washing will destroy the oil or antigen on the skin that is causing, or will cause, the rash. Be sure to wash under your fingernails as any plant resin there will continue to spread the rash. Do not rub skin vigorously when washing affected area. Poison ivy cannot spread if no oil from the plant remains on your body. A rash that has progressed to weeping sores will not spread the rash unless you have not washed thoroughly. It is also important to wash any clothes you have been wearing as these may carry active allergens. The rash will return if you wear the unwashed clothing, even several days later. Avoidance of the plant in  the future is the best measure. Poison ivy plant can be recognized by the number of leaves. Generally, poison ivy has three leaves with flowering branches on a single stem. Diphenhydramine may be purchased over the counter and used as needed for itching. Do not drive with this medication if it makes you drowsy.Ask your caregiver about medication for children. SEEK MEDICAL CARE IF:  Open sores develop.  Redness spreads beyond area of rash.  You notice purulent (pus-like) discharge.  You have increased pain.  Other signs of infection develop (such as fever). Document Released: 08/21/2000 Document Revised: 11/16/2011 Document Reviewed: 02/01/2009 [facility] Patient Information 2015 [place], [place]. This information is not intended to replace advice given to you by your health care provider. Make sure you discuss any questions you have with your health care provider.

## 2015-05-14 NOTE — Assessment & Plan Note (Signed)
Rash most likely related to poison ivy dermatitis. Treat with topical triamcinolone ointment. She may also have bacterial secondary infection which we will treat with mupirocin ointment. Use prednisone if not improved. Return if not better.
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
## 2015-05-22 ENCOUNTER — Other Ambulatory Visit: Payer: Self-pay | Admitting: Physician Assistant

## 2015-06-19 ENCOUNTER — Other Ambulatory Visit: Payer: Self-pay | Admitting: Physician Assistant

## 2015-06-20 ENCOUNTER — Other Ambulatory Visit: Payer: Self-pay | Admitting: Physician Assistant

## 2015-06-20 MED ORDER — ATORVASTATIN CALCIUM 40 MG PO TABS: 40.0000 mg | ORAL_TABLET | Freq: Every day | ORAL | Status: DC

## 2015-06-20 NOTE — Telephone Encounter (Signed)
Per insurance, 90 day supply requested.

## 2015-06-29 ENCOUNTER — Other Ambulatory Visit: Payer: Self-pay | Admitting: Physician Assistant

## 2015-07-01 ENCOUNTER — Telehealth: Payer: Self-pay | Admitting: Physician Assistant

## 2015-07-01 NOTE — Telephone Encounter (Signed)
[facility] and I adv will send a phone note. Thanks

## 2015-07-02 ENCOUNTER — Ambulatory Visit (INDEPENDENT_AMBULATORY_CARE_PROVIDER_SITE_OTHER): Payer: Managed Care, Other (non HMO) | Admitting: Family Medicine

## 2015-07-02 ENCOUNTER — Ambulatory Visit (INDEPENDENT_AMBULATORY_CARE_PROVIDER_SITE_OTHER): Payer: Managed Care, Other (non HMO)

## 2015-07-02 ENCOUNTER — Other Ambulatory Visit: Payer: Self-pay | Admitting: *Deleted

## 2015-07-02 ENCOUNTER — Encounter: Payer: Self-pay | Admitting: Family Medicine

## 2015-07-02 VITALS — BP 142/65 | HR 71 | Wt 144.0 lb

## 2015-07-02 DIAGNOSIS — R5383 Other fatigue: Secondary | ICD-10-CM | POA: Diagnosis not present

## 2015-07-02 DIAGNOSIS — R0602 Shortness of breath: Secondary | ICD-10-CM | POA: Diagnosis not present

## 2015-07-02 DIAGNOSIS — I10 Essential (primary) hypertension: Secondary | ICD-10-CM | POA: Diagnosis not present

## 2015-07-02 DIAGNOSIS — R079 Chest pain, unspecified: Secondary | ICD-10-CM | POA: Diagnosis not present

## 2015-07-02 DIAGNOSIS — R0789 Other chest pain: Secondary | ICD-10-CM | POA: Diagnosis not present

## 2015-07-02 DIAGNOSIS — I2089 Other forms of angina pectoris: Secondary | ICD-10-CM | POA: Insufficient documentation

## 2015-07-02 DIAGNOSIS — I208 Other forms of angina pectoris: Secondary | ICD-10-CM | POA: Diagnosis not present

## 2015-07-02 MED ORDER — NITROGLYCERIN 0.3 MG SL SUBL: 0.3000 mg | SUBLINGUAL_TABLET | SUBLINGUAL | Status: DC | PRN

## 2015-07-02 MED ORDER — LOSARTAN POTASSIUM-HCTZ 50-12.5 MG PO TABS: 1.0000 | ORAL_TABLET | Freq: Every day | ORAL | Status: DC

## 2015-07-02 NOTE — Telephone Encounter (Signed)
Ok. Please change providers in EMR.

## 2015-07-02 NOTE — Assessment & Plan Note (Addendum)
Current symptoms are highly concerning for stable angina. I am doubtful she would pass a stress test therefore refer directly to cardiology for evaluation and potential cardiac catheterization. She does have new EKG changes. I'm doubtful this is unstable angina or acute coronary syndrome. I feel it is reasonably safe to proceed with outpatient evaluation. Additionally we'll obtain CBC CMP and chest x-ray. These tests are pending currently.  She has an appointment with cardiology tomorrow.

## 2015-07-02 NOTE — Patient Instructions (Addendum)
Thank you for coming in today. We will call you likely today with the time of your cardiology appointment.  If you get worse or have chest pain especially at rest call 911 and go to the ER.  Call or go to the emergency room if you get worse, have trouble breathing, have chest pains, or palpitations.  Take nitroglycerine if you have chest pain.   Angina Pectoris Angina pectoris, often called angina, is extreme discomfort in the chest, neck, or arm. This is caused by a lack of blood in the middle and thickest layer of the heart wall (myocardium). There are four types of angina:  Stable angina. Stable angina usually occurs in episodes of predictable frequency and duration. It is usually brought on by physical activity, stress, or excitement. Stable angina usually lasts a few minutes and can often be relieved by a medicine that you place under your tongue. This medicine is called sublingual nitroglycerin.  Unstable angina. Unstable angina can occur even when you are doing little or no physical activity. It can even occur while you are sleeping or when you are at rest. It can suddenly increase in severity or frequency. It may not be relieved by sublingual nitroglycerin, and it can last up to 30 minutes.  Microvascular angina. This type of angina is caused by a disorder of tiny blood vessels called arterioles. Microvascular angina is more common in women. The pain may be more severe and last longer than other types of angina pectoris.  Prinzmetal or variant angina. This type of angina pectoris is rare and usually occurs when you are doing little or no physical activity. It especially occurs in the early morning hours. CAUSES Atherosclerosis is the cause of angina. This is the buildup of fat and cholesterol (plaque) on the inside of the arteries. Over time, the plaque may narrow or block the artery, and this will lessen blood flow to the heart. Plaque can also become weak and break off within a coronary  artery to form a clot and cause a sudden blockage. RISK FACTORS Risk factors common to both men and women include:  High cholesterol levels.  High blood pressure (hypertension).  Tobacco use.  Diabetes.  Family history of angina.  Obesity.  Lack of exercise.  A diet high in saturated fats. Women are at greater risk for angina if they are:  Over age 33.  Postmenopausal. SYMPTOMS Many people do not experience any symptoms during the early stages of angina. As the condition progresses, symptoms common to both men and women may include:  Chest pain.  The pain can be described as a crushing or squeezing in the chest, or a tightness, pressure, fullness, or heaviness in the chest.  The pain can last more than a few minutes, or it can stop and recur.  Pain in the arms, neck, jaw, or back.  Unexplained heartburn or indigestion.  Shortness of breath.  Nausea.  Sudden cold sweats.  Sudden light-headedness. Many women have chest discomfort and some of the other symptoms. However, women often have different (atypical) symptoms, such as:   Fatigue.  Unexplained feelings of nervousness or anxiety.  Unexplained weakness.  Dizziness or fainting. Sometimes, women may have angina without any symptoms. DIAGNOSIS  Tests to diagnose angina may include:  ECG (electrocardiogram).  Exercise stress test. This looks for signs of blockage when the heart is being exercised.  Pharmacologic stress test. This test looks for signs of blockage when the heart is being stressed with a medicine.  Blood tests.  Coronary angiogram. This is a procedure to look at the coronary arteries to see if there is any blockage. TREATMENT  The treatment of angina may include the following:  Healthy behavioral changes to reduce or control risk factors.  Medicine.  Coronary stenting.A stent helps to keep an artery open.  Coronary angioplasty. This procedure widens a narrowed or blocked  artery.  Coronary arterybypass surgery. This will allow your blood to pass the blockage (bypass) to reach your heart. HOME CARE INSTRUCTIONS   Take medicines only as directed by your health care provider.  Do not take the following medicines unless your health care provider approves:  Nonsteroidal anti-inflammatory drugs (NSAIDs), such as ibuprofen, naproxen, or celecoxib.  Vitamin supplements that contain vitamin A, vitamin E, or both.  Hormone replacement therapy that contains estrogen with or without progestin.  Manage other health conditions such as hypertension and diabetes as directed by your health care provider.  Follow a heart-healthy diet. A dietitian can help to educate you about healthy food options and changes.  Use healthy cooking methods such as roasting, grilling, broiling, baking, poaching, steaming, or stir-frying. Talk to a dietitian to learn more about healthy cooking methods.  Follow an exercise program approved by your health care provider.  Maintain a healthy weight. Lose weight as approved by your health care provider.  Plan rest periods when fatigued.  Learn to manage stress.  Do not use any tobacco products, including cigarettes, chewing tobacco, or electronic cigarettes. If you need help quitting, ask your health care provider.  If you drink alcohol, and your health care provider approves, limit your alcohol intake to no more than 1 drink per day. One drink equals 12 ounces of beer, 5 ounces of wine, or 1 ounces of hard liquor.  Stop illegal drug use.  Keep all follow-up visits as directed by your health care provider. This is important. SEEK IMMEDIATE MEDICAL CARE IF:   You have pain in your chest, neck, arm, jaw, stomach, or back that lasts more than a few minutes, is recurring, or is unrelieved by taking sublingualnitroglycerin.  You have profuse sweating without cause.  You have unexplained:  Heartburn or indigestion.  Shortness of  breath or difficulty breathing.  Nausea or vomiting.  Fatigue.  Feelings of nervousness or anxiety.  Weakness.  Diarrhea.  You have sudden light-headedness or dizziness.  You faint. These symptoms may represent a serious problem that is an emergency. Do not wait to see if the symptoms will go away. Get medical help right away. Call your local emergency services (911 in the [place] Do not drive yourself to the hospital.   This information is not intended to replace advice given to you by your health care provider. Make sure you discuss any questions you have with your health care provider.   Document Released: 08/24/2005 Document Revised: 09/14/2014 Document Reviewed: 12/26/2013 Elsevier Interactive Patient Education [employer]2016 [employer] Inc.

## 2015-07-02 NOTE — Progress Notes (Signed)
[name] is a 54 y.o. female who presents to [name]  today for chest pain. Patient notes a insidious onset exertional chest pain and shortness of breath and exhaustion over the past greater than 6 months. It's been slightly worsening over the past several months. She really noticed her exertional chest pain and exhaustion over the weekend which she attempted to go hiking and was not able to. She is careful to stress that the pain and exertional problems did not worsen she was just noticing it because she was attempting to exert whirling she normally does. She does note that she is unable to make it up 1 flight of stairs over the past several months without having to rest. She denies any pain at rest nor does she have palpitations or significant shortness of breath at rest. She denies any fevers or chills nausea vomiting or diarrhea. 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 No leg swelling or orthopnea. No recent immobilization or injury.  Additionally she notes that she's been out of her blood pressure medications for some time. She tolerated it well when she took it.   Past Medical History  Diagnosis Date  . Hypertension    Past Surgical History  Procedure Laterality Date  . Cessarian N/A   . Radiology with anesthesia N/A 08/14/2014    Procedure: Embolization   (RADIOLOGY WITH ANESTHESIA) ;  Surgeon: Medication Radiologist, MD;  Location: [facility] OR;  Service: Radiology;  Laterality: N/A;  . Cesarean section     Social History  Substance Use Topics  . Smoking status: Former Smoker -- 1.00 packs/day  . Smokeless tobacco: Not on file  . Alcohol Use: 0.0 oz/week    0 Standard drinks or equivalent per week   family history includes Alcohol abuse in her father and mother; Aneurysm in her father, mother, sister, and sister; Cancer in her mother;  Hyperlipidemia in her mother and sister; Hypertension in her mother and sister; Stroke in her father.  ROS as above Medications: Current Outpatient Prescriptions  Medication Sig Dispense Refill  . acetaminophen (TYLENOL) 500 MG tablet Take 1,000 mg by mouth every 6 (six) hours as needed for mild pain or moderate pain.    Marland Kitchen atorvastatin (LIPITOR) 40 MG tablet Take 1 tablet (40 mg total) by mouth daily. Last cholesterol 2/16. Call office for lab order 90 tablet 0  . losartan-hydrochlorothiazide (HYZAAR) 50-12.5 MG tablet Take 1 tablet by mouth daily. 30 tablet 1  . Multiple Vitamins-Minerals (MULTIVITAMIN ADULTS 50+) TABS Take 1 tablet by mouth daily.    . Omega-3 Fatty Acids (FISH OIL) 1200 MG CAPS Take 2,400 mg by mouth daily.    Marland Kitchen omeprazole (PRILOSEC) 40 MG capsule Take 1 capsule (40 mg total) by mouth daily. 30 capsule 3  . pseudoephedrine-acetaminophen (TYLENOL SINUS) 30-500 MG TABS Take 1 tablet by mouth 2 (two) times daily as needed (congestion).    . calcium carbonate (OS-CAL) 600 MG TABS tablet Take 1,200 mg by mouth daily.    . cholecalciferol (VITAMIN D) 1000 UNITS tablet Take 2,000 Units by mouth daily.    . nitroGLYCERIN (NITROSTAT) 0.3 MG SL tablet Place 1 tablet (0.3 mg total) under the tongue every 5 (five) minutes as needed for chest pain. 90 tablet 12  . OVER THE COUNTER MEDICATION Take 2 tablets by mouth daily. OTC Vitamin C 60 mg + Biotin 200  No current facility-administered medications for this visit.   Allergies  Allergen Reactions  . Aspirin     HIves  . Ibuprofen     Hives  . Iodine   . Lobster [Shellfish Allergy]      Exam:  BP 142/65 mmHg  Pulse 71  Wt 144 lb (65.318 kg) Gen: Well NAD HEENT: EOMI,  MMM Lungs: Normal work of breathing. CTABL Heart: RRR no MRG Abd: NABS, Soft. Nondistended, Nontender Exts: Brisk capillary refill, warm and well perfused.  No significant swelling  Twelve-lead EKG shows sinus rhythm at 68 bpm. She has no significant  ST segment elevation or depression. She does have inverted T waves in leads V3-V6. This is a change from her prior EKG dated 08/13/2014. She does have a QTc prolongation of 510 ms which is not significantly changed from prior EKG. Changed EKG.  No results found for this or any previous visit (from the past 24 hour(s)). No results found.   Please see individual assessment and plan sections.

## 2015-07-02 NOTE — Assessment & Plan Note (Signed)
Elevated as she is out of blood pressure medicines. Refill Hyzaar. Check metabolic panel. Return in one month for reevaluation.

## 2015-07-02 NOTE — Progress Notes (Signed)
Quick Note:  Xray was normal. ______ 

## 2015-07-03 ENCOUNTER — Ambulatory Visit (INDEPENDENT_AMBULATORY_CARE_PROVIDER_SITE_OTHER): Payer: Managed Care, Other (non HMO) | Admitting: Cardiology

## 2015-07-03 ENCOUNTER — Encounter: Payer: Self-pay | Admitting: Cardiology

## 2015-07-03 ENCOUNTER — Encounter: Payer: Self-pay | Admitting: *Deleted

## 2015-07-03 VITALS — BP 129/78 | HR 80 | Ht 64.0 in | Wt 143.0 lb

## 2015-07-03 DIAGNOSIS — E785 Hyperlipidemia, unspecified: Secondary | ICD-10-CM | POA: Diagnosis not present

## 2015-07-03 DIAGNOSIS — I208 Other forms of angina pectoris: Secondary | ICD-10-CM

## 2015-07-03 DIAGNOSIS — R0600 Dyspnea, unspecified: Secondary | ICD-10-CM | POA: Diagnosis not present

## 2015-07-03 LAB — PROTIME-INR
INR: 0.95 (ref <1.50)
Prothrombin Time: 12.8 seconds (ref 11.6–15.2)

## 2015-07-03 LAB — COMPLETE METABOLIC PANEL WITH GFR
ALK PHOS: 115 U/L (ref 33–130)
ALT: 11 U/L (ref 6–29)
AST: 18 U/L (ref 10–35)
Albumin: 4.1 g/dL (ref 3.6–5.1)
BUN: 12 mg/dL (ref 7–25)
CALCIUM: 9.1 mg/dL (ref 8.6–10.4)
CHLORIDE: 102 mmol/L (ref 98–110)
CO2: 30 mmol/L (ref 20–31)
CREATININE: 0.88 mg/dL (ref 0.50–1.05)
GFR, Est African American: 85 mL/min (ref >=60)
GFR, Est Non African American: 74 mL/min (ref >=60)
Glucose, Bld: 87 mg/dL (ref 65–99)
Potassium: 3.8 mmol/L (ref 3.5–5.3)
Sodium: 138 mmol/L (ref 135–146)
Total Bilirubin: 0.5 mg/dL (ref 0.2–1.2)
Total Protein: 6.6 g/dL (ref 6.1–8.1)

## 2015-07-03 LAB — BASIC METABOLIC PANEL
BUN: 9 mg/dL (ref 7–25)
CHLORIDE: 102 mmol/L (ref 98–110)
CO2: 28 mmol/L (ref 20–31)
Calcium: 9.9 mg/dL (ref 8.6–10.4)
Creat: 0.76 mg/dL (ref 0.50–1.05)
Glucose, Bld: 98 mg/dL (ref 65–99)
POTASSIUM: 4.5 mmol/L (ref 3.5–5.3)
SODIUM: 142 mmol/L (ref 135–146)

## 2015-07-03 LAB — CBC
HCT: 35.1 % — ABNORMAL LOW (ref 36.0–46.0)
HEMATOCRIT: 31.7 % — AB (ref 36.0–46.0)
Hemoglobin: 11.1 g/dL — ABNORMAL LOW (ref 12.0–15.0)
Hemoglobin: 12.2 g/dL (ref 12.0–15.0)
MCH: 34.1 pg — ABNORMAL HIGH (ref 26.0–34.0)
MCH: 34.2 pg — ABNORMAL HIGH (ref 26.0–34.0)
MCHC: 34.8 g/dL (ref 30.0–36.0)
MCHC: 35 g/dL (ref 30.0–36.0)
MCV: 97.5 fL (ref 78.0–100.0)
MCV: 98 fL (ref 78.0–100.0)
MPV: 9.4 fL (ref 8.6–12.4)
MPV: 9.4 fL (ref 8.6–12.4)
PLATELETS: 370 10*3/uL (ref 150–400)
PLATELETS: 366 10*3/uL (ref 150–400)
RBC: 3.25 MIL/uL — ABNORMAL LOW (ref 3.87–5.11)
RBC: 3.58 MIL/uL — ABNORMAL LOW (ref 3.87–5.11)
RDW: 13.6 % (ref 11.5–15.5)
RDW: 13.7 % (ref 11.5–15.5)
WBC: 6.9 10*3/uL (ref 4.0–10.5)
WBC: 8 10*3/uL (ref 4.0–10.5)

## 2015-07-03 LAB — D-DIMER, QUANTITATIVE (NOT AT ARMC): D DIMER QUANT: 0.27 ug{FEU}/mL (ref 0.00–0.48)

## 2015-07-03 MED ORDER — PREDNISONE 20 MG PO TABS
ORAL_TABLET | ORAL | Status: DC
Start: 2015-07-03 — End: 2015-11-11

## 2015-07-03 MED ORDER — FAMOTIDINE 20 MG PO TABS
ORAL_TABLET | ORAL | Status: DC
Start: 2015-07-03 — End: 2015-11-11

## 2015-07-03 MED ORDER — CLOPIDOGREL BISULFATE 75 MG PO TABS: 75.0000 mg | ORAL_TABLET | Freq: Every day | ORAL | Status: DC

## 2015-07-03 NOTE — Progress Notes (Signed)
HPI: 54 yo female for evaluation of chest pain. 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 Follow-up angiogram August 2016 showed persistent occlusion. Echocardiogram December 2015 showed vigorous LV function. Mild left ventricular hypertrophy. Patient states that for the past year she has had increasing dyspnea on exertion. There is no orthopnea, PND, pedal edema. She also has had occasions of sharp chest pain and pressure with activities relieved with rest. The pain is not pleuritic, positional or related to food. She noticed this more severely when [place] [facility] [place]. She has had no chest pain in the past 2-3 weeks.  Current Outpatient Prescriptions  Medication Sig Dispense Refill  . acetaminophen (TYLENOL) 500 MG tablet Take 1,000 mg by mouth every 6 (six) hours as needed for mild pain or moderate pain.    Marland Kitchen. atorvastatin (LIPITOR) 40 MG tablet Take 1 tablet (40 mg total) by mouth daily. Last cholesterol 2/16. Call office for lab order 90 tablet 0  . calcium carbonate (OS-CAL) 600 MG TABS tablet Take 1,200 mg by mouth daily.    . cholecalciferol (VITAMIN D) 1000 UNITS tablet Take 2,000 Units by mouth daily.    Marland Kitchen. losartan-hydrochlorothiazide (HYZAAR) 50-12.5 MG tablet Take 1 tablet by mouth daily. 30 tablet 1  . Multiple Vitamins-Minerals (MULTIVITAMIN ADULTS 50+) TABS Take 1 tablet by mouth daily.    . nitroGLYCERIN (NITROSTAT) 0.3 MG SL tablet Place 1 tablet (0.3 mg total) under the tongue every 5 (five) minutes as needed for chest pain. 90 tablet 12  . Omega-3 Fatty Acids (FISH OIL) 1200 MG CAPS Take 2,400 mg by mouth daily.    Marland Kitchen. omeprazole (PRILOSEC) 40 MG capsule Take 1 capsule (40 mg total) by mouth daily. 30 capsule 3  . OVER THE COUNTER MEDICATION Take 2 tablets by mouth daily. OTC Vitamin C 60 mg + Biotin 200    . pseudoephedrine-acetaminophen (TYLENOL SINUS) 30-500 MG TABS Take 1 tablet by mouth 2 (two)  times daily as needed (congestion).     No current facility-administered medications for this visit.    Allergies  Allergen Reactions  . Aspirin     HIves  . Ibuprofen     Hives  . Iodine   . Lobster [Shellfish Allergy]      Past Medical History  Diagnosis Date  . Hypertension   . Hyperlipidemia 09/05/2014  . Primary osteoarthritis of both hips 11/07/2014  . 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 Doristine SectionSubramanian. Given contacts and glasses. Follow up yearly.    . Non-traumatic intracranial subarachnoid hemorrhage (HCC)   . Former smoker 09/03/2014    Quit date 08/13/14   . Diplopia 09/05/2014    Resolved. Dr. Doristine SectionSubramanian.    . DDD (degenerative disc disease), lumbar 11/06/2014  . COPD (chronic obstructive pulmonary disease) (HCC) 09/05/2014    Per notes from ED.      Past Surgical History  Procedure Laterality Date  . Cesarean section N/A   . Radiology with anesthesia N/A 08/14/2014    Procedure: Embolization   (RADIOLOGY WITH ANESTHESIA) ;  Surgeon: Medication Radiologist, MD;  Location: [facility] OR;  Service: Radiology;  Laterality: N/A;    Social History   Social History  . Marital Status: Single    Spouse Name: N/A  . Number of Children: 2  . Years of Education: N/A   Occupational History  . Not on file.   Social History Main Topics  . Smoking status:  Former Smoker -- 1.00 packs/day  . Smokeless tobacco: Not on file  . Alcohol Use: 0.0 oz/week    0 Standard drinks or equivalent per week     Comment: 2/3 drinks per day  . Drug Use: No  . Sexual Activity: Yes   Other Topics Concern  . Not on file   Social History Narrative    Family History  Problem Relation Age of Onset  . Alcohol abuse Mother   . Breast cancer Mother   . Hyperlipidemia Mother   . Hypertension Mother   . Aneurysm Mother   . Alcohol abuse Father   . Stroke Father   . Cerebral aneurysm Father     RUPTURE  . Hyperlipidemia Sister   . Hypertension Sister   . Aneurysm Sister     . Aneurysm Sister     ROS: no fevers or chills, productive cough, hemoptysis, dysphasia, odynophagia, melena, hematochezia, dysuria, hematuria, rash, seizure activity, orthopnea, PND, pedal edema, claudication. Remaining systems are negative.  Physical Exam:   Blood pressure 129/78, pulse 80, height 5' 4" (1.626 m), weight 64.864 kg (143 lb).  General:  Well developed/well nourished in NAD Skin warm/dry, Tattoos Patient not depressed No peripheral clubbing Back-normal HEENT-normal/normal eyelids Neck supple/normal carotid upstroke bilaterally; no bruits; no JVD; no thyromegaly chest - CTA/ normal expansion CV - RRR/normal S1 and S2; no murmurs, rubs or gallops;  PMI nondisplaced Abdomen -NT/ND, no HSM, no mass, + bowel sounds, no bruit 2+ femoral pulses, no bruits Ext-no edema, chords, 2+ DP Neuro-grossly nonfocal  ECG 07/02/2015-sinus rhythm, deep anterior lateral T-wave inversion, prolonged QT interval. Changes are new compared to 12/057/2015.   

## 2015-07-03 NOTE — Addendum Note (Signed)
Addended by: Freddi StarrMATHIS, DEBRA W on: 07/03/2015 11:05 AM   Modules accepted: Orders

## 2015-07-03 NOTE — Assessment & Plan Note (Signed)
Continue statin. 

## 2015-07-03 NOTE — Assessment & Plan Note (Signed)
Blood pressure controlled. Continue present medications. 

## 2015-07-03 NOTE — Patient Instructions (Addendum)
Your physician has requested that you have a cardiac catheterization. Cardiac catheterization is used to diagnose and/or treat various heart conditions. Doctors may recommend this procedure for a number of different reasons. The most common reason is to evaluate chest pain. Chest pain can be a symptom of coronary artery disease (CAD), and cardiac catheterization can show whether plaque is narrowing or blocking your heart's arteries. This procedure is also used to evaluate the valves, as well as measure the blood flow and oxygen levels in different parts of your heart. For further information please visit www.cardiosmart.org. Please follow instruction sheet, as given.   Your physician recommends that you HAVE LAB WORK TODAY 

## 2015-07-03 NOTE — Assessment & Plan Note (Signed)
Patient presents with symptoms that are concerning for angina. These have slowly progressed and she has significant dyspnea with moderate activities and chest tightness relieved with rest. Her electrocardiogram is markedly abnormal. She has anterior lateral T-wave inversion. These changes could certainly be related to her previous subarachnoid hemorrhage but we are now approximately 11 months since that event. I did perform a brief literature search and T-wave changes can persist for that long but this would seem unusual. Based on her symptoms I feel definitive evaluation is warranted. We will proceed with cardiac catheterization. The risks and benefits were discussed and the patient agrees to proceed. She does have an aspirin allergy. I discussed her case with her neurosurgeon Dr. Conchita ParisNundkumar and given that her previous subarachnoid hemorrhage was related to a small aneurysm which has been coiled he felt patient could be anticoagulated if needed. I will add Plavix 75 mg daily. If her catheterization is normal we will discontinue this medication. Note she does have an iodine allergy. We will premedicate with Benadryl, prednisone and Pepcid. I will also check a d-dimer given her recent travel. If elevated we will evaluate further for possible pulmonary embolus.

## 2015-07-03 NOTE — Progress Notes (Signed)
Quick Note:  Labs are ok for now. Mild anemia present that will need workup in the future. ______

## 2015-07-08 ENCOUNTER — Telehealth: Payer: Self-pay | Admitting: *Deleted

## 2015-07-08 MED ORDER — LANSOPRAZOLE 30 MG PO CPDR: 30.0000 mg | DELAYED_RELEASE_CAPSULE | Freq: Every day | ORAL | Status: DC

## 2015-07-08 NOTE — Telephone Encounter (Signed)
Returned your call . Please call .. Thanks  °

## 2015-07-08 NOTE — Telephone Encounter (Signed)
Left message for pt to call, we recently started the pt on plavix. Need to make sure she is not using omeprazole. She can take zantac

## 2015-07-08 NOTE — Telephone Encounter (Signed)
Spoke with pt, she will stop the omeprazole and start prevacid to avoid interaction with plavix

## 2015-07-09 NOTE — Telephone Encounter (Signed)
OK by me 

## 2015-07-12 ENCOUNTER — Encounter (HOSPITAL_COMMUNITY): Payer: Self-pay | Admitting: Interventional Cardiology

## 2015-07-12 ENCOUNTER — Encounter (HOSPITAL_COMMUNITY)
Admission: RE | Disposition: A | Discharge status: Home / Self Care | Payer: Self-pay | Source: Ambulatory Visit | Attending: Interventional Cardiology

## 2015-07-12 ENCOUNTER — Ambulatory Visit (HOSPITAL_COMMUNITY)
Admission: RE | Admit: 2015-07-12 | Discharge: 2015-07-12 | Disposition: A | Discharge status: Home / Self Care | Payer: Managed Care, Other (non HMO) | Source: Ambulatory Visit | Attending: Interventional Cardiology | Admitting: Interventional Cardiology

## 2015-07-12 DIAGNOSIS — I25119 Atherosclerotic heart disease of native coronary artery with unspecified angina pectoris: Secondary | ICD-10-CM | POA: Insufficient documentation

## 2015-07-12 DIAGNOSIS — M5136 Other intervertebral disc degeneration, lumbar region: Secondary | ICD-10-CM | POA: Diagnosis not present

## 2015-07-12 DIAGNOSIS — H532 Diplopia: Secondary | ICD-10-CM | POA: Diagnosis not present

## 2015-07-12 DIAGNOSIS — R9431 Abnormal electrocardiogram [ECG] [EKG]: Secondary | ICD-10-CM

## 2015-07-12 DIAGNOSIS — J449 Chronic obstructive pulmonary disease, unspecified: Secondary | ICD-10-CM | POA: Diagnosis not present

## 2015-07-12 DIAGNOSIS — M160 Bilateral primary osteoarthritis of hip: Secondary | ICD-10-CM | POA: Diagnosis not present

## 2015-07-12 DIAGNOSIS — E785 Hyperlipidemia, unspecified: Secondary | ICD-10-CM | POA: Insufficient documentation

## 2015-07-12 DIAGNOSIS — I208 Other forms of angina pectoris: Secondary | ICD-10-CM

## 2015-07-12 DIAGNOSIS — I10 Essential (primary) hypertension: Secondary | ICD-10-CM | POA: Insufficient documentation

## 2015-07-12 DIAGNOSIS — Z87891 Personal history of nicotine dependence: Secondary | ICD-10-CM | POA: Diagnosis not present

## 2015-07-12 DIAGNOSIS — I209 Angina pectoris, unspecified: Secondary | ICD-10-CM | POA: Diagnosis present

## 2015-07-12 DIAGNOSIS — R0600 Dyspnea, unspecified: Secondary | ICD-10-CM

## 2015-07-12 HISTORY — PX: CARDIAC CATHETERIZATION: SHX172

## 2015-07-12 SURGERY — LEFT HEART CATH AND CORONARY ANGIOGRAPHY

## 2015-07-12 MED ORDER — MIDAZOLAM HCL 2 MG/2ML IJ SOLN
INTRAMUSCULAR | Status: AC
  Filled 2015-07-12: qty 4

## 2015-07-12 MED ORDER — SODIUM CHLORIDE 0.9 % IV SOLN
INTRAVENOUS | Status: DC | PRN
  Administered 2015-07-12: 50 mL/h via INTRAVENOUS

## 2015-07-12 MED ORDER — HEPARIN SODIUM (PORCINE) 1000 UNIT/ML IJ SOLN
INTRAMUSCULAR | Status: AC
  Filled 2015-07-12: qty 1

## 2015-07-12 MED ORDER — IOHEXOL 350 MG/ML SOLN
INTRAVENOUS | Status: DC | PRN
  Administered 2015-07-12: 50 mL via INTRA_ARTERIAL

## 2015-07-12 MED ORDER — PREDNISONE 20 MG PO TABS: 60.0000 mg | ORAL_TABLET | ORAL | Status: DC

## 2015-07-12 MED ORDER — SODIUM CHLORIDE 0.9 % IJ SOLN: 3.0000 mL | INTRAMUSCULAR | Status: DC | PRN

## 2015-07-12 MED ORDER — SODIUM CHLORIDE 0.9 % WEIGHT BASED INFUSION: 3.0000 mL/kg/h | INTRAVENOUS | Status: DC

## 2015-07-12 MED ORDER — SODIUM CHLORIDE 0.9 % IV SOLN: 250.0000 mL | INTRAVENOUS | Status: DC | PRN

## 2015-07-12 MED ORDER — SODIUM CHLORIDE 0.9 % WEIGHT BASED INFUSION: 1.0000 mL/kg/h | INTRAVENOUS | Status: DC

## 2015-07-12 MED ORDER — SODIUM CHLORIDE 0.9 % WEIGHT BASED INFUSION
3.0000 mL/kg/h | INTRAVENOUS | Status: DC
  Administered 2015-07-12: 3 mL/kg/h via INTRAVENOUS

## 2015-07-12 MED ORDER — ACETAMINOPHEN 325 MG PO TABS: 650.0000 mg | ORAL_TABLET | Freq: Once | ORAL | Status: DC

## 2015-07-12 MED ORDER — HEPARIN (PORCINE) IN NACL 2-0.9 UNIT/ML-% IJ SOLN
INTRAMUSCULAR | Status: AC
  Filled 2015-07-12: qty 1500

## 2015-07-12 MED ORDER — MIDAZOLAM HCL 2 MG/2ML IJ SOLN
INTRAMUSCULAR | Status: DC | PRN
  Administered 2015-07-12: 1 mg via INTRAVENOUS
  Administered 2015-07-12: 2 mg via INTRAVENOUS

## 2015-07-12 MED ORDER — FAMOTIDINE 20 MG PO TABS: 20.0000 mg | ORAL_TABLET | ORAL | Status: DC

## 2015-07-12 MED ORDER — FENTANYL CITRATE (PF) 100 MCG/2ML IJ SOLN
INTRAMUSCULAR | Status: DC | PRN
  Administered 2015-07-12 (×2): 25 ug via INTRAVENOUS

## 2015-07-12 MED ORDER — VERAPAMIL HCL 2.5 MG/ML IV SOLN
INTRAVENOUS | Status: AC
  Filled 2015-07-12: qty 2

## 2015-07-12 MED ORDER — LIDOCAINE HCL (PF) 1 % IJ SOLN
INTRAMUSCULAR | Status: AC
  Filled 2015-07-12: qty 30

## 2015-07-12 MED ORDER — ACETAMINOPHEN 325 MG PO TABS
ORAL_TABLET | ORAL | Status: AC
  Filled 2015-07-12: qty 2

## 2015-07-12 MED ORDER — DIPHENHYDRAMINE HCL 50 MG/ML IJ SOLN: 25.0000 mg | INTRAMUSCULAR | Status: DC

## 2015-07-12 MED ORDER — HEPARIN SODIUM (PORCINE) 1000 UNIT/ML IJ SOLN
INTRAMUSCULAR | Status: DC | PRN
  Administered 2015-07-12: 3500 [IU] via INTRAVENOUS

## 2015-07-12 MED ORDER — SODIUM CHLORIDE 0.9 % IJ SOLN: 3.0000 mL | Freq: Two times a day (BID) | INTRAMUSCULAR | Status: DC

## 2015-07-12 MED ORDER — ACETAMINOPHEN 10 MG/ML IV SOLN
650.0000 mg | Freq: Four times a day (QID) | INTRAVENOUS | Status: DC
  Administered 2015-07-12: 650 mg via INTRAVENOUS

## 2015-07-12 MED ORDER — VERAPAMIL HCL 2.5 MG/ML IV SOLN
INTRAVENOUS | Status: DC | PRN
  Administered 2015-07-12: 09:00:00 via INTRA_ARTERIAL

## 2015-07-12 MED ORDER — LIDOCAINE HCL (PF) 1 % IJ SOLN
INTRAMUSCULAR | Status: DC | PRN
  Administered 2015-07-12: 09:00:00

## 2015-07-12 MED ORDER — FENTANYL CITRATE (PF) 100 MCG/2ML IJ SOLN
INTRAMUSCULAR | Status: AC
  Filled 2015-07-12: qty 4

## 2015-07-12 SURGICAL SUPPLY — 14 items
CATH INFINITI 5 FR JL3.5 (CATHETERS) ×3 IMPLANT
CATH INFINITI 5FR ANG PIGTAIL (CATHETERS) ×3 IMPLANT
CATH INFINITI JR4 5F (CATHETERS) ×3 IMPLANT
DEVICE RAD COMP TR BAND LRG (VASCULAR PRODUCTS) ×3 IMPLANT
GLIDESHEATH SLEND SS 6F .021 (SHEATH) ×3 IMPLANT
KIT HEART LEFT (KITS) ×3 IMPLANT
PACK CARDIAC CATHETERIZATION (CUSTOM PROCEDURE TRAY) ×3 IMPLANT
SYR MEDRAD MARK V 150ML (SYRINGE) ×3 IMPLANT
TRANSDUCER W/STOPCOCK (MISCELLANEOUS) ×3 IMPLANT
TUBING ART PRESS 72  MALE/FEM (TUBING) ×2
TUBING ART PRESS 72 MALE/FEM (TUBING) ×1 IMPLANT
TUBING CIL FLEX 10 FLL-RA (TUBING) ×3 IMPLANT
WIRE HI TORQ VERSACORE-J 145CM (WIRE) ×3 IMPLANT
WIRE SAFE-T 1.5MM-J .035X260CM (WIRE) ×3 IMPLANT

## 2015-07-12 NOTE — Interval H&P Note (Signed)
Cath Lab Visit (complete for each Cath Lab visit)  Clinical Evaluation Leading to the Procedure:   ACS: No.  Non-ACS:    Anginal Classification: CCS III  Anti-ischemic medical therapy: Minimal Therapy (1 class of medications)  Non-Invasive Test Results: No non-invasive testing performed  Prior CABG: No previous CABG      History and Physical Interval Note:  07/12/2015 8:00 AM  [name]  has presented today for surgery, with the diagnosis of cp  The various methods of treatment have been discussed with the patient and family. After consideration of risks, benefits and other options for treatment, the patient has consented to  Procedure(s): Left Heart Cath and Coronary Angiography (N/A) as a surgical intervention .  The patient's history has been reviewed, patient examined, no change in status, stable for surgery.  I have reviewed the patient's chart and labs.  Questions were answered to the patient's satisfaction.     Norrine Ballester S.

## 2015-07-12 NOTE — Discharge Instructions (Signed)
Radial Site Care °Refer to this sheet in the next few weeks. These instructions provide you with information about caring for yourself after your procedure. Your health care provider may also give you more specific instructions. Your treatment has been planned according to current medical practices, but problems sometimes occur. Call your health care provider if you have any problems or questions after your procedure. °WHAT TO EXPECT AFTER THE PROCEDURE °After your procedure, it is typical to have the following: °· Bruising at the radial site that usually fades within 1-2 weeks. °· Blood collecting in the tissue (hematoma) that may be painful to the touch. It should usually decrease in size and tenderness within 1-2 weeks. °HOME CARE INSTRUCTIONS °· Take medicines only as directed by your health care provider. °· You may shower 24-48 hours after the procedure or as directed by your health care provider. Remove the bandage (dressing) and gently wash the site with plain soap and water. Pat the area dry with a clean towel. Do not rub the site, because this may cause bleeding. °· Do not take baths, swim, or use a hot tub until your health care provider approves. °· Check your insertion site every day for redness, swelling, or drainage. °· Do not apply powder or lotion to the site. °· Do not flex or bend the affected arm for 24 hours or as directed by your health care provider. °· Do not push or pull heavy objects with the affected arm for 24 hours or as directed by your health care provider. °· Do not lift over 10 lb (4.5 kg) for 5 days after your procedure or as directed by your health care provider. °· Ask your health care provider when it is okay to: °¨ Return to work or school. °¨ Resume usual physical activities or sports. °¨ Resume sexual activity. °· Do not drive home if you are discharged the same day as the procedure. Have someone else drive you. °· You may drive 24 hours after the procedure unless otherwise  instructed by your health care provider. °· Do not operate machinery or power tools for 24 hours after the procedure. °· If your procedure was done as an outpatient procedure, which means that you went home the same day as your procedure, a responsible adult should be with you for the first 24 hours after you arrive home. °· Keep all follow-up visits as directed by your health care provider. This is important. °SEEK MEDICAL CARE IF: °· You have a fever. °· You have chills. °· You have increased bleeding from the radial site. Hold pressure on the site. °SEEK IMMEDIATE MEDICAL CARE IF: °· You have unusual pain at the radial site. °· You have redness, warmth, or swelling at the radial site. °· You have drainage (other than a small amount of blood on the dressing) from the radial site. °· The radial site is bleeding, hold steady pressure on the site and call 911. °· Your arm or hand becomes pale, cool, tingly, or numb. °  °This information is not intended to replace advice given to you by your health care provider. Make sure you discuss any questions you have with your health care provider. °  °Document Released: 09/26/2010 Document Revised: 09/14/2014 Document Reviewed: 03/12/2014 °Elsevier Interactive Patient Education ©2016 [employer] ° °

## 2015-07-12 NOTE — H&P (View-Only) (Signed)
HPI: 54 yo female for evaluation of chest pain. 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 Follow-up angiogram August 2016 showed persistent occlusion. Echocardiogram December 2015 showed vigorous LV function. Mild left ventricular hypertrophy. Patient states that for the past year she has had increasing dyspnea on exertion. There is no orthopnea, PND, pedal edema. She also has had occasions of sharp chest pain and pressure with activities relieved with rest. The pain is not pleuritic, positional or related to food. She noticed this more severely when [facility] [place] [facility] [place]. She has had no chest pain in the past 2-3 weeks.  Current Outpatient Prescriptions  Medication Sig Dispense Refill  . acetaminophen (TYLENOL) 500 MG tablet Take 1,000 mg by mouth every 6 (six) hours as needed for mild pain or moderate pain.    Marland Kitchen. atorvastatin (LIPITOR) 40 MG tablet Take 1 tablet (40 mg total) by mouth daily. Last cholesterol 2/16. Call office for lab order 90 tablet 0  . calcium carbonate (OS-CAL) 600 MG TABS tablet Take 1,200 mg by mouth daily.    . cholecalciferol (VITAMIN D) 1000 UNITS tablet Take 2,000 Units by mouth daily.    Marland Kitchen. losartan-hydrochlorothiazide (HYZAAR) 50-12.5 MG tablet Take 1 tablet by mouth daily. 30 tablet 1  . Multiple Vitamins-Minerals (MULTIVITAMIN ADULTS 50+) TABS Take 1 tablet by mouth daily.    . nitroGLYCERIN (NITROSTAT) 0.3 MG SL tablet Place 1 tablet (0.3 mg total) under the tongue every 5 (five) minutes as needed for chest pain. 90 tablet 12  . Omega-3 Fatty Acids (FISH OIL) 1200 MG CAPS Take 2,400 mg by mouth daily.    Marland Kitchen. omeprazole (PRILOSEC) 40 MG capsule Take 1 capsule (40 mg total) by mouth daily. 30 capsule 3  . OVER THE COUNTER MEDICATION Take 2 tablets by mouth daily. OTC Vitamin C 60 mg + Biotin 200    . pseudoephedrine-acetaminophen (TYLENOL SINUS) 30-500 MG TABS Take 1 tablet by mouth 2 (two)  times daily as needed (congestion).     No current facility-administered medications for this visit.    Allergies  Allergen Reactions  . Aspirin     HIves  . Ibuprofen     Hives  . Iodine   . Lobster [Shellfish Allergy]      Past Medical History  Diagnosis Date  . Hypertension   . Hyperlipidemia 09/05/2014  . Primary osteoarthritis of both hips 11/07/2014  . 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 Doristine SectionSubramanian. Given contacts and glasses. Follow up yearly.    . Non-traumatic intracranial subarachnoid hemorrhage (HCC)   . Former smoker 09/03/2014    Quit date 08/13/14   . Diplopia 09/05/2014    Resolved. Dr. Doristine SectionSubramanian.    . DDD (degenerative disc disease), lumbar 11/06/2014  . COPD (chronic obstructive pulmonary disease) (HCC) 09/05/2014    Per notes from ED.      Past Surgical History  Procedure Laterality Date  . Cesarean section N/A   . Radiology with anesthesia N/A 08/14/2014    Procedure: Embolization   (RADIOLOGY WITH ANESTHESIA) ;  Surgeon: Medication Radiologist, MD;  Location: [facility] OR;  Service: Radiology;  Laterality: N/A;    Social History   Social History  . Marital Status: Single    Spouse Name: N/A  . Number of Children: 2  . Years of Education: N/A   Occupational History  . Not on file.   Social History Main Topics  . Smoking status:  Former Smoker -- 1.00 packs/day  . Smokeless tobacco: Not on file  . Alcohol Use: 0.0 oz/week    0 Standard drinks or equivalent per week     Comment: 2/3 drinks per day  . Drug Use: No  . Sexual Activity: Yes   Other Topics Concern  . Not on file   Social History Narrative    Family History  Problem Relation Age of Onset  . Alcohol abuse Mother   . Breast cancer Mother   . Hyperlipidemia Mother   . Hypertension Mother   . Aneurysm Mother   . Alcohol abuse Father   . Stroke Father   . Cerebral aneurysm Father     RUPTURE  . Hyperlipidemia Sister   . Hypertension Sister   . Aneurysm Sister     . Aneurysm Sister     ROS: no fevers or chills, productive cough, hemoptysis, dysphasia, odynophagia, melena, hematochezia, dysuria, hematuria, rash, seizure activity, orthopnea, PND, pedal edema, claudication. Remaining systems are negative.  Physical Exam:   Blood pressure 129/78, pulse 80, height  (1.626 m), weight 64.864 kg (143 lb).  General:  Well developed/well nourished in NAD Skin warm/dry, Tattoos Patient not depressed No peripheral clubbing Back-normal HEENT-normal/normal eyelids Neck supple/normal carotid upstroke bilaterally; no bruits; no JVD; no thyromegaly chest - CTA/ normal expansion CV - RRR/normal S1 and S2; no murmurs, rubs or gallops;  PMI nondisplaced Abdomen -NT/ND, no HSM, no mass, + bowel sounds, no bruit 2+ femoral pulses, no bruits Ext-no edema, chords, 2+ DP Neuro-grossly nonfocal  ECG 07/02/2015-sinus rhythm, deep anterior lateral T-wave inversion, prolonged QT interval. Changes are new compared to 12/057/2015.

## 2015-07-12 NOTE — Progress Notes (Signed)
0749 administration of acetaminophen 650mg  as documented on MAR was given PO-- NOT IV as stated.

## 2015-08-25 ENCOUNTER — Other Ambulatory Visit: Payer: Self-pay | Admitting: Physician Assistant

## 2015-09-12 ENCOUNTER — Telehealth: Payer: Self-pay | Admitting: Family Medicine

## 2015-09-12 MED ORDER — OMEPRAZOLE 40 MG PO CPDR: 40.0000 mg | DELAYED_RELEASE_CAPSULE | Freq: Every day | ORAL | Status: DC

## 2015-09-12 NOTE — Telephone Encounter (Signed)
done

## 2015-09-12 NOTE — Telephone Encounter (Signed)
Pt called to request a refill on her Omeprazole. Explained this Rx had been discontinued at her last visit. Pt states she was changed to Prevacid because the Omeprazole interacted with her blood thinner. Pt is no longer on the blood thinner and would like to change back to Omeprazole. Will route to PCP.

## 2015-09-24 ENCOUNTER — Other Ambulatory Visit: Payer: Self-pay | Admitting: Physician Assistant

## 2015-10-08 ENCOUNTER — Other Ambulatory Visit: Payer: Self-pay | Admitting: Physician Assistant

## 2015-11-09 ENCOUNTER — Other Ambulatory Visit: Payer: Self-pay | Admitting: Physician Assistant

## 2015-11-11 ENCOUNTER — Encounter: Payer: Self-pay | Admitting: Family Medicine

## 2015-11-11 ENCOUNTER — Ambulatory Visit (INDEPENDENT_AMBULATORY_CARE_PROVIDER_SITE_OTHER): Payer: Managed Care, Other (non HMO) | Admitting: Family Medicine

## 2015-11-11 VITALS — BP 126/63 | HR 63

## 2015-11-11 DIAGNOSIS — Z1239 Encounter for other screening for malignant neoplasm of breast: Secondary | ICD-10-CM | POA: Diagnosis not present

## 2015-11-11 DIAGNOSIS — E785 Hyperlipidemia, unspecified: Secondary | ICD-10-CM

## 2015-11-11 DIAGNOSIS — I10 Essential (primary) hypertension: Secondary | ICD-10-CM

## 2015-11-11 DIAGNOSIS — I608 Other nontraumatic subarachnoid hemorrhage: Secondary | ICD-10-CM

## 2015-11-11 DIAGNOSIS — Z23 Encounter for immunization: Secondary | ICD-10-CM | POA: Diagnosis not present

## 2015-11-11 MED ORDER — LOSARTAN POTASSIUM-HCTZ 50-12.5 MG PO TABS: 1.0000 | ORAL_TABLET | Freq: Every day | ORAL | Status: DC

## 2015-11-11 NOTE — Assessment & Plan Note (Signed)
Patient has biometric screening at work soon. She'll bring by records. Continue Lipitor.

## 2015-11-11 NOTE — Assessment & Plan Note (Signed)
Continue losartan/hydrochlorothiazide. Patient will bring by biometric labs.

## 2015-11-11 NOTE — Assessment & Plan Note (Signed)
Doing well. Follow-up per neurosurgery.

## 2015-11-11 NOTE — Patient Instructions (Signed)
Thank you for coming in today. Bring biometric labs back.  Return in 6 months.  Get mammogram downstairs.

## 2015-11-11 NOTE — Progress Notes (Signed)
Gabrielle DeistSharon R Lorey is a 55 y.o. female who [facility] Primary Care today for a low of hypertension. Follow-up hypertension. Patient feels well with no chest pains palpitations or shortness of breath. She tolerates her medicine well.   Patient additionally has hyperlipidemia. This is well-controlled with Lipitor. No muscle aches.  Patient has a history of intracranial subarachnoid hemorrhage. This is true with coiling. She feels great and is improving every day. She has an appointment with neurosurgery at the end of the year.  She does not that she's due for Tdap vaccine Pap smear and mammogram. She notes she had colon cancer screening in 2011.   Past Medical History  Diagnosis Date  . Hypertension   . Hyperlipidemia 09/05/2014  . Primary osteoarthritis of both hips 11/07/2014  . 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 Doristine SectionSubramanian. Given contacts and glasses. Follow up yearly.    . Non-traumatic intracranial subarachnoid hemorrhage (HCC)   . Former smoker 09/03/2014    Quit date 08/13/14   . Diplopia 09/05/2014    Resolved. Dr. Doristine SectionSubramanian.    . DDD (degenerative disc disease), lumbar 11/06/2014  . COPD (chronic obstructive pulmonary disease) (HCC) 09/05/2014    Per notes from ED.     Past Surgical History  Procedure Laterality Date  . Cesarean section N/A   . Radiology with anesthesia N/A 08/14/2014    Procedure: Embolization   (RADIOLOGY WITH ANESTHESIA) ;  Surgeon: Medication Radiologist, MD;  Location: [facility] OR;  Service: Radiology;  Laterality: N/A;  . Cardiac catheterization N/A 07/12/2015    Procedure: Left Heart Cath and Coronary Angiography;  Surgeon: Corky CraftsJayadeep S Varanasi, MD;  [facility];  Service: Cardiovascular;  Laterality: N/A;   Social History  Substance Use Topics  . Smoking status: Former Smoker -- 1.00 packs/day  . Smokeless tobacco: Not on  file  . Alcohol Use: 0.0 oz/week    0 Standard drinks or equivalent per week     Comment: 2/3 drinks per day   family history includes Alcohol abuse in her father and mother; Aneurysm in her mother, sister, and sister; Breast cancer in her mother; Cerebral aneurysm in her father; Hyperlipidemia in her mother and sister; Hypertension in her mother and sister; Stroke in her father.  ROS as above Medications: Current Outpatient Prescriptions  Medication Sig Dispense Refill  . acetaminophen (TYLENOL) 500 MG tablet Take 1,000 mg by mouth every 6 (six) hours as needed (pain).     Marland Kitchen. atorvastatin (LIPITOR) 40 MG tablet Take 1 tablet (40 mg total) by mouth daily. Last cholesterol 2/16. Call office for lab order 90 tablet 0  . cholecalciferol (VITAMIN D) 1000 UNITS tablet Take 1,000 Units by mouth daily.     . clopidogrel (PLAVIX) 75 MG tablet Take 1 tablet (75 mg total) by mouth daily. 30 tablet 11  . losartan-hydrochlorothiazide (HYZAAR) 50-12.5 MG tablet Take 1 tablet by mouth daily. 90 tablet 2  . Multiple Vitamin (MULTIVITAMIN WITH MINERALS) TABS tablet Take 1 tablet by mouth daily. Centrum Silver    . Multiple Vitamins-Minerals (HAIR/SKIN/NAILS/BIOTIN) TABS Take 2 tablets by mouth daily.    . nitroGLYCERIN (NITROSTAT) 0.3 MG SL tablet Place 1 tablet (0.3 mg total) under the tongue every 5 (five) minutes as needed for chest pain. 90 tablet 12  . Omega-3 Fatty Acids (FISH OIL) 1200 MG CAPS Take 1,200 mg by mouth daily.     . pseudoephedrine-acetaminophen (TYLENOL SINUS) 30-500 MG  TABS Take 2 tablets by mouth 2 (two) times daily as needed (congestion).      No current facility-administered medications for this visit.   Allergies  Allergen Reactions  . Aspirin Hives  . Ibuprofen Hives  . Iodine Hives  . Oysters [Shellfish Allergy] Hives and Nausea And Vomiting    Reaction to oysters, clams and crab legs     Exam:  BP 126/63 mmHg  Pulse 63 Gen: Well NAD HEENT: EOMI,  MMM Lungs: Normal  work of breathing. CTABL Heart: RRR no MRG Abd: NABS, Soft. Nondistended, Nontender Exts: Brisk capillary refill, warm and well perfused.   TDAP given prior to discharge  No results found for this or any previous visit (from the past 24 hour(s)). No results found.   Please see individual assessment and plan sections.  Mammogram ordered

## 2015-11-12 ENCOUNTER — Telehealth: Payer: Self-pay | Admitting: Family Medicine

## 2015-11-12 NOTE — Telephone Encounter (Signed)
Called pt and left message letting her know she needs to call and schedule a f/u appt with her pcp for BP and med refills

## 2015-11-12 NOTE — Telephone Encounter (Signed)
Called and left a detailed VM informing the pt that her 11/11/15 office visit addressed her hypertension and that she could disregard the VM to follow up. Also informed her that Dr.Corey sent refills to her pharmacy.

## 2015-11-21 LAB — LIPID PANEL
CHOLESTEROL: 157 mg/dL (ref 0–200)
HDL: 70 mg/dL (ref 35–70)
LDL CALC: 16 mg/dL
LDL/HDL RATIO: 0.2
TRIGLYCERIDES: 369 mg/dL — AB (ref 40–160)

## 2015-11-21 LAB — BASIC METABOLIC PANEL: Glucose: 88 mg/dL

## 2015-12-16 ENCOUNTER — Other Ambulatory Visit: Payer: Self-pay | Admitting: Physician Assistant

## 2016-03-12 ENCOUNTER — Encounter: Payer: Self-pay | Admitting: Osteopathic Medicine

## 2016-03-12 ENCOUNTER — Ambulatory Visit (INDEPENDENT_AMBULATORY_CARE_PROVIDER_SITE_OTHER): Payer: Managed Care, Other (non HMO) | Admitting: Osteopathic Medicine

## 2016-03-12 VITALS — BP 150/80 | HR 100 | Ht 63.0 in | Wt 130.0 lb

## 2016-03-12 DIAGNOSIS — K219 Gastro-esophageal reflux disease without esophagitis: Secondary | ICD-10-CM | POA: Diagnosis not present

## 2016-03-12 DIAGNOSIS — E785 Hyperlipidemia, unspecified: Secondary | ICD-10-CM | POA: Diagnosis not present

## 2016-03-12 DIAGNOSIS — I10 Essential (primary) hypertension: Secondary | ICD-10-CM | POA: Diagnosis not present

## 2016-03-12 MED ORDER — ATORVASTATIN CALCIUM 40 MG PO TABS: 40.0000 mg | ORAL_TABLET | Freq: Every day | ORAL | Status: DC

## 2016-03-12 MED ORDER — OMEPRAZOLE 20 MG PO CPDR: 20.0000 mg | DELAYED_RELEASE_CAPSULE | Freq: Every day | ORAL | Status: DC

## 2016-03-12 NOTE — Progress Notes (Signed)
HPI: [name] is a 55 y.o. female who [facility] [place]  today for chief complaint of:  Chief Complaint  Patient presents with  . Nausea  . Diarrhea   Patient concerned initially about nausea and diarrhea however these symptoms seem to have resolved by the time she is made her appointment. She kept her appointment today to inquire about 90 day refills for some prescriptions.  Hypertension: Blood pressure mildly elevated today, she says that she has not been taking her antihypertensive medicine due to recent nausea, no chest pain, pressure, shortness of breath. Blood pressure within normal range at last visit.   Gastro Esophageal reflux disease: Patient would like refill of omeprazole, I don't see this on her medication list. She says that she has been on this for over a year, has tried Zantac as alternative in the past but was not able to control symptoms with this medicine.  Elevated cholesterol: Patient requests 90 day refill on Lipitor   Past medical, social and family history reviewed. Current medications and allergies reviewed.     Review of Systems: CONSTITUTIONAL: no fever/chills HEAD/EYES/EARS/NOSE/THROAT: no headache, no vision change or hearing change, no sore throat CARDIAC: No chest pain, No pressure/palpitations RESPIRATORY: no cough, no shortness of breath GASTROINTESTINAL: no nausea, no vomiting, no abdominal pain, no diarrhea MUSCULOSKELETAL: no myalgia/arthralgia SKIN: no rash   Exam:  BP 150/80 mmHg  Pulse 100  Ht 5\' 3"  (1.6 m)  Wt 130 lb (58.968 kg)  BMI 23.03 kg/m2 Constitutional: VSS, see above. General Appearance: alert, well-developed, well-nourished, NAD Eyes: Normal lids and conjunctive, non-icteric sclera Ears, Nose, Mouth, Throat: Normal external inspection ears/nares/mouth/lips/gums, MMM Neck: No masses, trachea midline.  Respiratory: Normal respiratory effort. No   wheeze/rhonchi/rales Cardiovascular: S1/S2 normal, no murmur/rub/gallop auscultated. RRR.   Labs reviewed: Normal CBC and CMP 8 months ago, patient states that she has biometric screening results in her car today and will bring these in after her visit for us to forward to Dr. Denyse Amassorey for review  ASSESSMENT/PLAN:   Gastroesophageal reflux disease without esophagitis - Advise risks of long-term PPI use, will reduce dose today, consider follow-up with GI for EGD, unable to test for H pylori due to on PPI - Plan: omeprazole (PRILOSEC) 20 MG capsule  Hyperlipidemia - 90 day refills given for Lipitor - Plan: atorvastatin (LIPITOR) 40 MG tablet  Essential hypertension, benign - Advised patient needs to be taking medication consistently even if feeling ill    Visit summary was printed for the patient with medications and pertinent instructions for patient to review. ER/RTC precautions reviewed. All questions answered. Return in about 3 months (around 06/12/2016) for Recheck blood pressure, follow-up with Dr. Denyse Amassorey as directed.

## 2016-04-23 ENCOUNTER — Encounter: Payer: Self-pay | Admitting: Family Medicine

## 2016-04-30 ENCOUNTER — Encounter: Payer: Self-pay | Admitting: Family Medicine

## 2016-07-16 ENCOUNTER — Other Ambulatory Visit: Payer: Self-pay | Admitting: Neurosurgery

## 2016-07-16 DIAGNOSIS — I609 Nontraumatic subarachnoid hemorrhage, unspecified: Secondary | ICD-10-CM

## 2016-09-14 ENCOUNTER — Other Ambulatory Visit: Payer: Self-pay | Admitting: Neurosurgery

## 2016-10-16 ENCOUNTER — Other Ambulatory Visit: Payer: Self-pay | Admitting: Neurosurgery

## 2016-10-16 ENCOUNTER — Ambulatory Visit (HOSPITAL_COMMUNITY)
Admission: RE | Admit: 2016-10-16 | Discharge: 2016-10-16 | Disposition: A | Discharge status: Home / Self Care | Payer: Commercial Managed Care - PPO | Source: Ambulatory Visit | Attending: Neurosurgery | Admitting: Neurosurgery

## 2016-10-16 ENCOUNTER — Encounter (HOSPITAL_COMMUNITY): Payer: Self-pay

## 2016-10-16 DIAGNOSIS — M160 Bilateral primary osteoarthritis of hip: Secondary | ICD-10-CM | POA: Diagnosis not present

## 2016-10-16 DIAGNOSIS — I10 Essential (primary) hypertension: Secondary | ICD-10-CM | POA: Diagnosis not present

## 2016-10-16 DIAGNOSIS — Z48812 Encounter for surgical aftercare following surgery on the circulatory system: Secondary | ICD-10-CM | POA: Insufficient documentation

## 2016-10-16 DIAGNOSIS — Z87891 Personal history of nicotine dependence: Secondary | ICD-10-CM | POA: Insufficient documentation

## 2016-10-16 DIAGNOSIS — M5136 Other intervertebral disc degeneration, lumbar region: Secondary | ICD-10-CM | POA: Insufficient documentation

## 2016-10-16 DIAGNOSIS — Z883 Allergy status to other anti-infective agents status: Secondary | ICD-10-CM | POA: Insufficient documentation

## 2016-10-16 DIAGNOSIS — I609 Nontraumatic subarachnoid hemorrhage, unspecified: Secondary | ICD-10-CM

## 2016-10-16 DIAGNOSIS — E785 Hyperlipidemia, unspecified: Secondary | ICD-10-CM | POA: Insufficient documentation

## 2016-10-16 DIAGNOSIS — J449 Chronic obstructive pulmonary disease, unspecified: Secondary | ICD-10-CM | POA: Diagnosis not present

## 2016-10-16 DIAGNOSIS — Z8679 Personal history of other diseases of the circulatory system: Secondary | ICD-10-CM | POA: Insufficient documentation

## 2016-10-16 DIAGNOSIS — Z91013 Allergy to seafood: Secondary | ICD-10-CM | POA: Insufficient documentation

## 2016-10-16 HISTORY — PX: IR GENERIC HISTORICAL: IMG1180011

## 2016-10-16 LAB — BASIC METABOLIC PANEL
Anion gap: 11 (ref 5–15)
BUN: 8 mg/dL (ref 6–20)
CO2: 32 mmol/L (ref 22–32)
CREATININE: 1.12 mg/dL — AB (ref 0.44–1.00)
Calcium: 9.7 mg/dL (ref 8.9–10.3)
Chloride: 100 mmol/L — ABNORMAL LOW (ref 101–111)
GFR calc Af Amer: >60 mL/min (ref >60)
GFR, EST NON AFRICAN AMERICAN: 53 mL/min — AB (ref >60)
GLUCOSE: 88 mg/dL (ref 65–99)
Potassium: 3.1 mmol/L — ABNORMAL LOW (ref 3.5–5.1)
Sodium: 143 mmol/L (ref 135–145)

## 2016-10-16 LAB — URINALYSIS, ROUTINE W REFLEX MICROSCOPIC
Bacteria, UA: NONE SEEN
Bilirubin Urine: NEGATIVE
GLUCOSE, UA: NEGATIVE mg/dL
Hgb urine dipstick: NEGATIVE
Ketones, ur: NEGATIVE mg/dL
Nitrite: NEGATIVE
PROTEIN: NEGATIVE mg/dL
SPECIFIC GRAVITY, URINE: 1.005 (ref 1.005–1.030)
pH: 8 (ref 5.0–8.0)

## 2016-10-16 LAB — CBC WITH DIFFERENTIAL/PLATELET
BASOS PCT: 0 %
Basophils Absolute: 0 10*3/uL (ref 0.0–0.1)
EOS ABS: 0.1 10*3/uL (ref 0.0–0.7)
EOS PCT: 2 %
HEMATOCRIT: 35.3 % — AB (ref 36.0–46.0)
Hemoglobin: 12.1 g/dL (ref 12.0–15.0)
Lymphocytes Relative: 34 %
Lymphs Abs: 2.1 10*3/uL (ref 0.7–4.0)
MCH: 34.8 pg — ABNORMAL HIGH (ref 26.0–34.0)
MCHC: 34.3 g/dL (ref 30.0–36.0)
MCV: 101.4 fL — ABNORMAL HIGH (ref 78.0–100.0)
MONO ABS: 0.3 10*3/uL (ref 0.1–1.0)
MONOS PCT: 5 %
Neutro Abs: 3.7 10*3/uL (ref 1.7–7.7)
Neutrophils Relative %: 59 %
PLATELETS: 324 10*3/uL (ref 150–400)
RBC: 3.48 MIL/uL — ABNORMAL LOW (ref 3.87–5.11)
RDW: 12.9 % (ref 11.5–15.5)
WBC: 6.3 10*3/uL (ref 4.0–10.5)

## 2016-10-16 LAB — PROTIME-INR
INR: 0.97
PROTHROMBIN TIME: 12.9 s (ref 11.4–15.2)

## 2016-10-16 LAB — APTT: APTT: 25 s (ref 24–36)

## 2016-10-16 MED ORDER — IOPAMIDOL (ISOVUE-300) INJECTION 61%
INTRAVENOUS | Status: AC
  Administered 2016-10-16: 35 mL
  Filled 2016-10-16: qty 100

## 2016-10-16 MED ORDER — HEPARIN SODIUM (PORCINE) 1000 UNIT/ML IJ SOLN
INTRAMUSCULAR | Status: AC | PRN
  Administered 2016-10-16: 2000 [IU] via INTRAVENOUS

## 2016-10-16 MED ORDER — MIDAZOLAM HCL 2 MG/2ML IJ SOLN
INTRAMUSCULAR | Status: AC | PRN
  Administered 2016-10-16: 1 mg via INTRAVENOUS

## 2016-10-16 MED ORDER — SODIUM CHLORIDE 0.9 % IV SOLN
INTRAVENOUS | Status: AC | PRN
Start: 2016-10-16 — End: 2016-10-16
  Administered 2016-10-16: 10 mL/h via INTRAVENOUS

## 2016-10-16 MED ORDER — FENTANYL CITRATE (PF) 100 MCG/2ML IJ SOLN
INTRAMUSCULAR | Status: AC | PRN
  Administered 2016-10-16: 25 ug via INTRAVENOUS

## 2016-10-16 MED ORDER — CHLORHEXIDINE GLUCONATE CLOTH 2 % EX PADS: 6.0000 | MEDICATED_PAD | Freq: Once | CUTANEOUS | Status: DC

## 2016-10-16 MED ORDER — LIDOCAINE HCL 1 % IJ SOLN
INTRAMUSCULAR | Status: AC
  Filled 2016-10-16: qty 20

## 2016-10-16 MED ORDER — HYDROCODONE-ACETAMINOPHEN 5-325 MG PO TABS: 1.0000 | ORAL_TABLET | ORAL | Status: DC | PRN

## 2016-10-16 MED ORDER — HEPARIN SODIUM (PORCINE) 1000 UNIT/ML IJ SOLN
INTRAMUSCULAR | Status: AC
  Filled 2016-10-16: qty 2

## 2016-10-16 MED ORDER — MIDAZOLAM HCL 2 MG/2ML IJ SOLN
INTRAMUSCULAR | Status: AC
  Filled 2016-10-16: qty 2

## 2016-10-16 MED ORDER — LIDOCAINE HCL (PF) 1 % IJ SOLN
INTRAMUSCULAR | Status: AC | PRN
  Administered 2016-10-16: 10 mL

## 2016-10-16 MED ORDER — SODIUM CHLORIDE 0.9 % IV SOLN: INTRAVENOUS | Status: DC

## 2016-10-16 MED ORDER — FENTANYL CITRATE (PF) 100 MCG/2ML IJ SOLN
INTRAMUSCULAR | Status: AC
  Filled 2016-10-16: qty 2

## 2016-10-16 NOTE — Discharge Instructions (Signed)
Cerebral Angiogram, Care After °Refer to this sheet in the next few weeks. These instructions provide you with information on caring for yourself after your procedure. Your health care provider may also give you more specific instructions. Your treatment has been planned according to current medical practices, but problems sometimes occur. Call your health care provider if you have any problems or questions after your procedure. °What can I expect after the procedure? °After your procedure, it is typical to have the following: °· Bruising at the catheter insertion site that usually fades within 1-2 weeks. °· Blood collecting in the tissue (hematoma) that may be painful to the touch. It should usually decrease in size and tenderness within 1-2 weeks. °· A mild headache. ° °Follow these instructions at home: °· Take medicines only as directed by your health care provider. °· You may shower 24-48 hours after the procedure or as directed by your health care provider. Remove the bandage (dressing) and gently wash the site with plain soap and water. Pat the area dry with a clean towel. Do not rub the site, because this may cause bleeding. °· Do not take baths, swim, or use a hot tub until your health care provider approves. °· Check your insertion site every day for redness, swelling, or drainage. °· Do not apply powder or lotion to the site. °· Do not lift over 10 lb (4.5 kg) for 5 days after your procedure or as directed by your health care provider. °· Ask your health care provider when it is okay to: °? Return to work or school. °? Resume usual physical activities or sports. °? Resume sexual activity. °· Do not drive home if you are discharged the same day as the procedure. Have someone else drive you. °· You may drive 24 hours after the procedure unless otherwise instructed by your health care provider. °· Do not operate machinery or power tools for 24 hours after the procedure or as directed by your health care  provider. °· If your procedure was done as an outpatient procedure, which means that you went home the same day as your procedure, a responsible adult should be with you for the first 24 hours after you arrive home. °· Keep all follow-up visits as directed by your health care provider. This is important. °Contact a health care provider if: °· You have a fever. °· You have chills. °· You have increased bleeding from the catheter insertion site. Hold pressure on the site. °Get help right away if: °· You have vision changes or loss of vision. °· You have numbness or weakness on one side of your body. °· You have difficulty talking, or you have slurred speech or cannot speak (aphasia). °· You feel confused or have difficulty remembering. °· You have unusual pain at the catheter insertion site. °· You have redness, warmth, or swelling at the catheter insertion site. °· You have drainage (other than a small amount of blood on the dressing) from the catheter insertion site. °· The catheter insertion site is bleeding, and the bleeding does not stop after 30 minutes of holding steady pressure on the site. °These symptoms may represent a serious problem that is an emergency. Do not wait to see if the symptoms will go away. Get medical help right away. Call your local emergency services (911 in [place] Do not drive yourself to the hospital. °This information is not intended to replace advice given to you by your health care provider. Make sure you discuss any questions   you have with your health care provider. °Document Released: 01/08/2014 Document Revised: 01/30/2016 Document Reviewed: 09/06/2013 °Elsevier Interactive Patient Education © 2017 [employer] ° °

## 2016-10-16 NOTE — Sedation Documentation (Signed)
5 Fr. Exoseal to right groin 

## 2016-10-16 NOTE — H&P (Signed)
CC:  Aneurysm rupture  HPI: [name] is a 56 y.o. 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 She presents for routine long-term angiographic f/u.  PMH: Past Medical History:  Diagnosis Date  . COPD (chronic obstructive pulmonary disease) (HCC) 09/05/2014   Per notes from ED.    . DDD (degenerative disc disease), lumbar 11/06/2014  . Diplopia 09/05/2014   Resolved. Dr. Doristine SectionSubramanian.    . Former smoker 09/03/2014   Quit date 08/13/14   . Hyperlipidemia 09/05/2014  . Hypertension   . Non-traumatic intracranial subarachnoid hemorrhage (HCC)   . 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 Doristine SectionSubramanian. Given contacts and glasses. Follow up yearly.    . Primary osteoarthritis of both hips 11/07/2014    PSH: Past Surgical History:  Procedure Laterality Date  . CARDIAC CATHETERIZATION N/A 07/12/2015   Procedure: Left Heart Cath and Coronary Angiography;  Surgeon: Corky CraftsJayadeep S Varanasi, MD;  [facility];  Service: Cardiovascular;  Laterality: N/A;  . CESAREAN SECTION N/A   . RADIOLOGY WITH ANESTHESIA N/A 08/14/2014   Procedure: Embolization   (RADIOLOGY WITH ANESTHESIA) ;  Surgeon: Medication Radiologist, MD;  Location: [facility] OR;  Service: Radiology;  Laterality: N/A;    SH: Social History  Substance Use Topics  . Smoking status: Former Smoker    Packs/day: 1.00  . Smokeless tobacco: Not on file  . Alcohol use 0.0 oz/week     Comment: 2/3 drinks per day    MEDS: Prior to Admission medications   Medication Sig Start Date End Date Taking? Authorizing Provider  acetaminophen (TYLENOL) 500 MG tablet Take 1,000 mg by mouth every 6 (six) hours as needed for mild pain (pain).    Yes Historical Provider, MD  atorvastatin (LIPITOR) 40 MG tablet Take 1 tablet (40 mg total) by mouth daily. 03/12/16  Yes Sunnie NielsenNatalie Alexander, DO  Cholecalciferol (VITAMIN D3) 2000 units TABS Take 2,000 Units by  mouth daily.   Yes Historical Provider, MD  DiphenhydrAMINE HCl (BENADRYL PO) Take 1 tablet by mouth at bedtime as needed (SLEEP).   Yes Historical Provider, MD  losartan-hydrochlorothiazide (HYZAAR) 50-12.5 MG tablet Take 1 tablet by mouth daily. 11/11/15  Yes Rodolph BongEvan S Corey, MD  Multiple Vitamin (MULTIVITAMIN WITH MINERALS) TABS tablet Take 1 tablet by mouth daily. Centrum Silver   Yes Historical Provider, MD  omeprazole (PRILOSEC) 20 MG capsule Take 1 capsule (20 mg total) by mouth daily. 03/12/16  Yes Sunnie NielsenNatalie Alexander, DO  nitroGLYCERIN (NITROSTAT) 0.3 MG SL tablet Place 1 tablet (0.3 mg total) under the tongue every 5 (five) minutes as needed for chest pain. 07/02/15   Rodolph BongEvan S Corey, MD    ALLERGY: Allergies  Allergen Reactions  . Aspirin Hives  . Ibuprofen Hives  . Iodine Hives  . Oysters [Shellfish Allergy] Hives and Nausea And Vomiting    Reaction to oysters, clams and crab legs    ROS: ROS  NEUROLOGIC EXAM: Awake, alert, oriented Memory and concentration grossly intact Speech fluent, appropriate CN grossly intact Motor exam: Upper Extremities Deltoid Bicep Tricep Grip  Right 5/5 5/5 5/5 5/5  Left 5/5 5/5 5/5 5/5   Lower Extremity IP Quad PF DF EHL  Right 5/5 5/5 5/5 5/5 5/5  Left 5/5 5/5 5/5 5/5 5/5   Sensation grossly intact to LT  IMPRESSION: - 56 y.o. female s/p coiling of right PICA aneurysm 2 yrs ago. Interval 1 yr angio was negative  for recurrence.  PLAN: - Proceed with angiogram for long-term f/u  I have previously reviewed the risks, benefits, alternatives with the patient. All questions were answered.

## 2016-10-20 ENCOUNTER — Other Ambulatory Visit: Payer: Self-pay | Admitting: Family Medicine

## 2016-10-20 ENCOUNTER — Other Ambulatory Visit: Payer: Self-pay | Admitting: Osteopathic Medicine

## 2016-10-20 DIAGNOSIS — K219 Gastro-esophageal reflux disease without esophagitis: Secondary | ICD-10-CM

## 2016-12-02 ENCOUNTER — Encounter: Payer: Self-pay | Admitting: Family Medicine

## 2016-12-02 ENCOUNTER — Ambulatory Visit (INDEPENDENT_AMBULATORY_CARE_PROVIDER_SITE_OTHER): Payer: Commercial Managed Care - PPO | Admitting: Family Medicine

## 2016-12-02 VITALS — BP 143/72 | HR 77 | Wt 141.0 lb

## 2016-12-02 DIAGNOSIS — Z1231 Encounter for screening mammogram for malignant neoplasm of breast: Secondary | ICD-10-CM

## 2016-12-02 DIAGNOSIS — R718 Other abnormality of red blood cells: Secondary | ICD-10-CM | POA: Diagnosis not present

## 2016-12-02 DIAGNOSIS — Z124 Encounter for screening for malignant neoplasm of cervix: Secondary | ICD-10-CM

## 2016-12-02 DIAGNOSIS — E782 Mixed hyperlipidemia: Secondary | ICD-10-CM

## 2016-12-02 DIAGNOSIS — I10 Essential (primary) hypertension: Secondary | ICD-10-CM

## 2016-12-02 DIAGNOSIS — E876 Hypokalemia: Secondary | ICD-10-CM | POA: Insufficient documentation

## 2016-12-02 DIAGNOSIS — Z1159 Encounter for screening for other viral diseases: Secondary | ICD-10-CM

## 2016-12-02 DIAGNOSIS — Z1239 Encounter for other screening for malignant neoplasm of breast: Secondary | ICD-10-CM

## 2016-12-02 MED ORDER — LOSARTAN POTASSIUM-HCTZ 50-12.5 MG PO TABS: 1.0000 | ORAL_TABLET | Freq: Every day | ORAL | 0 refills | Status: DC

## 2016-12-02 MED ORDER — OMEPRAZOLE 40 MG PO CPDR: 40.0000 mg | DELAYED_RELEASE_CAPSULE | Freq: Every day | ORAL | 3 refills | Status: DC

## 2016-12-02 NOTE — Progress Notes (Signed)
[name] is a 56 y.o. female who presents to [name]: [facility] today for hypertension. Patient returns to clinic today to follow-up hypertension hyperlipidemia and acid reflux.  Hypertension: Patient takes losartan/hydrochlorothiazide 50/12.5 mg daily. She denies chest pain palpitations shortness of breath. She notes typically her blood pressure is well controlled in the 120s or 1:30 systolic range.  Hyperlipidemia: Patient takes atorvastatin daily with no abdominal pain or muscle aches or pain.  Acid reflux: Patient takes omeprazole. Previously she was on 40 mg of controlled release omeprazole and did extremely well. She notes she was switched to 20 mg [name] notes worsening acid reflux symptoms. She has obnoxious as reflux despite taking omeprazole as well as Zantac area and she would like to resume the 40 mg form.  Low potassium: Patient had labs in preparation for radiological study showing potassium of 3.1. She denies muscle aches or tremors.  Elevated MCV. Patient has had a history of elevated MCV intermittently previously. She denies any history of liver disease.  Health maintenance: Patient is due for mammography as well as for Pap smear.   Past Medical History:  Diagnosis Date  . COPD (chronic obstructive pulmonary disease) (HCC) 09/05/2014   Per notes from ED.    . DDD (degenerative disc disease), lumbar 11/06/2014  . Diplopia 09/05/2014   Resolved. Dr. Doristine Section.    . Former smoker 09/03/2014   Quit date 08/13/14   . Hyperlipidemia 09/05/2014  . Hypertension   . Non-traumatic intracranial subarachnoid hemorrhage (HCC)   . 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 Doristine Section. Given contacts and glasses. Follow up yearly.    . Primary osteoarthritis of both hips 11/07/2014   Past Surgical History:  Procedure Laterality Date  . CARDIAC  CATHETERIZATION N/A 07/12/2015   Procedure: Left Heart Cath and Coronary Angiography;  Surgeon: Corky Crafts, MD;  Location: [facility] INVASIVE CV LAB;  Service: Cardiovascular;  Laterality: N/A;  . CESAREAN SECTION N/A   . IR GENERIC HISTORICAL  10/16/2016   IR ANGIO INTRA EXTRACRAN SEL INTERNAL CAROTID BILAT MOD SED 10/16/2016 [name], MD [facility]-INTER[facility]  . IR GENERIC HISTORICAL  10/16/2016   IR ANGIO VERTEBRAL SEL VERTEBRAL BILAT MOD SED 10/16/2016 [name], MD [facility]  . RADIOLOGY WITH ANESTHESIA N/A 08/14/2014   Procedure: Embolization   (RADIOLOGY WITH ANESTHESIA) ;  Surgeon: Medication Radiologist, MD;  Location: [facility] OR;  Service: Radiology;  Laterality: N/A;   Social History  Substance Use Topics  . Smoking status: Former Smoker    Packs/day: 1.00  . Smokeless tobacco: Never Used  . Alcohol use 0.0 oz/week     Comment: 2/3 drinks per day   family history includes Alcohol abuse in her father and mother; Aneurysm in her mother, sister, and sister; Breast cancer in her mother; Cerebral aneurysm in her father; Hyperlipidemia in her mother and sister; Hypertension in her mother and sister; Stroke in her father.  ROS as above:  Medications: Current Outpatient Prescriptions  Medication Sig Dispense Refill  . acetaminophen (TYLENOL) 500 MG tablet Take 1,000 mg by mouth every 6 (six) hours as needed for mild pain (pain).     Marland Kitchen atorvastatin (LIPITOR) 40 MG tablet Take 1 tablet (40 mg total) by mouth daily. 90 tablet 1  . Cholecalciferol (VITAMIN D3) 2000 units TABS Take 2,000 Units by mouth daily.    . DiphenhydrAMINE HCl (BENADRYL PO) Take 1 tablet by mouth at bedtime as needed (  SLEEP).    Marland Kitchen. losartan-hydrochlorothiazide (HYZAAR) 50-12.5 MG tablet Take 1 tablet by mouth daily. 90 tablet 0  . Multiple Vitamin (MULTIVITAMIN WITH MINERALS) TABS tablet Take 1 tablet by mouth daily. Centrum Silver    . nitroGLYCERIN (NITROSTAT) 0.3 MG SL tablet Place 1 tablet (0.3 mg total) under  the tongue every 5 (five) minutes as needed for chest pain. 90 tablet 12  . omeprazole (PRILOSEC) 40 MG capsule Take 1 capsule (40 mg total) by mouth daily. 90 capsule 3   No current facility-administered medications for this visit.    Allergies  Allergen Reactions  . Aspirin Hives  . Ibuprofen Hives  . Iodine Hives  . Oysters [Shellfish Allergy] Hives and Nausea And Vomiting    Reaction to oysters, clams and crab legs    Health Maintenance Health Maintenance  Topic Date Due  . Hepatitis C Screening  1961/07/26  . HIV Screening  05/31/1974  . PAP SMEAR  05/31/1980  . MAMMOGRAM  04/19/2011  . INFLUENZA VACCINE  04/07/2016  . COLONOSCOPY  09/08/2019  . TETANUS/TDAP  11/10/2025     Exam:  BP (!) 143/72   Pulse 77   Wt 141 lb (64 kg)   BMI 24.98 kg/m  Gen: Well NAD HEENT: EOMI,  MMM Lungs: Normal work of breathing. CTABL Heart: RRR no MRG Abd: NABS, Soft. Nondistended, Nontender Exts: Brisk capillary refill, warm and well perfused.    No results found for this or any previous visit (from the past 72 hour(s)). No results found.    Assessment and Plan: 56 y.o. female with  Hypertension: Blood pressures a bit elevated today. Patient will return in one month for a nurse visit for recheck blood pressure. She'll bring appropriate pressure log and her blood pressure cuff.  Hyperlipidemia: We'll check CMP today and patient will have cholesterol checked at work in about a month.  Acid reflux: Switch to omeprazole 40 mg controlled release daily.  Elevated MCV. Check B-12 as well as other basic labs including folate.  Low potassium: Check metabolic panel.  Screening: 3 mammogram ordered. Patient will schedule with OB/GYN for Pap smear per pt preference.     Orders Placed This Encounter  Procedures  . MM SCREENING BREAST TOMO BILATERAL    Standing Status:   Future    Standing Expiration Date:   02/01/2018    Order Specific Question:   Reason for Exam (SYMPTOM  OR  DIAGNOSIS REQUIRED)    Answer:   screen breast cancer    Order Specific Question:   Is the patient pregnant?    Answer:   No    Order Specific Question:   Preferred imaging location?    [facility]  . CBC  . COMPLETE METABOLIC PANEL WITH GFR  . Hepatitis C antibody  . VITAMIN D 25 Hydroxy (Vit-D Deficiency, Fractures)  . Vitamin B12  . Folate  . Ambulatory referral to Obstetrics / Gynecology    Referral Priority:   Routine    Referral Type:   Consultation    Referral Reason:   Specialty Services Required    Requested Specialty:   Obstetrics and Gynecology    Number of Visits Requested:   1   Meds ordered this encounter  Medications  . omeprazole (PRILOSEC) 40 MG capsule    Sig: Take 1 capsule (40 mg total) by mouth daily.    Dispense:  90 capsule    Refill:  3  . losartan-hydrochlorothiazide (HYZAAR) 50-12.5 MG tablet  Sig: Take 1 tablet by mouth daily.    Dispense:  90 tablet    Refill:  0     Discussed warning signs or symptoms. Please see discharge instructions. Patient expresses understanding.

## 2016-12-02 NOTE — Patient Instructions (Signed)
Thank you for coming in today. Get labs today.  Keep a home blood pressure log for 1 month.  Return in about 1 month with a nurse visit with the cuff for recheck.  I will get results to you ASAP.   Please bring the biometric screening when available.     Hypertension Hypertension is another name for high blood pressure. High blood pressure forces your heart to work harder to pump blood. This can cause problems over time. There are two numbers in a blood pressure reading. There is a top number (systolic) over a bottom number (diastolic). It is best to have a blood pressure below 120/80. Healthy choices can help lower your blood pressure. You may need medicine to help lower your blood pressure if:  Your blood pressure cannot be lowered with healthy choices.  Your blood pressure is higher than 130/80. Follow these instructions at home: Eating and drinking   If directed, follow the DASH eating plan. This diet includes:  Filling half of your plate at each meal with fruits and vegetables.  Filling one quarter of your plate at each meal with whole grains. Whole grains include whole wheat pasta, brown rice, and whole grain bread.  Eating or drinking low-fat dairy products, such as skim milk or low-fat yogurt.  Filling one quarter of your plate at each meal with low-fat (lean) proteins. Low-fat proteins include fish, skinless chicken, eggs, beans, and tofu.  Avoiding fatty meat, cured and processed meat, or chicken with skin.  Avoiding premade or processed food.  Eat less than 1,500 mg of salt (sodium) a day.  Limit alcohol use to no more than 1 drink a day for nonpregnant women and 2 drinks a day for men. One drink equals 12 oz of beer, 5 oz of wine, or 1 oz of hard liquor. Lifestyle   Work with your doctor to stay at a healthy weight or to lose weight. Ask your doctor what the best weight is for you.  Get at least 30 minutes of exercise that causes your heart to beat faster  (aerobic exercise) most days of the week. This may include walking, swimming, or biking.  Get at least 30 minutes of exercise that strengthens your muscles (resistance exercise) at least 3 days a week. This may include lifting weights or pilates.  Do not use any products that contain nicotine or tobacco. This includes cigarettes and e-cigarettes. If you need help quitting, ask your doctor.  Check your blood pressure at home as told by your doctor.  Keep all follow-up visits as told by your doctor. This is important. Medicines   Take over-the-counter and prescription medicines only as told by your doctor. Follow directions carefully.  Do not skip doses of blood pressure medicine. The medicine does not work as well if you skip doses. Skipping doses also puts you at risk for problems.  Ask your doctor about side effects or reactions to medicines that you should watch for. Contact a doctor if:  You think you are having a reaction to the medicine you are taking.  You have headaches that keep coming back (recurring).  You feel dizzy.  You have swelling in your ankles.  You have trouble with your vision. Get help right away if:  You get a very bad headache.  You start to feel confused.  You feel weak or numb.  You feel faint.  You get very bad pain in your:  Chest.  Belly (abdomen).  You throw up (vomit) more  than once.  You have trouble breathing. Summary  Hypertension is another name for high blood pressure.  Making healthy choices can help lower blood pressure. If your blood pressure cannot be controlled with healthy choices, you may need to take medicine. This information is not intended to replace advice given to you by your health care provider. Make sure you discuss any questions you have with your health care provider. Document Released: 02/10/2008 Document Revised: 07/22/2016 Document Reviewed: 07/22/2016 Elsevier Interactive Patient Education  2017 [employer].

## 2016-12-03 LAB — COMPLETE METABOLIC PANEL WITH GFR
ALBUMIN: 4.3 g/dL (ref 3.6–5.1)
ALK PHOS: 126 U/L (ref 33–130)
ALT: 11 U/L (ref 6–29)
AST: 17 U/L (ref 10–35)
BUN: 13 mg/dL (ref 7–25)
CO2: 23 mmol/L (ref 20–31)
CREATININE: 1.09 mg/dL — AB (ref 0.50–1.05)
Calcium: 9.1 mg/dL (ref 8.6–10.4)
Chloride: 102 mmol/L (ref 98–110)
GFR, Est African American: 65 mL/min (ref >=60)
GFR, Est Non African American: 56 mL/min — ABNORMAL LOW (ref >=60)
GLUCOSE: 85 mg/dL (ref 65–99)
POTASSIUM: 3.8 mmol/L (ref 3.5–5.3)
SODIUM: 145 mmol/L (ref 135–146)
TOTAL PROTEIN: 6.7 g/dL (ref 6.1–8.1)
Total Bilirubin: 0.5 mg/dL (ref 0.2–1.2)

## 2016-12-03 LAB — CBC
HEMATOCRIT: 35.1 % (ref 35.0–45.0)
HEMOGLOBIN: 11.7 g/dL (ref 11.7–15.5)
MCH: 34.1 pg — ABNORMAL HIGH (ref 27.0–33.0)
MCHC: 33.3 g/dL (ref 32.0–36.0)
MCV: 102.3 fL — ABNORMAL HIGH (ref 80.0–100.0)
MPV: 9.2 fL (ref 7.5–12.5)
Platelets: 358 10*3/uL (ref 140–400)
RBC: 3.43 MIL/uL — AB (ref 3.80–5.10)
RDW: 13.1 % (ref 11.0–15.0)
WBC: 8.9 10*3/uL (ref 3.8–10.8)

## 2016-12-03 LAB — FOLATE: FOLATE: 13.2 ng/mL (ref >5.4)

## 2016-12-03 LAB — VITAMIN D 25 HYDROXY (VIT D DEFICIENCY, FRACTURES): Vit D, 25-Hydroxy: 35 ng/mL (ref 30–100)

## 2016-12-03 LAB — HEPATITIS C ANTIBODY: HCV Ab: NEGATIVE

## 2016-12-03 LAB — VITAMIN B12: Vitamin B-12: 443 pg/mL (ref 200–1100)

## 2016-12-22 ENCOUNTER — Telehealth: Payer: Self-pay

## 2016-12-22 NOTE — Telephone Encounter (Signed)
Called left message for patient to call office to schedule appointment. Received referral from primary care.

## 2016-12-30 ENCOUNTER — Ambulatory Visit (INDEPENDENT_AMBULATORY_CARE_PROVIDER_SITE_OTHER): Payer: Commercial Managed Care - PPO

## 2016-12-30 ENCOUNTER — Ambulatory Visit: Payer: Commercial Managed Care - PPO

## 2016-12-30 DIAGNOSIS — Z1239 Encounter for other screening for malignant neoplasm of breast: Secondary | ICD-10-CM

## 2016-12-30 DIAGNOSIS — R928 Other abnormal and inconclusive findings on diagnostic imaging of breast: Secondary | ICD-10-CM

## 2016-12-31 ENCOUNTER — Other Ambulatory Visit: Payer: Self-pay | Admitting: Family Medicine

## 2016-12-31 DIAGNOSIS — R928 Other abnormal and inconclusive findings on diagnostic imaging of breast: Secondary | ICD-10-CM

## 2017-01-06 ENCOUNTER — Ambulatory Visit (INDEPENDENT_AMBULATORY_CARE_PROVIDER_SITE_OTHER): Payer: Commercial Managed Care - PPO | Admitting: Family Medicine

## 2017-01-06 VITALS — BP 123/56 | HR 74 | Wt 142.0 lb

## 2017-01-06 DIAGNOSIS — I10 Essential (primary) hypertension: Secondary | ICD-10-CM

## 2017-01-06 MED ORDER — LOSARTAN POTASSIUM-HCTZ 50-12.5 MG PO TABS: 2.0000 | ORAL_TABLET | Freq: Every day | ORAL | 1 refills | Status: DC

## 2017-01-06 NOTE — Progress Notes (Signed)
Vitals:   01/06/17 1457  BP: (!) 123/56  Pulse: 74   BP controlled.  Will refill higher dose

## 2017-01-06 NOTE — Progress Notes (Signed)
Pt came into clinic today for BP check. Pt reports at last OV she was advised to take two tablets of the Hyzaar. She has been taking 2 tabs in the am. Pt also switched from Tylenol sinus to Coricidin brand, which has helped her BP come down based on home readings. Pt did not bring in home readings today, but BP was good in office. Does need new Rx of BP med if she is to continue two tabs. Will route to PCP for review.

## 2017-01-08 LAB — LIPID PANEL
Cholesterol: 149 (ref 0–200)
HDL: 54 (ref 35–70)
LDL Cholesterol: 37
Triglycerides: 288 — AB (ref 40–160)

## 2017-01-08 LAB — BASIC METABOLIC PANEL WITH GFR: Glucose: 93

## 2017-01-11 ENCOUNTER — Ambulatory Visit
Admission: RE | Admit: 2017-01-11 | Discharge: 2017-01-11 | Disposition: A | Discharge status: Home / Self Care | Payer: Commercial Managed Care - PPO | Source: Ambulatory Visit | Attending: Family Medicine | Admitting: Family Medicine

## 2017-01-11 ENCOUNTER — Ambulatory Visit (INDEPENDENT_AMBULATORY_CARE_PROVIDER_SITE_OTHER): Payer: Commercial Managed Care - PPO | Admitting: Obstetrics & Gynecology

## 2017-01-11 ENCOUNTER — Encounter: Payer: Self-pay | Admitting: Obstetrics & Gynecology

## 2017-01-11 VITALS — BP 108/65 | HR 92 | Ht 64.0 in | Wt 137.0 lb

## 2017-01-11 DIAGNOSIS — R928 Other abnormal and inconclusive findings on diagnostic imaging of breast: Secondary | ICD-10-CM

## 2017-01-11 DIAGNOSIS — Z01419 Encounter for gynecological examination (general) (routine) without abnormal findings: Secondary | ICD-10-CM | POA: Diagnosis not present

## 2017-01-11 DIAGNOSIS — Z1151 Encounter for screening for human papillomavirus (HPV): Secondary | ICD-10-CM

## 2017-01-11 DIAGNOSIS — Z124 Encounter for screening for malignant neoplasm of cervix: Secondary | ICD-10-CM | POA: Diagnosis not present

## 2017-01-11 NOTE — Progress Notes (Signed)
Subjective:    [name] is a 56 y.o. S P2 (7039 and 8937 yo kids, and 3 grands)  female who presents for an annual exam. The patient has no complaints today. The patient is not currently sexually active for about 2 years. GYN screening history: last pap: was normal about 6 years ago. The patient wears seatbelts: yes. The patient participates in regular exercise: yes. Has the patient ever been transfused or tattooed?: yes. The patient reports that there is not domestic violence in her life.   Menstrual History: OB History    Gravida Para Term Preterm AB Living   2 2 2     2    SAB TAB Ectopic Multiple Live Births                  Menarche age: [age] No LMP recorded. Patient is postmenopausal.    The following portions of the patient's history were reviewed and updated as appropriate: allergies, current medications, past family history, past medical history, past social history, past surgical history and problem list.  Review of Systems Pertinent items are noted in HPI.   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 No gyn/colon cancer Had a colonoscopy at 56 yo, good for 10 years Mammogram UTD Quit smoking about in 2015 when she got diagnosed with a brain aneurysm, does vape occasionally She uses Dr. Denyse Amassorey as a primary care MD    Objective:    BP 108/65   Pulse 92   Ht 5\' 4"  (1.626 m)   Wt 137 lb (62.1 kg)   BMI 23.52 kg/m   General Appearance:    Alert, cooperative, no distress, appears stated age  Head:    Normocephalic, without obvious abnormality, atraumatic  Eyes:    PERRL, conjunctiva/corneas clear, EOM's intact, fundi    benign, both eyes  Ears:    Normal TM's and external ear canals, both ears  Nose:   Nares normal, septum midline, mucosa normal, no drainage    or sinus tenderness  Throat:   Lips, mucosa, and tongue normal; teeth and gums normal  Neck:   Supple, symmetrical, trachea midline,  no adenopathy;    thyroid:  no enlargement/tenderness/nodules; no carotid   bruit or JVD  Back:     Symmetric, no curvature, ROM normal, no CVA tenderness  Lungs:     Clear to auscultation bilaterally, respirations unlabored  Chest Wall:    No tenderness or deformity   Heart:    Regular rate and rhythm, S1 and S2 normal, no murmur, rub   or gallop  Breast Exam:    No tenderness, masses, or nipple abnormality  Abdomen:     Soft, non-tender, bowel sounds active all four quadrants,    no masses, no organomegaly  Genitalia:    Normal female without lesion, discharge or tenderness, red rash on mons (She thinks this is from shaving). I offered derm referral. NSSA, NT, no adnexal masses or tenderness     Extremities:   Extremities normal, atraumatic, no cyanosis or edema  Pulses:   2+ and symmetric all extremities  Skin:   Skin color, texture, turgor normal, no rashes or lesions  Lymph nodes:   Cervical, supraclavicular, and axillary nodes normal  Neurologic:   CNII-XII intact, normal strength, sensation and reflexes    throughout  .    Assessment:    Healthy female exam.    Plan:  Thin prep Pap smear. with cotesting

## 2017-01-13 LAB — CYTOLOGY - PAP
Diagnosis: NEGATIVE
HPV (WINDOPATH): NOT DETECTED

## 2017-01-20 ENCOUNTER — Other Ambulatory Visit: Payer: Self-pay | Admitting: Family Medicine

## 2017-05-25 ENCOUNTER — Encounter: Payer: Self-pay | Admitting: Family Medicine

## 2017-05-25 ENCOUNTER — Ambulatory Visit (INDEPENDENT_AMBULATORY_CARE_PROVIDER_SITE_OTHER): Payer: Commercial Managed Care - PPO | Admitting: Family Medicine

## 2017-05-25 VITALS — BP 129/51 | HR 82 | Ht 64.0 in | Wt 132.7 lb

## 2017-05-25 DIAGNOSIS — H6501 Acute serous otitis media, right ear: Secondary | ICD-10-CM

## 2017-05-25 MED ORDER — PREDNISONE 10 MG PO TABS: 30.0000 mg | ORAL_TABLET | Freq: Every day | ORAL | 0 refills | Status: DC

## 2017-05-25 MED ORDER — CEFDINIR 300 MG PO CAPS: 300.0000 mg | ORAL_CAPSULE | Freq: Two times a day (BID) | ORAL | 0 refills | Status: DC

## 2017-05-25 NOTE — Progress Notes (Signed)
[name] is a 56 y.o. female who presents to [name]: Primary Care Sports Medicine today for decreased hearing right ear. Patient is fullness and decreased hearing in the right ear present for a few days. She denies any fevers or chills nausea vomiting or diarrhea and feels well otherwise.   Past Medical History:  Diagnosis Date  . COPD (chronic obstructive pulmonary disease) (HCC) 09/05/2014   Per notes from ED.    . DDD (degenerative disc disease), lumbar 11/06/2014  . Diplopia 09/05/2014   Resolved. Dr. Doristine Section.    . Former smoker 09/03/2014   Quit date 08/13/14   . Hyperlipidemia 09/05/2014  . Hypertension   . Non-traumatic intracranial subarachnoid hemorrhage (HCC)   . 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 Doristine Section. Given contacts and glasses. Follow up yearly.    . Primary osteoarthritis of both hips 11/07/2014   Past Surgical History:  Procedure Laterality Date  . CARDIAC CATHETERIZATION N/A 07/12/2015   Procedure: Left Heart Cath and Coronary Angiography;  Surgeon: Corky Crafts, MD;  Location: [facility] INVASIVE CV LAB;  Service: Cardiovascular;  Laterality: N/A;  . CESAREAN SECTION N/A   . IR GENERIC HISTORICAL  10/16/2016   IR ANGIO INTRA EXTRACRAN SEL INTERNAL CAROTID BILAT MOD SED 10/16/2016 [name], MD [facility]-INTERV RAD  . IR GENERIC HISTORICAL  10/16/2016   IR ANGIO VERTEBRAL SEL VERTEBRAL BILAT MOD SED 10/16/2016 [name], MD [facility]  . RADIOLOGY WITH ANESTHESIA N/A 08/14/2014   Procedure: Embolization   (RADIOLOGY WITH ANESTHESIA) ;  Surgeon: Medication Radiologist, MD;  Location: [facility] OR;  Service: Radiology;  Laterality: N/A;   Social History  Substance Use Topics  . Smoking status: Light Tobacco Smoker    Packs/day: 0.25  . Smokeless tobacco: Never Used  . Alcohol use 0.0 oz/week     Comment: 2/3 drinks per day   family history  includes Alcohol abuse in her father and mother; Aneurysm in her mother, sister, and sister; Breast cancer in her mother; Cerebral aneurysm in her father; Hyperlipidemia in her mother and sister; Hypertension in her mother and sister; Stroke in her father.  ROS as above:  Medications: Current Outpatient Prescriptions  Medication Sig Dispense Refill  . acetaminophen (TYLENOL) 500 MG tablet Take 1,000 mg by mouth every 6 (six) hours as needed for mild pain (pain).     . Cholecalciferol (VITAMIN D3) 2000 units TABS Take 2,000 Units by mouth daily.    . DiphenhydrAMINE HCl (BENADRYL PO) Take 1 tablet by mouth at bedtime as needed (SLEEP).    Marland Kitchen losartan-hydrochlorothiazide (HYZAAR) 50-12.5 MG tablet Take 2 tablets by mouth daily. 180 tablet 1  . losartan-hydrochlorothiazide (HYZAAR) 50-12.5 MG tablet TAKE 1 TABLET BY MOUTH DAILY. 90 tablet 0  . Multiple Vitamin (MULTIVITAMIN WITH MINERALS) TABS tablet Take 1 tablet by mouth daily. Centrum Silver    . omeprazole (PRILOSEC) 40 MG capsule Take 1 capsule (40 mg total) by mouth daily. 90 capsule 3  . cefdinir (OMNICEF) 300 MG capsule Take 1 capsule (300 mg total) by mouth 2 (two) times daily. 14 capsule 0  . nitroGLYCERIN (NITROSTAT) 0.3 MG SL tablet Place 1 tablet (0.3 mg total) under the tongue every 5 (five) minutes as needed for chest pain. (Patient not taking: Reported on 05/25/2017) 90 tablet 12  . predniSONE (DELTASONE) 10 MG tablet Take 3 tablets (30 mg total) by mouth daily with breakfast. 15 tablet 0   No current facility-administered medications  for this visit.    Allergies  Allergen Reactions  . Aspirin Hives  . Ibuprofen Hives  . Iodine Hives  . Oysters [Shellfish Allergy] Hives and Nausea And Vomiting    Reaction to oysters, clams and crab legs    Health Maintenance Health Maintenance  Topic Date Due  . HIV Screening  05/31/1974  . INFLUENZA VACCINE  04/07/2018 (Originally 04/07/2017)  . MAMMOGRAM  12/31/2018  . COLONOSCOPY   09/08/2019  . PAP SMEAR  01/12/2020  . TETANUS/TDAP  11/10/2025  . Hepatitis C Screening  Completed     Exam:  BP (!) 129/51   Pulse 82   Ht  (1.626 m)   Wt 132 lb 11.2 oz (60.2 kg)   SpO2 99%   BMI 22.78 kg/m  Gen: Well NAD HEENT: EOMI,  MMM Right ear no wax. The eardrum is slightly cloudy with possible fluid behind the ear drum. No erythema. Left is normal. Lungs: Normal work of breathing. CTABL Heart: RRR no MRG Abd: NABS, Soft. Nondistended, Nontender Exts: Brisk capillary refill, warm and well perfused.      No results found for this or any previous visit (from the past 72 hour(s)). No results found.    Assessment and Plan: 56 y.o. female with otitis media likely serous effusion. Plan to treat with oral antihistamines and Flonase nasal spray. If not effective will use prednisone and if worsening to acute otitis media will use backup Omnicef. Recheck in the near future if not improving.   Orders Placed This Encounter  Procedures  . Tympanometry    Standing Status:   Future    Number of Occurrences:   1    Standing Expiration Date:   05/25/2018    Order Specific Question:   Where should this test be performed?    Answer:   Other   Meds ordered this encounter  Medications  . predniSONE (DELTASONE) 10 MG tablet    Sig: Take 3 tablets (30 mg total) by mouth daily with breakfast.    Dispense:  15 tablet    Refill:  0  . cefdinir (OMNICEF) 300 MG capsule    Sig: Take 1 capsule (300 mg total) by mouth 2 (two) times daily.    Dispense:  14 capsule    Refill:  0     Discussed warning signs or symptoms. Please see discharge instructions. Patient expresses understanding.

## 2017-05-25 NOTE — Patient Instructions (Addendum)
Thank you for coming in today. -Use over the counter coricidin.  Also use over the counter zyrtec, or claritin or allegra. (Use the generic version).  Use flonase nasal spray as well for a few weeks.   If not better fill and take the prednisone.   If worse suddenly take the onmicef antibiotic.   Call or go to the emergency room if you get worse, have trouble breathing, have chest pains, or palpitations. '  Fax recent lab report to me at [phone]    Otitis Media, Adult Otitis media is redness, soreness, and puffiness (swelling) in the space just behind your eardrum (middle ear). It may be caused by allergies or infection. It often happens along with a cold. Follow these instructions at home:  Take your medicine as told. Finish it even if you start to feel better.  Only take over-the-counter or prescription medicines for pain, discomfort, or fever as told by your doctor.  Follow up with your doctor as told. Contact a doctor if:  You have otitis media only in one ear, or bleeding from your nose, or both.  You notice a lump on your neck.  You are not getting better in 3-5 days.  You feel worse instead of better. Get help right away if:  You have pain that is not helped with medicine.  You have puffiness, redness, or pain around your ear.  You get a stiff neck.  You cannot move part of your face (paralysis).  You notice that the bone behind your ear hurts when you touch it. This information is not intended to replace advice given to you by your health care provider. Make sure you discuss any questions you have with your health care provider. Document Released: 02/10/2008 Document Revised: 01/30/2016 Document Reviewed: 03/21/2013 Elsevier Interactive Patient Education  2017 [employer].

## 2017-06-10 ENCOUNTER — Encounter: Payer: Self-pay | Admitting: Family Medicine

## 2017-06-10 ENCOUNTER — Ambulatory Visit (INDEPENDENT_AMBULATORY_CARE_PROVIDER_SITE_OTHER): Payer: Commercial Managed Care - PPO | Admitting: Family Medicine

## 2017-06-10 VITALS — BP 116/67 | HR 79 | Ht 64.0 in | Wt 130.0 lb

## 2017-06-10 DIAGNOSIS — H9311 Tinnitus, right ear: Secondary | ICD-10-CM | POA: Diagnosis not present

## 2017-06-10 DIAGNOSIS — H65191 Other acute nonsuppurative otitis media, right ear: Secondary | ICD-10-CM | POA: Diagnosis not present

## 2017-06-10 DIAGNOSIS — H9191 Unspecified hearing loss, right ear: Secondary | ICD-10-CM

## 2017-06-10 NOTE — Patient Instructions (Addendum)
Thank you for coming in today. Let me know if you do not hear from ENT.  Recheck with me as needed.   Let me know if I also need to refer to Audiology.

## 2017-06-11 NOTE — Progress Notes (Signed)
[name] is a 56 y.o. female who presents to [name]: Primary Care Sports Medicine today for right ear decreased hearing. Patient was seen about a month ago for right ear pressure and decreased hearing. She was thought to have an serous effusion and given prednisone and antibiotics. This helped some but she per continues to have bothersome buzzing or ringing in her right ear associated with decreased hearing. At this point if she is failing conservative management she would like a referral to ENT.   Past Medical History:  Diagnosis Date  . COPD (chronic obstructive pulmonary disease) (HCC) 09/05/2014   Per notes from ED.    . DDD (degenerative disc disease), lumbar 11/06/2014  . Diplopia 09/05/2014   Resolved. Dr. Doristine Section.    . Former smoker 09/03/2014   Quit date 08/13/14   . Hyperlipidemia 09/05/2014  . Hypertension   . Non-traumatic intracranial subarachnoid hemorrhage (HCC)   . 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 Doristine Section. Given contacts and glasses. Follow up yearly.    . Primary osteoarthritis of both hips 11/07/2014   Past Surgical History:  Procedure Laterality Date  . CARDIAC CATHETERIZATION N/A 07/12/2015   Procedure: Left Heart Cath and Coronary Angiography;  Surgeon: Corky Crafts, MD;  Location: [facility] INVASIVE CV LAB;  Service: Cardiovascular;  Laterality: N/A;  . CESAREAN SECTION N/A   . IR GENERIC HISTORICAL  10/16/2016   IR ANGIO INTRA EXTRACRAN SEL INTERNAL CAROTID BILAT MOD SED 10/16/2016 [name], MD [facility]  . IR GENERIC HISTORICAL  10/16/2016   IR ANGIO VERTEBRAL SEL VERTEBRAL BILAT MOD SED 10/16/2016 [name], MD [facility]  . RADIOLOGY WITH ANESTHESIA N/A 08/14/2014   Procedure: Embolization   (RADIOLOGY WITH ANESTHESIA) ;  Surgeon: Medication Radiologist, MD;  Location: [facility] OR;  Service: Radiology;  Laterality: N/A;    Social History  Substance Use Topics  . Smoking status: Light Tobacco Smoker    Packs/day: 0.25  . Smokeless tobacco: Never Used  . Alcohol use 0.0 oz/week     Comment: 2/3 drinks per day   family history includes Alcohol abuse in her father and mother; Aneurysm in her mother, sister, and sister; Breast cancer in her mother; Cerebral aneurysm in her father; Hyperlipidemia in her mother and sister; Hypertension in her mother and sister; Stroke in her father.  ROS as above:  Medications: Current Outpatient Prescriptions  Medication Sig Dispense Refill  . acetaminophen (TYLENOL) 500 MG tablet Take 1,000 mg by mouth every 6 (six) hours as needed for mild pain (pain).     . Cholecalciferol (VITAMIN D3) 2000 units TABS Take 2,000 Units by mouth daily.    . DiphenhydrAMINE HCl (BENADRYL PO) Take 1 tablet by mouth at bedtime as needed (SLEEP).    Marland Kitchen losartan-hydrochlorothiazide (HYZAAR) 50-12.5 MG tablet Take 2 tablets by mouth daily. 180 tablet 1  . losartan-hydrochlorothiazide (HYZAAR) 50-12.5 MG tablet TAKE 1 TABLET BY MOUTH DAILY. 90 tablet 0  . Multiple Vitamin (MULTIVITAMIN WITH MINERALS) TABS tablet Take 1 tablet by mouth daily. Centrum Silver    . nitroGLYCERIN (NITROSTAT) 0.3 MG SL tablet Place 1 tablet (0.3 mg total) under the tongue every 5 (five) minutes as needed for chest pain. 90 tablet 12  . omeprazole (PRILOSEC) 40 MG capsule Take 1 capsule (40 mg total) by mouth daily. 90 capsule 3   No current facility-administered medications for this visit.    Allergies  Allergen Reactions  . Aspirin Hives  .  Ibuprofen Hives  . Iodine Hives  . Oysters [Shellfish Allergy] Hives and Nausea And Vomiting    Reaction to oysters, clams and crab legs    Health Maintenance Health Maintenance  Topic Date Due  . HIV Screening  05/31/1974  . MAMMOGRAM  12/31/2018  . COLONOSCOPY  09/08/2019  . PAP SMEAR  01/12/2020  . TETANUS/TDAP  11/10/2025  . INFLUENZA VACCINE  Completed  .  Hepatitis C Screening  Completed     Exam:  BP 116/67   Pulse 79   Ht  (1.626 m)   Wt 130 lb (59 kg)   BMI 22.31 kg/m  Gen: Well NAD HEENT: EOMI,  MMM Right ear tympanic membrane is normal appearing as is left. Normal posterior pharynx. Lungs: Normal work of breathing. CTABL Heart: RRR no MRG Abd: NABS, Soft. Nondistended, Nontender Exts: Brisk capillary refill, warm and well perfused.    No results found for this or any previous visit (from the past 72 hour(s)). No results found.    Assessment and Plan: 55 y.o. female with right ear pressure associated decreased hearing and a buzzing sensation. I suspect patient has both eustachian tube dysfunction limiting motion of the tympanic membrane as well as probably some hearing loss and tinnitus. I think it's reasonable for her to follow-up with ear nose and throat to discuss her options. Additionally I think it's reasonable for her to have an audiology evaluation. We discussed the poor treatment options for tinnitus.     Orders Placed This Encounter  Procedures  . Ambulatory referral to ENT    Referral Priority:   Routine    Referral Type:   Consultation    Referral Reason:   Specialty Services Required    Requested Specialty:   Otolaryngology    Number of Visits Requested:   1   No orders of the defined types were placed in this encounter.    Discussed warning signs or symptoms. Please see discharge instructions. Patient expresses understanding.

## 2017-06-23 ENCOUNTER — Encounter: Payer: Self-pay | Admitting: Family Medicine

## 2017-07-16 ENCOUNTER — Ambulatory Visit (INDEPENDENT_AMBULATORY_CARE_PROVIDER_SITE_OTHER): Payer: Commercial Managed Care - PPO

## 2017-07-16 ENCOUNTER — Encounter: Payer: Self-pay | Admitting: Family Medicine

## 2017-07-16 ENCOUNTER — Ambulatory Visit (INDEPENDENT_AMBULATORY_CARE_PROVIDER_SITE_OTHER): Payer: Commercial Managed Care - PPO | Admitting: Family Medicine

## 2017-07-16 VITALS — BP 133/68 | HR 91 | Wt 131.0 lb

## 2017-07-16 DIAGNOSIS — M79675 Pain in left toe(s): Secondary | ICD-10-CM

## 2017-07-16 MED ORDER — DOXYCYCLINE HYCLATE 100 MG PO TABS: 100.0000 mg | ORAL_TABLET | Freq: Two times a day (BID) | ORAL | 0 refills | Status: DC

## 2017-07-16 NOTE — Patient Instructions (Signed)
Thank you for coming in today. Take doxycycline twice daily for skin infection.  Recheck if not better.   Doxycycline tablets or capsules What is this medicine? DOXYCYCLINE (dox i SYE kleen) is a tetracycline antibiotic. It kills certain bacteria or stops their growth. It is used to treat many kinds of infections, like dental, skin, respiratory, and urinary tract infections. It also treats acne, Lyme disease, malaria, and certain sexually transmitted infections. This medicine may be used for other purposes; ask your health care provider or pharmacist if you have questions. COMMON BRAND NAME(S): Acticlate, Adoxa, Adoxa CK, Adoxa Pak, Adoxa TT, Alodox, Avidoxy, Doxal, Mondoxyne NL, Monodox, Morgidox 1x, Morgidox 1x Kit, Morgidox 2x, Morgidox 2x Kit, NutriDox, Ocudox, TARGADOX, Vibra-Tabs, Vibramycin What should I tell my health care provider before I take this medicine? They need to know if you have any of these conditions: -liver disease -long exposure to sunlight like working outdoors -stomach problems like colitis -an unusual or allergic reaction to doxycycline, tetracycline antibiotics, other medicines, foods, dyes, or preservatives -pregnant or trying to get pregnant -breast-feeding How should I use this medicine? Take this medicine by mouth with a full glass of water. Follow the directions on the prescription label. It is best to take this medicine without food, but if it upsets your stomach take it with food. Take your medicine at regular intervals. Do not take your medicine more often than directed. Take all of your medicine as directed even if you think you are better. Do not skip doses or stop your medicine early. Talk to your pediatrician regarding the use of this medicine in children. While this drug may be prescribed for selected conditions, precautions do apply. Overdosage: If you think you have taken too much of this medicine contact a poison control center or emergency room at  once. NOTE: This medicine is only for you. Do not share this medicine with others. What if I miss a dose? If you miss a dose, take it as soon as you can. If it is almost time for your next dose, take only that dose. Do not take double or extra doses. What may interact with this medicine? -antacids -barbiturates -birth control pills -bismuth subsalicylate -carbamazepine -methoxyflurane -other antibiotics -phenytoin -vitamins that contain iron -warfarin This list may not describe all possible interactions. Give your health care provider a list of all the medicines, herbs, non-prescription drugs, or dietary supplements you use. Also tell them if you smoke, drink alcohol, or use illegal drugs. Some items may interact with your medicine. What should I watch for while using this medicine? Tell your doctor or health care professional if your symptoms do not improve. Do not treat diarrhea with over the counter products. Contact your doctor if you have diarrhea that lasts more than 2 days or if it is severe and watery. Do not take this medicine just before going to bed. It may not dissolve properly when you lay down and can cause pain in your throat. Drink plenty of fluids while taking this medicine to also help reduce irritation in your throat. This medicine can make you more sensitive to the sun. Keep out of the sun. If you cannot avoid being in the sun, wear protective clothing and use sunscreen. Do not use sun lamps or tanning beds/booths. Birth control pills may not work properly while you are taking this medicine. Talk to your doctor about using an extra method of birth control. If you are being treated for a sexually transmitted infection, avoid sexual  contact until you have finished your treatment. Your sexual partner may also need treatment. Avoid antacids, aluminum, calcium, magnesium, and iron products for 4 hours before and 2 hours after taking a dose of this medicine. If you are using  this medicine to prevent malaria, you should still protect yourself from contact with mosquitos. Stay in screened-in areas, use mosquito nets, keep your body covered, and use an insect repellent. What side effects may I notice from receiving this medicine? Side effects that you should report to your doctor or health care professional as soon as possible: -allergic reactions like skin rash, itching or hives, swelling of the face, lips, or tongue -difficulty breathing -fever -itching in the rectal or genital area -pain on swallowing -redness, blistering, peeling or loosening of the skin, including inside the mouth -severe stomach pain or cramps -unusual bleeding or bruising -unusually weak or tired -yellowing of the eyes or skin Side effects that usually do not require medical attention (report to your doctor or health care professional if they continue or are bothersome): -diarrhea -loss of appetite -nausea, vomiting This list may not describe all possible side effects. Call your doctor for medical advice about side effects. You may report side effects to FDA at 1-800-FDA-1088. Where should I keep my medicine? Keep out of the reach of children. Store at room temperature, below 30 degrees C (86 degrees F). Protect from light. Keep container tightly closed. Throw away any unused medicine after the expiration date. Taking this medicine after the expiration date can make you seriously ill. NOTE: This sheet is a summary. It may not cover all possible information. If you have questions about this medicine, talk to your doctor, pharmacist, or health care provider.  2018 Elsevier/Gold Standard (2015-09-25 17:11:22)

## 2017-07-16 NOTE — Progress Notes (Signed)
Samara DeistSharon R Zeisler is a 56 y.o. female who presents to [facility] today for left toe pain. Patient notes a several day history of left second toe redness swelling and pain. She can't recall any injury. She notes without any treatment the toe seems to be improving some. She has the pain is interfering with her ability to walk normally in shoes. She feels well otherwise denies any fevers or chills.   Past Medical History:  Diagnosis Date  . COPD (chronic obstructive pulmonary disease) (HCC) 09/05/2014   Per notes from ED.    . DDD (degenerative disc disease), lumbar 11/06/2014  . Diplopia 09/05/2014   Resolved. Dr. Doristine SectionSubramanian.    . Former smoker 09/03/2014   Quit date 08/13/14   . Hyperlipidemia 09/05/2014  . Hypertension   . Non-traumatic intracranial subarachnoid hemorrhage (HCC)   . 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 Doristine SectionSubramanian. Given contacts and glasses. Follow up yearly.    . Primary osteoarthritis of both hips 11/07/2014   Past Surgical History:  Procedure Laterality Date  . CESAREAN SECTION N/A   . IR GENERIC HISTORICAL  10/16/2016   IR ANGIO INTRA EXTRACRAN SEL INTERNAL CAROTID BILAT MOD SED 10/16/2016 Lisbeth RenshawNeelesh Nundkumar, MD [facility]INTER[facility]  . IR GENERIC HISTORICAL  10/16/2016   IR ANGIO VERTEBRAL SEL VERTEBRAL BILAT MOD SED 10/16/2016 Lisbeth RenshawNeelesh Nundkumar, MD [facility]   Social History   Tobacco Use  . Smoking status: Light Tobacco Smoker    Packs/day: 0.25  . Smokeless tobacco: Never Used  Substance Use Topics  . Alcohol use: Yes    Alcohol/week: 0.0 oz    Comment: 2/3 drinks per day     ROS:  As above   Medications: Current Outpatient Medications  Medication Sig Dispense Refill  . acetaminophen (TYLENOL) 500 MG tablet Take 1,000 mg by mouth every 6 (six) hours as needed for mild pain (pain).     . Cholecalciferol (VITAMIN D3) 2000 units TABS Take 2,000 Units by mouth daily.    . DiphenhydrAMINE HCl (BENADRYL  PO) Take 1 tablet by mouth at bedtime as needed (SLEEP).    Marland Kitchen. losartan-hydrochlorothiazide (HYZAAR) 50-12.5 MG tablet Take 2 tablets by mouth daily. 180 tablet 1  . Multiple Vitamin (MULTIVITAMIN WITH MINERALS) TABS tablet Take 1 tablet by mouth daily. Centrum Silver    . nitroGLYCERIN (NITROSTAT) 0.3 MG SL tablet Place 1 tablet (0.3 mg total) under the tongue every 5 (five) minutes as needed for chest pain. 90 tablet 12  . omeprazole (PRILOSEC) 40 MG capsule Take 1 capsule (40 mg total) by mouth daily. 90 capsule 3  . doxycycline (VIBRA-TABS) 100 MG tablet Take 1 tablet (100 mg total) 2 (two) times daily by mouth. 14 tablet 0   No current facility-administered medications for this visit.    Allergies  Allergen Reactions  . Aspirin Hives  . Ibuprofen Hives  . Iodine Hives  . Oysters [Shellfish Allergy] Hives and Nausea And Vomiting    Reaction to oysters, clams and crab legs     Exam:  BP 133/68   Pulse 91   Wt 131 lb (59.4 kg)   SpO2 100%   BMI 22.49 kg/m  General: Well Developed, well nourished, and in no acute distress.  Neuro/Psych: Alert and oriented x3, extra-ocular muscles intact, able to move all 4 extremities, sensation grossly intact. Skin: Warm and dry, no rashes noted.  Respiratory: Not using accessory muscles, speaking in full sentences, trachea midline.  Cardiovascular: Pulses palpable,  no extremity edema. Abdomen: Does not appear distended. MSK: Left foot: Normal-appearing with the exception of the second toe which is slightly longer than the first toe. The skin is erythematous and slightly tender to touch. No obvious fluctuance or abscess formation is present. Motion is intact as is capillary refill and sensation. Pulses are intact at the dorsal pedis and posterior tibialis.    No results found for this or any previous visit (from the past 48 hour(s)). Dg Foot Complete Left  Result Date: 07/16/2017 CLINICAL DATA:  Pain for several days EXAM: LEFT FOOT - COMPLETE  3+ VIEW COMPARISON:  None. FINDINGS: Frontal, oblique, and lateral views were obtained. There is no fracture or dislocation. Joint spaces appear normal. No erosive change. Bones appear somewhat osteoporotic. IMPRESSION: Bones appear somewhat osteoporotic. No fracture or dislocation. No evident arthropathy. Electronically Signed   By: Bretta BangWilliam  Woodruff III M.D.   On: 07/16/2017 11:05      Assessment and Plan: 56 y.o. female with left second toe pain and redness and swelling without injury or fracture seen on x-ray. I suspect this is cellulitis possible paronychia. Plan for them. Treatment with doxycycline. If not better will proceed with rheumatologic workup. Recommend buddy tape as well.    Orders Placed This Encounter  Procedures  . DG Foot Complete Left    Standing Status:   Future    Number of Occurrences:   1    Standing Expiration Date:   09/15/2018    Order Specific Question:   Reason for Exam (SYMPTOM  OR DIAGNOSIS REQUIRED)    Answer:   eval 2nd toe pain? fx?    Order Specific Question:   Is patient pregnant?    Answer:   No    Order Specific Question:   Preferred imaging location?    [facility]    Order Specific Question:   Radiology Contrast Protocol - do NOT remove file path    Answer:   [email or web address]   Meds ordered this encounter  Medications  . doxycycline (VIBRA-TABS) 100 MG tablet    Sig: Take 1 tablet (100 mg total) 2 (two) times daily by mouth.    Dispense:  14 tablet    Refill:  0    Discussed warning signs or symptoms. Please see discharge instructions. Patient expresses understanding.

## 2017-08-03 ENCOUNTER — Other Ambulatory Visit: Payer: Self-pay | Admitting: Family Medicine

## 2017-08-10 ENCOUNTER — Encounter: Payer: Self-pay | Admitting: Family Medicine

## 2017-08-10 ENCOUNTER — Ambulatory Visit (INDEPENDENT_AMBULATORY_CARE_PROVIDER_SITE_OTHER): Payer: Commercial Managed Care - PPO | Admitting: Family Medicine

## 2017-08-10 VITALS — BP 125/73 | HR 71 | Ht 64.0 in | Wt 130.0 lb

## 2017-08-10 DIAGNOSIS — H1033 Unspecified acute conjunctivitis, bilateral: Secondary | ICD-10-CM | POA: Diagnosis not present

## 2017-08-10 MED ORDER — POLYMYXIN B-TRIMETHOPRIM 10000-0.1 UNIT/ML-% OP SOLN: 2.0000 [drp] | Freq: Four times a day (QID) | OPHTHALMIC | 0 refills | Status: DC

## 2017-08-10 NOTE — Patient Instructions (Addendum)
Thank you for coming in today. Use over-the-counter Zaditor eyedrops (Ketotifen) Use over-the-counter Zyrtec (cetirizine)  Use Systane artificial tears as needed  Use the antibiotic eye drops as needed if not better or if worse.   Return as scheduled.    Allergic Conjunctivitis, Adult Allergic conjunctivitis is inflammation of the clear membrane that covers the white part of your eye and the inner surface of your eyelid (conjunctiva). The inflammation is caused by allergies. The blood vessels in the conjunctiva become inflamed and this causes the eyes to become red or pink. The eyes often feel itchy. Allergic conjunctivitis cannot be spread from one person to another person (is not contagious). What are the causes? This condition is caused by an allergic reaction. Common causes of an allergic reaction (allergens) include:  Outdoor allergens, such as: ? Pollen. ? Grass and weeds. ? Mold spores.  Indoor allergens, such as: ? Dust. ? Smoke. ? Mold. ? Pet dander. ? Animal hair.  What increases the risk? You may be more likely to develop this condition if you have a family history of allergies, such as:  Allergic rhinitis.  Bronchial asthma.  Atopic dermatitis.  What are the signs or symptoms? Symptoms of this condition include eyes that are:  Itchy.  Red.  Watery.  Puffy.  Your eyes may also:  Sting or burn.  Have clear drainage coming from them.  How is this diagnosed? This condition may be diagnosed by medical history and physical exam. If you have drainage from your eyes, it may be tested to rule out other causes of conjunctivitis. You may also need to see a health care provider who specializes in treating allergies (allergist) or eye conditions (ophthalmologist) for tests to confirm the diagnosis. You may have:  Skin tests to see which allergens are causing your symptoms. These tests involve pricking the skin with a tiny needle and exposing the skin to small  amounts of potential allergens to see if your skin reacts.  Blood tests.  Tissue scrapings from your eyelid. These will be examined under a microscope.  How is this treated? Treatments for this condition may include:  Cold cloths (compresses) to soothe itching and swelling.  Washing the face to remove allergens.  Eye drops. These may be prescription or over-the-counter. There are several different types. You may need to try different types to see which one works best for you. Your may need: ? Eye drops that block the allergic reaction (antihistamine). ? Eye drops that reduce swelling and irritation (anti-inflammatory). ? Steroid eye drops to lessen a severe reaction (vernal conjunctivitis).  Oral antihistamine medicines to reduce your allergic reaction. You may need these if eye drops do not help or are difficult to use.  Follow these instructions at home:  Avoid known allergens whenever possible.  Take or apply over-the-counter and prescription medicines only as told by your health care provider. These include any eye drops.  Apply a cool, clean washcloth to your eye for 10-20 minutes, 3-4 times a day.  Do not touch or rub your eyes.  Do not wear contact lenses until the inflammation is gone. Wear glasses instead.  Do not wear eye makeup until the inflammation is gone.  Keep all follow-up visits as told by your health care provider. This is important. Contact a health care provider if:  Your symptoms get worse or do not improve with treatment.  You have mild eye pain.  You have sensitivity to light.  You have spots or blisters on your  eyes.  You have pus draining from your eye.  You have a fever. Get help right away if:  You have redness, swelling, or other symptoms in only one eye.  Your vision is blurred or you have vision changes.  You have severe eye pain. This information is not intended to replace advice given to you by your health care provider. Make  sure you discuss any questions you have with your health care provider. Document Released: 11/14/2002 Document Revised: 04/22/2016 Document Reviewed: 03/06/2016 Elsevier Interactive Patient Education  2018 [employer]

## 2017-08-10 NOTE — Progress Notes (Signed)
[name] is a 56 y.o. female who [facility] today for itchy, dry eyes.   Patient presents with 1 day history of itchy, dry eyes L > R. Patient first noticed itchiness yesterday while at work and thought her contacts were just irritating her eyes. Itchiness persisted through night and she woke up with redness in bilateral eyes L > R. No eye pain. No systemic symptoms. Patient did have guest in house over the weekend who now has pink eye.   Otherwise, she is doing well without symptoms. Has not been ill recently. She already takes daily OTC antihistamine.    Past Medical History:  Diagnosis Date  . COPD (chronic obstructive pulmonary disease) (HCC) 09/05/2014   Per notes from ED.    . DDD (degenerative disc disease), lumbar 11/06/2014  . Diplopia 09/05/2014   Resolved. Dr. Doristine SectionSubramanian.    . Former smoker 09/03/2014   Quit date 08/13/14   . Hyperlipidemia 09/05/2014  . Hypertension   . Non-traumatic intracranial subarachnoid hemorrhage (HCC)   . 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 Doristine SectionSubramanian. Given contacts and glasses. Follow up yearly.    . Primary osteoarthritis of both hips 11/07/2014   Past Surgical History:  Procedure Laterality Date  . CARDIAC CATHETERIZATION N/A 07/12/2015   Procedure: Left Heart Cath and Coronary Angiography;  Surgeon: Corky CraftsJayadeep S Varanasi, MD;  [facility];  Service: Cardiovascular;  Laterality: N/A;  . CESAREAN SECTION N/A   . IR GENERIC HISTORICAL  10/16/2016   IR ANGIO INTRA EXTRACRAN SEL INTERNAL CAROTID BILAT MOD SED 10/16/2016 Lisbeth RenshawNeelesh Nundkumar, MD [facility]  . IR GENERIC HISTORICAL  10/16/2016   IR ANGIO VERTEBRAL SEL VERTEBRAL BILAT MOD SED 10/16/2016 Lisbeth RenshawNeelesh Nundkumar, MD [facility]  . RADIOLOGY WITH ANESTHESIA N/A 08/14/2014   Procedure: Embolization   (RADIOLOGY WITH ANESTHESIA) ;   Surgeon: Medication Radiologist, MD;  Location: [facility];  Service: Radiology;  Laterality: N/A;   Social History   Tobacco Use  . Smoking status: Light Tobacco Smoker    Packs/day: 0.25  . Smokeless tobacco: Never Used  Substance Use Topics  . Alcohol use: Yes    Alcohol/week: 0.0 oz    Comment: 2/3 drinks per day   family history includes Alcohol abuse in her father and mother; Aneurysm in her mother, sister, and sister; Breast cancer in her mother; Cerebral aneurysm in her father; Hyperlipidemia in her mother and sister; Hypertension in her mother and sister; Stroke in her father.  ROS as above:  Medications: Current Outpatient Medications  Medication Sig Dispense Refill  . acetaminophen (TYLENOL) 500 MG tablet Take 1,000 mg by mouth every 6 (six) hours as needed for mild pain (pain).     . Cholecalciferol (VITAMIN D3) 2000 units TABS Take 2,000 Units by mouth daily.    . DiphenhydrAMINE HCl (BENADRYL PO) Take 1 tablet by mouth at bedtime as needed (SLEEP).    Marland Kitchen. doxycycline (VIBRA-TABS) 100 MG tablet Take 1 tablet (100 mg total) 2 (two) times daily by mouth. 14 tablet 0  . losartan-hydrochlorothiazide (HYZAAR) 50-12.5 MG tablet TAKE 2 TABLETS BY MOUTH EVERY DAY 180 tablet 1  . Multiple Vitamin (MULTIVITAMIN WITH MINERALS) TABS tablet Take 1 tablet by mouth daily. Centrum Silver    . nitroGLYCERIN (NITROSTAT) 0.3 MG SL tablet Place 1 tablet (0.3 mg total) under the tongue every 5 (five) minutes as needed for chest pain. 90 tablet 12  .  omeprazole (PRILOSEC) 40 MG capsule Take 1 capsule (40 mg total) by mouth daily. 90 capsule 3  . trimethoprim-polymyxin b (POLYTRIM) ophthalmic solution Place 2 drops into both eyes every 6 (six) hours. 10 mL 0   No current facility-administered medications for this visit.    Allergies  Allergen Reactions  . Aspirin Hives  . Ibuprofen Hives  . Iodine Hives  . Oysters [Shellfish Allergy] Hives and Nausea And Vomiting    Reaction to oysters, clams  and crab legs    Health Maintenance Health Maintenance  Topic Date Due  . HIV Screening  05/31/1974  . MAMMOGRAM  12/31/2018  . COLONOSCOPY  09/08/2019  . PAP SMEAR  01/12/2020  . TETANUS/TDAP  11/10/2025  . INFLUENZA VACCINE  Completed  . Hepatitis C Screening  Completed     Exam:  BP 125/73   Pulse 71   Ht 5\' 4"  (1.626 m)   Wt 130 lb (59 kg)   BMI 22.31 kg/m  Gen: Well NAD HEENT: EOMI,  MMM. Bilateral trace conjunctivitis L > R. No discharge. No foreign body or obvious corneal defect.  Lungs: Normal work of breathing. CTABL Heart: RRR no MRG Abd: NABS, Soft. Nondistended, Nontender Exts: Brisk capillary refill, warm and well perfused.    No results found for this or any previous visit (from the past 72 hour(s)). No results found.    Assessment and Plan: 56 y.o. female with likely allergic vs viral conjunctivitis. Bacterial etiology very unlikely.   Patient will benefit from symptomatic management. She should continue OTC allergy medication and utilize topical antihistamine drop for itchiness. Can also consider artificial tears if eyes continue to feel dry and irritated. Recommended discarding current contacts and wearing glasses for next week or until several days after resolution.  Patient should return if worsening or not better.    No orders of the defined types were placed in this encounter.  Meds ordered this encounter  Medications  . trimethoprim-polymyxin b (POLYTRIM) ophthalmic solution    Sig: Place 2 drops into both eyes every 6 (six) hours.    Dispense:  10 mL    Refill:  0     Discussed warning signs or symptoms. Please see discharge instructions. Patient expresses understanding.

## 2017-10-14 ENCOUNTER — Telehealth: Payer: Self-pay

## 2017-10-14 DIAGNOSIS — I608 Other nontraumatic subarachnoid hemorrhage: Secondary | ICD-10-CM

## 2017-10-14 NOTE — Telephone Encounter (Signed)
Patient requested to have BUN and Creatin done here in the office before she gets a MRI done. Raynell Scott,CMA

## 2017-10-15 ENCOUNTER — Telehealth: Payer: Self-pay

## 2017-10-15 DIAGNOSIS — I608 Other nontraumatic subarachnoid hemorrhage: Secondary | ICD-10-CM

## 2017-10-15 LAB — BUN/CREATININE RATIO
BUN: 17 mg/dL (ref 7–25)
CREATININE: 1.02 mg/dL (ref 0.50–1.05)
GFR, Est African American: 70 mL/min/{1.73_m2} (ref >=60)
GFR, Est Non African American: 61 mL/min/{1.73_m2} (ref >=60)

## 2017-10-15 NOTE — Telephone Encounter (Signed)
Patient is having a MRI done and she need to have a BUN /Creatin labs drawn prior to MRI. Order has been places. Freeman Borba,CMA

## 2017-10-21 ENCOUNTER — Other Ambulatory Visit: Payer: Self-pay | Admitting: Neurosurgery

## 2017-10-25 ENCOUNTER — Other Ambulatory Visit (HOSPITAL_COMMUNITY): Payer: Self-pay | Admitting: Neurosurgery

## 2017-11-02 DIAGNOSIS — R519 Headache, unspecified: Secondary | ICD-10-CM | POA: Insufficient documentation

## 2017-11-02 DIAGNOSIS — I609 Nontraumatic subarachnoid hemorrhage, unspecified: Secondary | ICD-10-CM | POA: Diagnosis present

## 2017-11-04 ENCOUNTER — Encounter: Payer: Self-pay | Admitting: Neurology

## 2017-12-01 ENCOUNTER — Telehealth: Payer: Self-pay | Admitting: Family Medicine

## 2017-12-01 MED ORDER — HYDROCHLOROTHIAZIDE 12.5 MG PO TABS: 12.5000 mg | ORAL_TABLET | Freq: Every day | ORAL | 3 refills | Status: DC

## 2017-12-01 MED ORDER — LOSARTAN POTASSIUM 50 MG PO TABS: 50.0000 mg | ORAL_TABLET | Freq: Every day | ORAL | 3 refills | Status: DC

## 2017-12-01 NOTE — Telephone Encounter (Signed)
Pt advised.

## 2017-12-01 NOTE — Telephone Encounter (Signed)
Patient came in to request an alternative medicine for blood pressure. [facility] will not refill her Losartan due to the recall. She has already been out of meds for 2 days. Please advise. Thank you!

## 2017-12-01 NOTE — Telephone Encounter (Signed)
Losartan/HCTZ 50-12.5 is not available but the individual components are. Individual prescriptions sent in.  I called and left a message with the patient.

## 2017-12-01 NOTE — Telephone Encounter (Signed)
Routing to PCP for review.

## 2017-12-25 ENCOUNTER — Other Ambulatory Visit: Payer: Self-pay | Admitting: Family Medicine

## 2017-12-31 ENCOUNTER — Encounter: Payer: Self-pay | Admitting: Neurology

## 2018-02-14 ENCOUNTER — Ambulatory Visit: Payer: Commercial Managed Care - PPO | Admitting: Neurology

## 2018-02-23 ENCOUNTER — Other Ambulatory Visit: Payer: Self-pay | Admitting: Family Medicine

## 2018-02-24 ENCOUNTER — Other Ambulatory Visit: Payer: Self-pay | Admitting: Family Medicine

## 2018-03-21 ENCOUNTER — Encounter: Payer: Self-pay | Admitting: Family Medicine

## 2018-03-21 ENCOUNTER — Ambulatory Visit: Payer: Commercial Managed Care - PPO | Admitting: Family Medicine

## 2018-03-21 VITALS — BP 132/63 | HR 68 | Ht 63.0 in | Wt 130.0 lb

## 2018-03-21 DIAGNOSIS — I10 Essential (primary) hypertension: Secondary | ICD-10-CM | POA: Diagnosis not present

## 2018-03-21 MED ORDER — LOSARTAN POTASSIUM 100 MG PO TABS: 100.0000 mg | ORAL_TABLET | Freq: Every day | ORAL | 3 refills | Status: DC

## 2018-03-21 MED ORDER — HYDROCHLOROTHIAZIDE 25 MG PO TABS: 25.0000 mg | ORAL_TABLET | Freq: Every day | ORAL | 3 refills | Status: DC

## 2018-03-21 NOTE — Patient Instructions (Signed)
Thank you for coming in today. Increase losartan to 100mg  and HCTZ to 25  Mg daily.  Recheck in 6 months.  You are due for labs soonish. We will do labs in October  Send me a copy of the labs they want.  I will add on extra if needed.  \\Let me know when you get a flu shot.

## 2018-03-21 NOTE — Progress Notes (Signed)
Gabrielle DeistSharon R Porter is a 57 y.o. female who [facility] today for follow-up hypertension.  Gabrielle DecemberSharon has a history of hypertension.  She previously was pretty well managed on losartan/hydrochlorothiazide.  The combination pill was unavailable and she was split to the 50 mg of losartan and 12.5 mg of hydrochlorothiazide.  She notes that her dose that she was taking with the combination pill is actually 100/25.  On the lower dose she notes that she is been having a bit more fatigue or irritability that she suspects is due to her hypertension.  She notes her blood pressures been a bit higher at home in the 130s.  She denies chest pain or palpitations.  She would like to increase the dose to 100/25 if possible.   ROS as above:  Exam:  BP 132/63   Pulse 68   Ht 5\' 3"  (1.6 m)   Wt 130 lb (59 kg)   BMI 23.03 kg/m  Gen: Well NAD HEENT: EOMI,  MMM Lungs: Normal work of breathing. CTABL Heart: RRR no MRG Abd: NABS, Soft. Nondistended, Nontender Exts: Brisk capillary refill, warm and well perfused.   Lab and Radiology Results   Chemistry      Component Value Date/Time   NA 145 12/02/2016 1543   K 3.8 12/02/2016 1543   CL 102 12/02/2016 1543   CO2 23 12/02/2016 1543   BUN 17 10/15/2017 1456   CREATININE 1.02 10/15/2017 1456   GLU 93 01/08/2017      Component Value Date/Time   CALCIUM 9.1 12/02/2016 1543   ALKPHOS 126 12/02/2016 1543   AST 17 12/02/2016 1543   ALT 11 12/02/2016 1543   BILITOT 0.5 12/02/2016 1543        Assessment and Plan: 57 y.o. female with  Hypertension: Blood pressure a bit elevated today.  Plan to increase the losartan/hydrochlorothiazide dose to 100/25.  Plan to recheck in about 5 months for well adult visit.    No orders of the defined types were placed in this encounter.  No orders of the defined types were placed in this  encounter.    Historical information moved to improve visibility of documentation.  Past Medical History:  Diagnosis Date  . COPD (chronic obstructive pulmonary disease) (HCC) 09/05/2014   Per notes from ED.    . DDD (degenerative disc disease), lumbar 11/06/2014  . Diplopia 09/05/2014   Resolved. Dr. Doristine SectionSubramanian.    . Former smoker 09/03/2014   Quit date 08/13/14   . Hyperlipidemia 09/05/2014  . Hypertension   . Non-traumatic intracranial subarachnoid hemorrhage (HCC)   . 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 Doristine SectionSubramanian. Given contacts and glasses. Follow up yearly.    . Primary osteoarthritis of both hips 11/07/2014   Past Surgical History:  Procedure Laterality Date  . CARDIAC CATHETERIZATION N/A 07/12/2015   Procedure: Left Heart Cath and Coronary Angiography;  Surgeon: Corky CraftsJayadeep S Varanasi, MD;  [facility];  Service: Cardiovascular;  Laterality: N/A;  . CESAREAN SECTION N/A   . IR GENERIC HISTORICAL  10/16/2016   IR ANGIO INTRA EXTRACRAN SEL INTERNAL CAROTID BILAT MOD SED 10/16/2016 Lisbeth RenshawNeelesh Nundkumar, MD [facility]  . IR GENERIC HISTORICAL  10/16/2016   IR ANGIO VERTEBRAL SEL VERTEBRAL BILAT MOD SED 10/16/2016 Lisbeth RenshawNeelesh Nundkumar, MD [facility]  . RADIOLOGY WITH ANESTHESIA N/A 08/14/2014   Procedure: Embolization   (RADIOLOGY WITH ANESTHESIA) ;  Surgeon: Medication Radiologist, MD;  Location: [facility];  Service: Radiology;  Laterality: N/A;   Social History   Tobacco Use  . Smoking status: Light Tobacco Smoker    Packs/day: 0.25  . Smokeless tobacco: Never Used  Substance Use Topics  . Alcohol use: Yes    Alcohol/week: 0.0 oz    Comment: 2/3 drinks per day   family history includes Alcohol abuse in her father and mother; Aneurysm in her mother, sister, and sister; Breast cancer in her mother; Cerebral aneurysm in her father; Hyperlipidemia in her mother and sister; Hypertension in her mother and sister; Stroke in her father.  Medications: Current  Outpatient Medications  Medication Sig Dispense Refill  . acetaminophen (TYLENOL) 500 MG tablet Take 1,000 mg by mouth every 6 (six) hours as needed for mild pain (pain).     . Cholecalciferol (VITAMIN D3) 2000 units TABS Take 2,000 Units by mouth daily.    . DiphenhydrAMINE HCl (BENADRYL PO) Take 1 tablet by mouth at bedtime as needed (SLEEP).    . hydrochlorothiazide (HYDRODIURIL) 12.5 MG tablet TAKE 1 TABLET BY MOUTH EVERY DAY 90 tablet 2  . losartan (COZAAR) 50 MG tablet Take 1 tablet (50 mg total) by mouth daily. NO FURTHER REFILLS UNTIL SEEN IN OFFICE 15 tablet 0  . Multiple Vitamin (MULTIVITAMIN WITH MINERALS) TABS tablet Take 1 tablet by mouth daily. Centrum Silver    . nitroGLYCERIN (NITROSTAT) 0.3 MG SL tablet Place 1 tablet (0.3 mg total) under the tongue every 5 (five) minutes as needed for chest pain. 90 tablet 12  . omeprazole (PRILOSEC) 40 MG capsule TAKE 1 CAPSULE (40 MG TOTAL) BY MOUTH DAILY. 90 capsule 0  . trimethoprim-polymyxin b (POLYTRIM) ophthalmic solution Place 2 drops into both eyes every 6 (six) hours. 10 mL 0   No current facility-administered medications for this visit.    Allergies  Allergen Reactions  . Aspirin Hives  . Ibuprofen Hives  . Iodine Hives  . Oysters [Shellfish Allergy] Hives and Nausea And Vomiting    Reaction to oysters, clams and crab legs     Discussed warning signs or symptoms. Please see discharge instructions. Patient expresses understanding.

## 2018-03-24 ENCOUNTER — Other Ambulatory Visit: Payer: Self-pay | Admitting: Family Medicine

## 2018-05-26 ENCOUNTER — Other Ambulatory Visit: Payer: Self-pay

## 2018-05-26 MED ORDER — HYDROCHLOROTHIAZIDE 25 MG PO TABS: 25.0000 mg | ORAL_TABLET | Freq: Every day | ORAL | 3 refills | Status: DC

## 2018-06-27 ENCOUNTER — Ambulatory Visit: Payer: Commercial Managed Care - PPO | Admitting: Obstetrics & Gynecology

## 2018-06-27 ENCOUNTER — Encounter: Payer: Self-pay | Admitting: Obstetrics & Gynecology

## 2018-06-27 VITALS — BP 112/66 | HR 74 | Resp 16 | Ht 64.0 in | Wt 133.0 lb

## 2018-06-27 DIAGNOSIS — N950 Postmenopausal bleeding: Secondary | ICD-10-CM | POA: Diagnosis not present

## 2018-06-27 NOTE — Progress Notes (Signed)
   Subjective:    Patient ID: [name], female    DOB: 1961/04/22, 57 y.o.   MRN: [id number]  HPI  57 yo female has experience spotting 3 times this year.  And has had spotting a couple other times since menopause.  But nothing this frequent.  Denies sexual intercourse or trauma.  Patient denies bladder or bowel complaints.  Review of Systems  Constitutional: Negative.   Respiratory: Negative.   Cardiovascular: Negative.   Gastrointestinal: Negative.   Genitourinary: Negative.   Psychiatric/Behavioral: Negative.        Objective:   Physical Exam  Constitutional: She is oriented to person, place, and time. She appears well-developed and well-nourished. No distress.  HENT:  Head: Normocephalic and atraumatic.  Eyes: Conjunctivae are normal.  Cardiovascular: Normal rate.  Pulmonary/Chest: Effort normal.  Abdominal: Soft. Bowel sounds are normal. She exhibits no distension and no mass. There is no tenderness. There is no rebound and no guarding.  Genitourinary:  Genitourinary Comments: Tanner V Vulva: No lesion Vagina: Pale pink, no lesion Vertex: No lesion, no CMT Uterus and adnexa no masses and nontender.  Musculoskeletal: She exhibits no edema.  Neurological: She is alert and oriented to person, place, and time.  Skin: Skin is warm and dry.  Psychiatric: She has a normal mood and affect.  Vitals reviewed.  Vitals:   06/27/18 1555  BP: 112/66  Pulse: 74  Resp: 16  Weight: 133 lb (60.3 kg)  Height: 5\' 4"  (1.626 m)       Assessment & Plan:  57 year old female with postmenopausal bleeding  1.  Physical exam 2.  Transvaginal ultrasound 3.  We will base treatment on results.

## 2018-06-28 ENCOUNTER — Ambulatory Visit (INDEPENDENT_AMBULATORY_CARE_PROVIDER_SITE_OTHER): Payer: Commercial Managed Care - PPO

## 2018-06-28 DIAGNOSIS — N950 Postmenopausal bleeding: Secondary | ICD-10-CM

## 2018-07-12 ENCOUNTER — Telehealth: Payer: Self-pay | Admitting: *Deleted

## 2018-07-12 NOTE — Telephone Encounter (Signed)
-----   Message from Lesly Dukes, MD sent at 07/08/2018  4:00 PM EDT ----- Lining thin but did not see upper portion of endometrium on TV or abd approach.  RN / MD to call patient on 07/11/18 and discuss.  Endometrial biospy can be done to sample upper portion or rpt [place] in a few weeks to see upper portion of endometrium.

## 2018-07-12 NOTE — Telephone Encounter (Signed)
Pt notified of Pelvic U/S results.  She has opted to go ahead with the Endometrial biopsy.  This is scheduled  For 07/25/18

## 2018-07-25 ENCOUNTER — Other Ambulatory Visit: Payer: Commercial Managed Care - PPO | Admitting: Obstetrics & Gynecology

## 2018-07-26 ENCOUNTER — Ambulatory Visit (INDEPENDENT_AMBULATORY_CARE_PROVIDER_SITE_OTHER): Payer: Commercial Managed Care - PPO | Admitting: Family Medicine

## 2018-07-26 VITALS — BP 155/59 | HR 77 | Ht 64.0 in | Wt 136.0 lb

## 2018-07-26 DIAGNOSIS — M79645 Pain in left finger(s): Secondary | ICD-10-CM

## 2018-07-26 DIAGNOSIS — I7300 Raynaud's syndrome without gangrene: Secondary | ICD-10-CM | POA: Diagnosis not present

## 2018-07-26 NOTE — Patient Instructions (Addendum)
Thank you for coming in today. I think this may be Raynaud Keep the hand warm Get labs and [place] soon.  You should hear about ultrasound.  Keep me updated.   Keep track of blood pressure.     Raynaud Phenomenon Raynaud phenomenon is a condition that affects the blood vessels (arteries) that carry blood to your fingers and toes. The arteries that supply blood to your ears or the tip of your nose might also be affected. Raynaud phenomenon causes the arteries to temporarily narrow. As a result, the flow of blood to the affected areas is temporarily decreased. This usually occurs in response to cold temperatures or stress. During an attack, the skin in the affected areas turns white. You may also feel tingling or numbness in those areas. Attacks usually last for only a brief period, and then the blood flow to the area returns to normal. In most cases, Raynaud phenomenon does not cause serious health problems. What are the causes? For many people with this condition, the cause is not known. Raynaud phenomenon is sometimes associated with other diseases, such as scleroderma or lupus. What increases the risk? Raynaud phenomenon can affect anyone, but it develops most often in people who are 26-52 years old. It affects more females than males. What are the signs or symptoms? Symptoms of Raynaud phenomenon may occur when you are exposed to cold temperatures or when you have emotional stress. The symptoms may last for a few minutes or up to several hours. They usually affect your fingers but may also affect your toes, ears, or the tip of your nose. Symptoms may include:  Changes in skin color. The skin in the affected areas will turn pale or white. The skin may then change from white to bluish to red as normal blood flow returns to the area.  Numbness, tingling, or pain in the affected areas.  In severe cases, sores may develop in the affected areas. How is this diagnosed? Your health care provider  will do a physical exam and take your medical history. You may be asked to put your hands in cold water to check for a reaction to cold temperature. Blood tests may be done to check for other diseases or conditions. Your health care provider may also order a test to check the movement of blood through your arteries and veins (vascular ultrasound). How is this treated? Treatment often involves making lifestyle changes and taking steps to control your exposure to cold temperatures. For more severe cases, medicine (calcium channel blockers) may be used to improve blood flow. Surgery is sometimes done to block the nerves that control the affected arteries, but this is rare. Follow these instructions at home:  Avoid exposure to cold by taking these steps: ? If possible, stay indoors during cold weather. ? When you go outside during cold weather, dress in layers and wear mittens, a hat, a scarf, and warm footwear. ? Wear mittens or gloves when handling ice or frozen food. ? Use holders for glasses or cans containing cold drinks. ? Let warm water run for a while before taking a shower or bath. ? Warm up the car before driving in cold weather.  If possible, avoid stressful and emotional situations. Exercise, meditation, and yoga may help you cope with stress. Biofeedback may be useful.  Do not use any tobacco products, including cigarettes, chewing tobacco, or electronic cigarettes. If you need help quitting, ask your health care provider.  Avoid secondhand smoke.  Limit your use of  caffeine. Switch to decaffeinated coffee, tea, and soda. Avoid chocolate.  Wear loose fitting socks and comfortable, roomy shoes.  Avoid vibrating tools and machinery.  Take medicines only as directed by your health care provider. Contact a health care provider if:  Your discomfort becomes worse despite lifestyle changes.  You develop sores on your fingers or toes that do not heal.  Your fingers or toes turn  black.  You have breaks in the skin on your fingers or toes.  You have a fever.  You have pain or swelling in your joints.  You have a rash.  Your symptoms occur on only one side of your body. This information is not intended to replace advice given to you by your health care provider. Make sure you discuss any questions you have with your health care provider. Document Released: 08/21/2000 Document Revised: 01/30/2016 Document Reviewed: 02/26/2016 Elsevier Interactive Patient Education  2017 [employer]

## 2018-07-26 NOTE — Progress Notes (Addendum)
[name] is a 57 y.o. female who presents to [name] Medicine today for left thumb purple.  [name] denies any injury and is left-hand dominant.  She notes a day or 2 history of her left distal thumb becoming slightly darker in color and more purple/dusky appearance.  She notes this is occurred with a bit of pain.  It seems to be worse with activity and with cold.  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  She has not had any treatment yet.  She denies any change in medications.  She feels well otherwise with no fevers or chills.  No nausea vomiting or diarrhea.  No history of Raynaud syndrome.  No history of peripheral vascular disease.  She does have a history of cerebral aneurysm.  She does smoke.  She also has a history of hypertension which typically well controlled.   ROS as above:  Exam:  BP (!) 155/59   Pulse 77   Ht 5\' 4"  (1.626 m)   Wt 136 lb (61.7 kg)   BMI 23.34 kg/m  Wt Readings from Last 5 Encounters:  07/26/18 136 lb (61.7 kg)  06/27/18 133 lb (60.3 kg)  03/21/18 130 lb (59 kg)  08/10/17 130 lb (59 kg)  07/16/17 131 lb (59.4 kg)    Gen: Well NAD HEENT: EOMI,  MMM Lungs: Normal work of breathing. CTABL Heart: RRR no MRG Abd: NABS, Soft. Nondistended, Nontender Exts: Brisk capillary refill, warm and well perfused.  Left hand left thumb is very slightly darker than right thumb however hard to appreciate.  No other changes in hand present.  Capillary refill and sensation is intact.  Pulses are intact at the wrist bilaterally.  Allen's test is normal bilaterally.  Hand and thumb motion are intact.  Lab and Radiology Results Results for orders placed or performed in visit on 07/26/18 (from the past 72 hour(s))  CBC     Status: Abnormal   Collection Time: 07/26/18  3:39 PM  Result Value Ref Range   WBC 5.8 3.8 - 10.8 Thousand/uL   RBC 3.72 (L)  3.80 - 5.10 Million/uL   Hemoglobin 12.7 11.7 - 15.5 g/dL   HCT 16.1 09.6 - 04.5 %   MCV 97.6 80.0 - 100.0 fL   MCH 34.1 (H) 27.0 - 33.0 pg   MCHC 35.0 32.0 - 36.0 g/dL   RDW 40.9 81.1 - 91.4 %   Platelets 374 140 - 400 Thousand/uL   MPV 8.9 7.5 - 12.5 fL  COMPLETE METABOLIC PANEL WITH GFR     Status: Abnormal   Collection Time: 07/26/18  3:39 PM  Result Value Ref Range   Glucose, Bld 63 (L) 65 - 99 mg/dL    Comment: .            Fasting reference interval .    BUN 14 7 - 25 mg/dL   Creat 7.82 9.56 - 2.13 mg/dL    Comment: For patients >32 years of age, the reference limit for Creatinine is approximately 13% higher for people identified as African-American. .    GFR, Est Non African American 67 > OR = 60 mL/min/1.69m2   GFR, Est African American 78 > OR = 60 mL/min/1.14m2   BUN/Creatinine Ratio NOT APPLICABLE 6 - 22 (calc)   Sodium 138 135 - 146 mmol/L   Potassium 5.1 3.5 - 5.3 mmol/L   Chloride 99 98 - 110 mmol/L   CO2 30 20 -  32 mmol/L   Calcium 9.6 8.6 - 10.4 mg/dL   Total Protein 6.8 6.1 - 8.1 g/dL   Albumin 4.7 3.6 - 5.1 g/dL   Globulin 2.1 1.9 - 3.7 g/dL (calc)   AG Ratio 2.2 1.0 - 2.5 (calc)   Total Bilirubin 0.3 0.2 - 1.2 mg/dL   Alkaline phosphatase (APISO) 76 33 - 130 U/L   AST 16 10 - 35 U/L   ALT 11 6 - 29 U/L  CK     Status: None   Collection Time: 07/26/18  3:39 PM  Result Value Ref Range   Total CK 74 29 - 143 U/L  Sedimentation rate     Status: None   Collection Time: 07/26/18  3:39 PM  Result Value Ref Range   Sed Rate 6 0 - 30 mm/h   No results found.    Assessment and Plan: 57 y.o. female with thumb pain unclear etiology.  I am somewhat concerned for Raynaud.  Will complete limited metabolic work-up listed below as well as arterial Doppler assessment of upper extremity.  If worsening patient will notify me will check back sooner.  Addendum: Neurosurgery notes received.  As described above.  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  Note will be sent to scan.   Orders Placed This Encounter  Procedures  . CBC  . COMPLETE METABOLIC PANEL WITH GFR  . ANA  . CK  . Sedimentation rate   No orders of the defined types were placed in this encounter.    Historical information moved to improve visibility of documentation.  Past Medical History:  Diagnosis Date  . Brain aneurysm   . COPD (chronic obstructive pulmonary disease) (HCC) 09/05/2014   Per notes from ED.    . DDD (degenerative disc disease), lumbar 11/06/2014  . Diplopia 09/05/2014   Resolved. Dr. Doristine SectionSubramanian.    . Former smoker 09/03/2014   Quit date 08/13/14   . Hyperlipidemia 09/05/2014  . Hypertension   . Non-traumatic intracranial subarachnoid hemorrhage (HCC)   . 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 Doristine SectionSubramanian. Given contacts and glasses. Follow up yearly.    . Primary osteoarthritis of both hips 11/07/2014   Past Surgical History:  Procedure Laterality Date  . BRAIN SURGERY    . CARDIAC CATHETERIZATION N/A 07/12/2015   Procedure: Left Heart Cath and Coronary Angiography;  Surgeon: Corky CraftsJayadeep S Varanasi, MD;  [facility];  Service: Cardiovascular;  Laterality: N/A;  . CESAREAN SECTION N/A   . IR GENERIC HISTORICAL  10/16/2016   IR ANGIO INTRA EXTRACRAN SEL INTERNAL CAROTID BILAT MOD SED 10/16/2016 Lisbeth RenshawNeelesh Nundkumar, MD [facility]-INTER[facility] RAD  . IR GENERIC HISTORICAL  10/16/2016   IR ANGIO VERTEBRAL SEL VERTEBRAL BILAT MOD SED 10/16/2016 Lisbeth RenshawNeelesh Nundkumar, MD [facility]  . KIDNEY SURGERY Left    stent  . RADIOLOGY WITH ANESTHESIA N/A 08/14/2014   Procedure: Embolization   (RADIOLOGY WITH ANESTHESIA) ;  Surgeon: Medication Radiologist, MD;  [facility];  Service: Radiology;  Laterality: N/A;   Social History   Tobacco Use  . Smoking status: Light Tobacco Smoker    Packs/day: 0.25  . Smokeless tobacco: Never Used  Substance Use Topics  . Alcohol  use: Yes    Alcohol/week: 0.0 standard drinks    Comment: 2/3 drinks per day   family history includes Alcohol abuse in her father and mother; Aneurysm in her sister and sister; Breast cancer in her  mother; Cerebral aneurysm in her father; Hyperlipidemia in her mother and sister; Hypertension in her mother and sister; Stroke in her father.  Medications: Current Outpatient Medications  Medication Sig Dispense Refill  . acetaminophen (TYLENOL) 500 MG tablet Take 1,000 mg by mouth every 6 (six) hours as needed for mild pain (pain).     . Cholecalciferol (VITAMIN D3) 2000 units TABS Take 2,000 Units by mouth daily.    . DiphenhydrAMINE HCl (BENADRYL PO) Take 1 tablet by mouth at bedtime as needed (SLEEP).    . hydrochlorothiazide (HYDRODIURIL) 25 MG tablet Take 1 tablet (25 mg total) by mouth daily. 90 tablet 3  . losartan (COZAAR) 100 MG tablet Take 1 tablet (100 mg total) by mouth daily. 90 tablet 3  . Multiple Vitamin (MULTIVITAMIN WITH MINERALS) TABS tablet Take 1 tablet by mouth daily. Centrum Silver    . nitroGLYCERIN (NITROSTAT) 0.3 MG SL tablet Place 1 tablet (0.3 mg total) under the tongue every 5 (five) minutes as needed for chest pain. 90 tablet 12  . omeprazole (PRILOSEC) 40 MG capsule TAKE 1 CAPSULE (40 MG TOTAL) BY MOUTH DAILY. 90 capsule 1   No current facility-administered medications for this visit.    Allergies  Allergen Reactions  . Aspirin Hives  . Ibuprofen Hives  . Iodine Hives  . Oysters [Shellfish Allergy] Hives and Nausea And Vomiting    Reaction to oysters, clams and crab legs     Discussed warning signs or symptoms. Please see discharge instructions. Patient expresses understanding.

## 2018-07-27 LAB — CBC
HEMATOCRIT: 36.3 % (ref 35.0–45.0)
HEMOGLOBIN: 12.7 g/dL (ref 11.7–15.5)
MCH: 34.1 pg — ABNORMAL HIGH (ref 27.0–33.0)
MCHC: 35 g/dL (ref 32.0–36.0)
MCV: 97.6 fL (ref 80.0–100.0)
MPV: 8.9 fL (ref 7.5–12.5)
Platelets: 374 10*3/uL (ref 140–400)
RBC: 3.72 10*6/uL — AB (ref 3.80–5.10)
RDW: 11.8 % (ref 11.0–15.0)
WBC: 5.8 10*3/uL (ref 3.8–10.8)

## 2018-07-27 LAB — COMPLETE METABOLIC PANEL WITH GFR
AG Ratio: 2.2 (calc) (ref 1.0–2.5)
ALKALINE PHOSPHATASE (APISO): 76 U/L (ref 33–130)
ALT: 11 U/L (ref 6–29)
AST: 16 U/L (ref 10–35)
Albumin: 4.7 g/dL (ref 3.6–5.1)
BILIRUBIN TOTAL: 0.3 mg/dL (ref 0.2–1.2)
BUN: 14 mg/dL (ref 7–25)
CO2: 30 mmol/L (ref 20–32)
Calcium: 9.6 mg/dL (ref 8.6–10.4)
Chloride: 99 mmol/L (ref 98–110)
Creat: 0.93 mg/dL (ref 0.50–1.05)
GFR, EST AFRICAN AMERICAN: 78 mL/min/{1.73_m2} (ref >=60)
GFR, EST NON AFRICAN AMERICAN: 67 mL/min/{1.73_m2} (ref >=60)
GLUCOSE: 63 mg/dL — AB (ref 65–99)
Globulin: 2.1 g/dL (calc) (ref 1.9–3.7)
POTASSIUM: 5.1 mmol/L (ref 3.5–5.3)
Sodium: 138 mmol/L (ref 135–146)
Total Protein: 6.8 g/dL (ref 6.1–8.1)

## 2018-07-27 LAB — CK: Total CK: 74 U/L (ref 29–143)

## 2018-07-27 LAB — ANA: Anti Nuclear Antibody(ANA): NEGATIVE

## 2018-07-27 LAB — SEDIMENTATION RATE: SED RATE: 6 mm/h (ref 0–30)

## 2018-07-28 ENCOUNTER — Other Ambulatory Visit (HOSPITAL_COMMUNITY): Payer: Self-pay | Admitting: Family Medicine

## 2018-07-28 ENCOUNTER — Other Ambulatory Visit: Payer: Self-pay | Admitting: Family Medicine

## 2018-07-28 ENCOUNTER — Ambulatory Visit (HOSPITAL_COMMUNITY)
Admission: RE | Admit: 2018-07-28 | Discharge: 2018-07-28 | Disposition: A | Discharge status: Home / Self Care | Payer: Commercial Managed Care - PPO | Source: Ambulatory Visit | Attending: Family Medicine | Admitting: Family Medicine

## 2018-07-28 DIAGNOSIS — M79645 Pain in left finger(s): Secondary | ICD-10-CM | POA: Diagnosis present

## 2018-07-28 DIAGNOSIS — I7300 Raynaud's syndrome without gangrene: Secondary | ICD-10-CM

## 2018-09-20 ENCOUNTER — Other Ambulatory Visit: Payer: Self-pay | Admitting: Family Medicine

## 2018-11-20 IMAGING — XA IR ANGIO VETEBRAL SEL VERTEBRAL BILAT MOD SED
6 series · 13 of 24 positions shown · IV contrast (IODINE)
Comparison: none

PROCEDURE:
DIAGNOSTIC CEREBRAL ANGIOGRAM
HISTORY: The patient is a 55-year-old woman with a history of subarachnoid
hemorrhage and coiling of a ruptured right picas aneurysm
approximately 2 years ago. She has made an excellent recovery, and
presents for long-term angiographic follow-up.
TECHNIQUE: CATHETERS AND WIRES
5-French JB-1 glidecatheter

[Series 1: carotid care 2 · 2 acquisitions, 2 frames shown (1 of 5)]
[im 1/2]
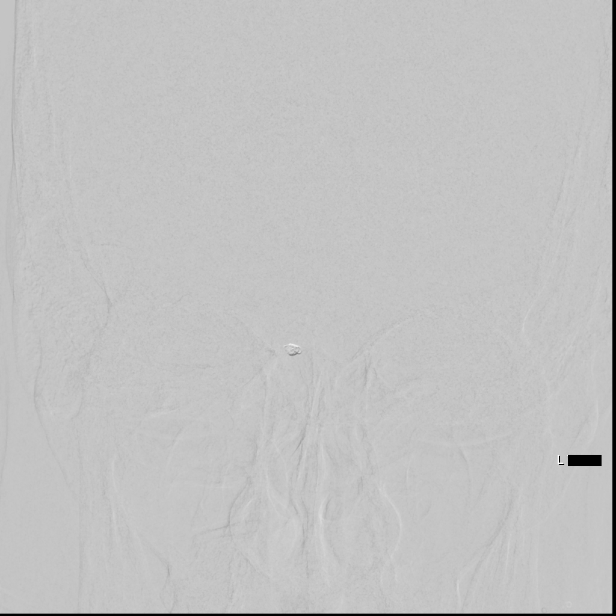
[im 2/2]
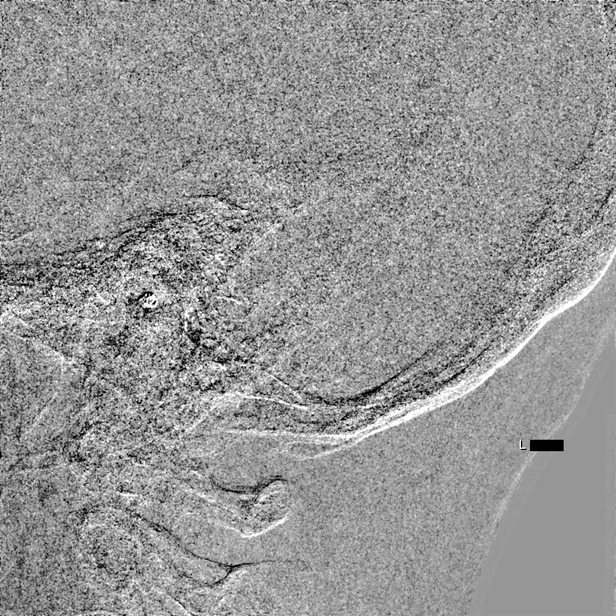

[Series 2: carotid care 2 · 2 acquisitions, 2 frames shown (2 of 5)]
[im 1/2]
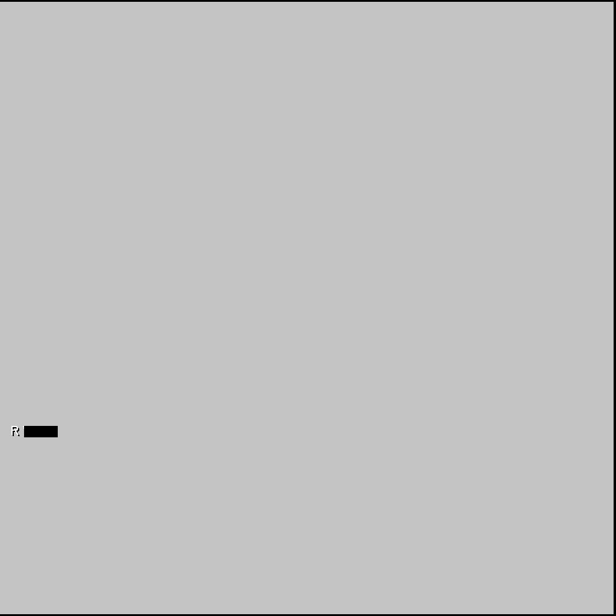
[im 2/2]
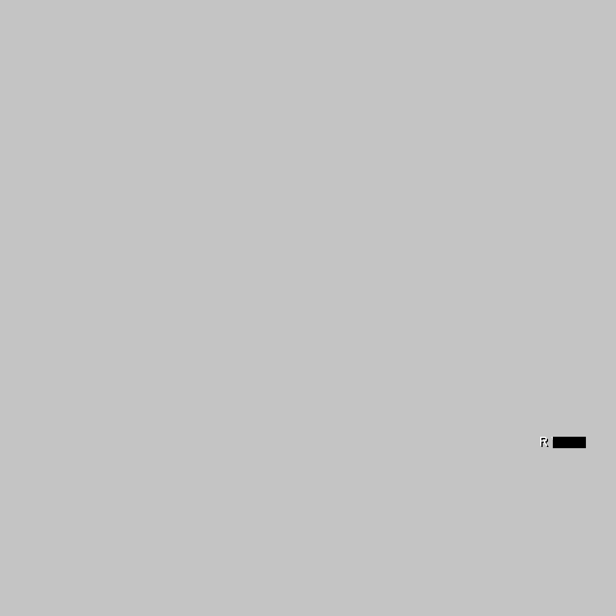

[Series 3: carotid care 2 · 2 acquisitions, 2 frames shown (3 of 5)]
[im 1/2]
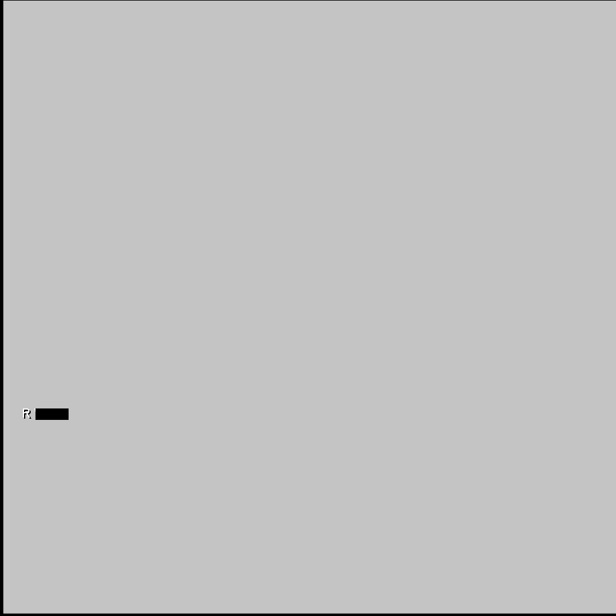
[im 2/2]
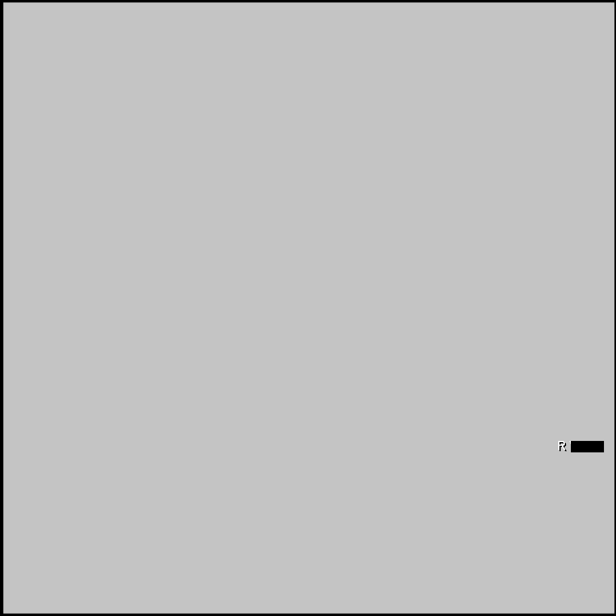

[Series 4: carotid care 2 · 2 acquisitions, 2 frames shown (4 of 5)]
[im 1/2]
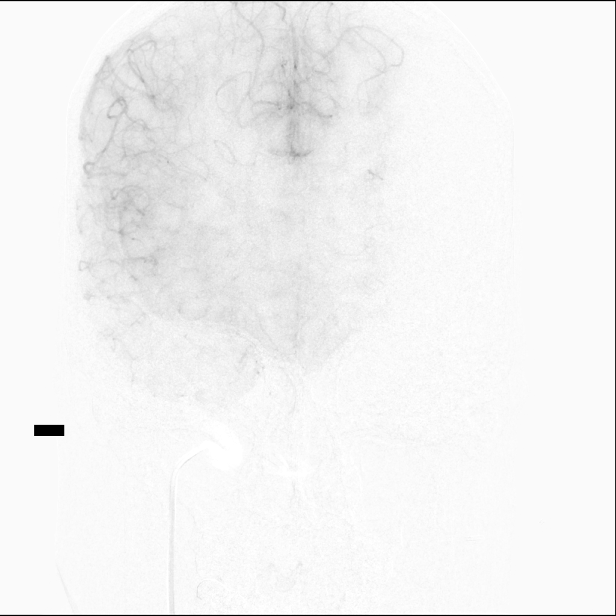
[im 2/2  full-range]
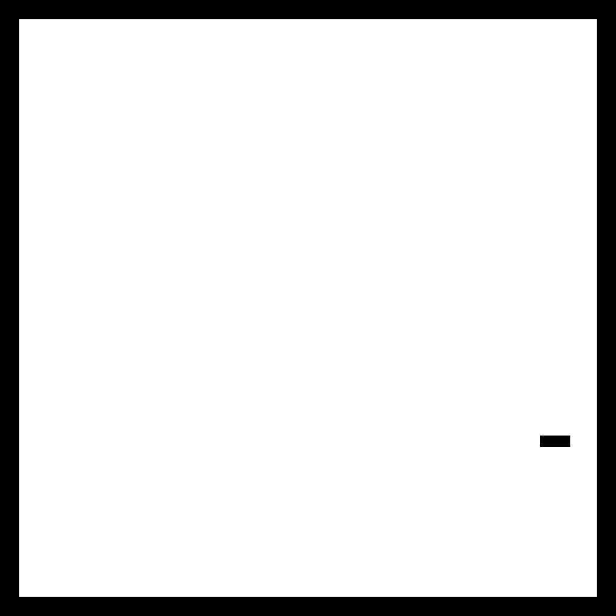

[Series 5: carotid care 2 · 2 acquisitions, 2 frames shown (5 of 5)]
[im 1/2]
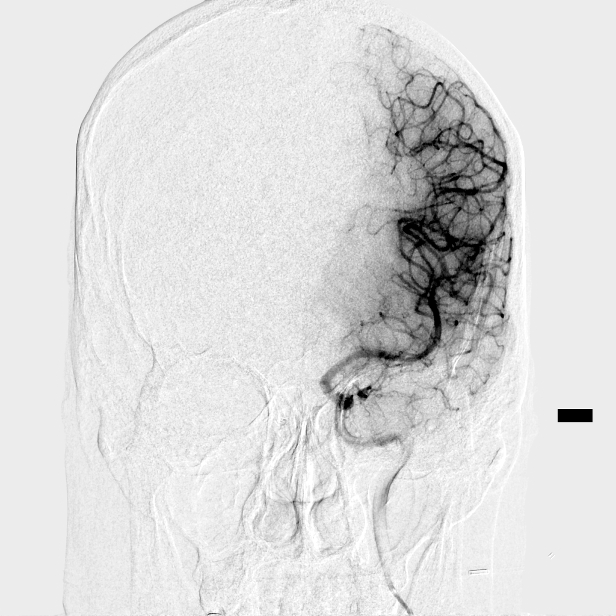
[im 2/2]
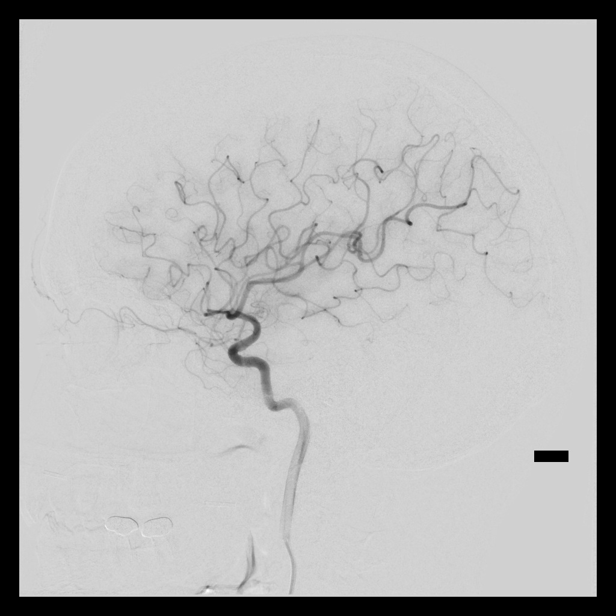

[Series 300: dr. (person_name) · 3 of 10 slices shown]
[im 2/10]
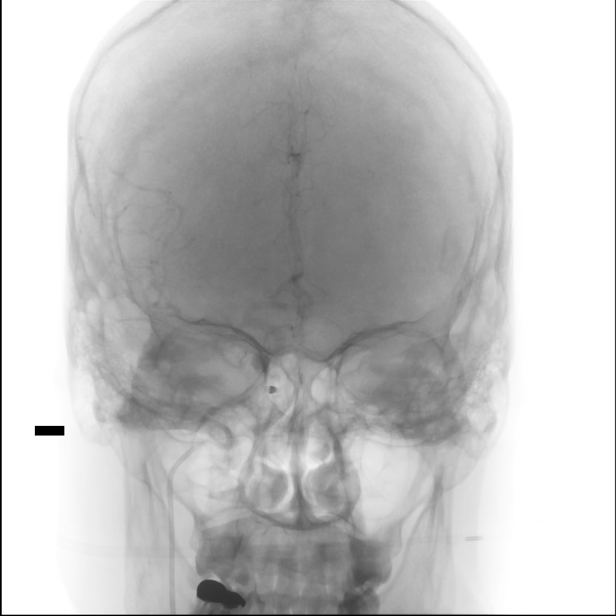
[im 6/10]
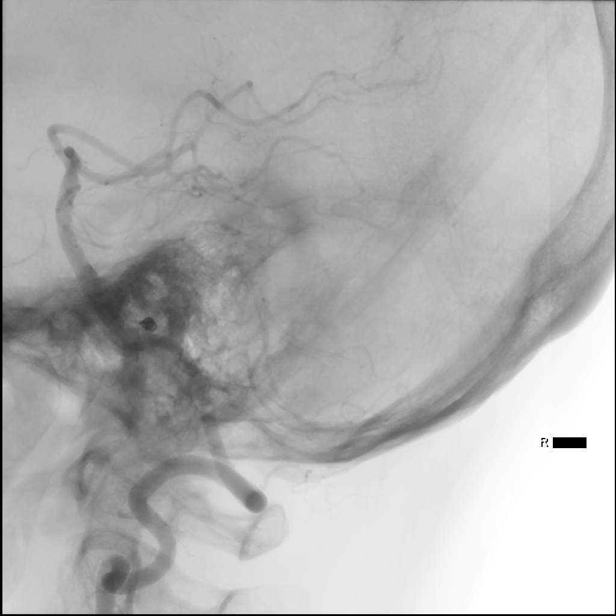
[im 10/10]
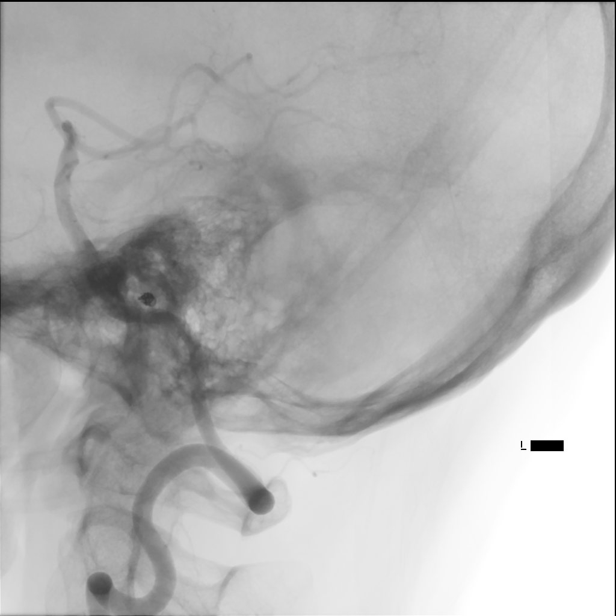

[13 of 24 positions shown; findings below may reference images not displayed]

ACCESS:
The technical aspects of the procedure as well as its potential
risks and benefits were reviewed with the patient. These risks
included but were not limited bleeding, infection, allergic
reaction, damage to organs/vital structures, stroke, non-diagnostic
procedure, and the catastrophic outcomes of heart attack, coma, and
death. With an understanding of these risks, informed consent was
obtained and witnessed. The patient was placed in the supine
position on the angiography table and the skin of right groin
prepped in the usual sterile fashion. The procedure was performed
under local anesthesia (1%-solution of bicarbonate-bufferred
Lidocaine) and conscious sedation with Versed and fentanyl monitored
by the in-suite nurse. A 5- French sheath was introduced in the
right common femoral artery using Seldinger technique. A fluorophase
sequence was used to document the sheath position.

MEDICATIONS:
HEPARIN: 6666 units total.

CONTRAST:  cc, Omnipaque 300

FLUOROSCOPY TIME:  FLUOROSCOPY TIME: 2.5 combined AP and lateral
minutes
0.035" glidewire

VESSELS CATHETERIZED
Right internal carotid

Left internal carotid

Left vertebral

Right vertebral

Right common femoral

VESSELS STUDIED
Right internal carotid, head

Left internal carotid, head

Left vertebral

Right vertebral

Right femoral

PROCEDURAL NARRATIVE
A 5-Fr JB-1 terumo glide catheter was advanced over a
glidewire into the aortic arch. The above vessels were then
sequentially catheterized and cervical/cerebral angiograms taken.
After review of images, the catheter was removed without incident.
FINDINGS: Right internal carotid: head:

Injection reveals the presence of a widely patent ICA, M1, and A1
segments and their branches. Note is made of a patent anterior
communicating artery, with filling of bilateral distal ACA branches
through this right-sided injection. The parenchymal and venous
phases are normal. The venous sinuses are widely patent.

Left internal carotid: head:

Injection reveals the presence of a widely patent ICA, and M1
segments and their branches, while the right A1 is hypoplastic.
There is no significant stenosis, occlusion, aneurysm, or high flow
vascular malformation visualized. The parenchymal and venous phases
are normal. The venous sinuses are widely patent.

Left vertebral:

Injection reveals the presence of a widely patent vertebral artery.
This leads to a widely patent basilar artery that terminates in
bilateral P1. The basilar apex is normal. There is no significant
stenosis, occlusion, aneurysm, or vascular malformation visualized.
The parenchymal and venous phases are normal. The venous sinuses are
widely patent.

Right vertebral:

The vertebral artery is widely patent. Coil mass is seen at the
origin of the right PICA. There does not appear to be any coil
compaction, or aneurysm recurrence. See basilar description above.

Right femoral:

Normal vessel. No significant atherosclerotic disease. Arterial
sheath in adequate position.

DISPOSITION:
Upon completion of the study, the femoral sheath was removed and
hemostasis obtained using a 5-Fr ExoSeal closure device. Good
proximal and distal lower extremity pulses were documented upon
achievement of hemostasis. The procedure was well tolerated and no
early complications were observed. The patient was transferred back
to the holding area to be positioned flat in bed for 3 hours of
observation.
IMPRESSION: 1. Persistent occlusion of a small right PICA aneurysm 2 years after
coiling. No aneurysm residual or recurrence is seen.

The preliminary results of this procedure were shared with the
patient and the patient's family.

## 2019-01-18 ENCOUNTER — Ambulatory Visit: Payer: Commercial Managed Care - PPO | Admitting: Family Medicine

## 2019-03-20 ENCOUNTER — Other Ambulatory Visit: Payer: Self-pay | Admitting: Family Medicine

## 2019-03-21 ENCOUNTER — Ambulatory Visit: Payer: Commercial Managed Care - PPO | Admitting: Family Medicine

## 2019-03-21 ENCOUNTER — Other Ambulatory Visit: Payer: Self-pay

## 2019-03-21 ENCOUNTER — Encounter: Payer: Self-pay | Admitting: Family Medicine

## 2019-03-21 VITALS — BP 116/73 | HR 66 | Temp 98.1°F | Ht 64.0 in | Wt 136.0 lb

## 2019-03-21 DIAGNOSIS — E782 Mixed hyperlipidemia: Secondary | ICD-10-CM | POA: Diagnosis not present

## 2019-03-21 DIAGNOSIS — J449 Chronic obstructive pulmonary disease, unspecified: Secondary | ICD-10-CM

## 2019-03-21 DIAGNOSIS — Z1211 Encounter for screening for malignant neoplasm of colon: Secondary | ICD-10-CM

## 2019-03-21 DIAGNOSIS — Z87891 Personal history of nicotine dependence: Secondary | ICD-10-CM | POA: Diagnosis not present

## 2019-03-21 DIAGNOSIS — Z8679 Personal history of other diseases of the circulatory system: Secondary | ICD-10-CM

## 2019-03-21 DIAGNOSIS — Z1239 Encounter for other screening for malignant neoplasm of breast: Secondary | ICD-10-CM

## 2019-03-21 DIAGNOSIS — H21239 Degeneration of iris (pigmentary), unspecified eye: Secondary | ICD-10-CM

## 2019-03-21 DIAGNOSIS — I10 Essential (primary) hypertension: Secondary | ICD-10-CM

## 2019-03-21 DIAGNOSIS — Z6823 Body mass index (BMI) 23.0-23.9, adult: Secondary | ICD-10-CM

## 2019-03-21 LAB — COMPLETE METABOLIC PANEL WITH GFR
AG Ratio: 1.8 (calc) (ref 1.0–2.5)
ALT: 12 U/L (ref 6–29)
AST: 19 U/L (ref 10–35)
Albumin: 4.6 g/dL (ref 3.6–5.1)
Alkaline phosphatase (APISO): 82 U/L (ref 37–153)
BUN: 13 mg/dL (ref 7–25)
CO2: 29 mmol/L (ref 20–32)
Calcium: 9.9 mg/dL (ref 8.6–10.4)
Chloride: 95 mmol/L — ABNORMAL LOW (ref 98–110)
Creat: 0.88 mg/dL (ref 0.50–1.05)
GFR, Est African American: 83 mL/min/{1.73_m2} (ref >=60)
GFR, Est Non African American: 72 mL/min/{1.73_m2} (ref >=60)
Globulin: 2.6 g/dL (calc) (ref 1.9–3.7)
Glucose, Bld: 96 mg/dL (ref 65–99)
Potassium: 4.7 mmol/L (ref 3.5–5.3)
Sodium: 135 mmol/L (ref 135–146)
Total Bilirubin: 0.5 mg/dL (ref 0.2–1.2)
Total Protein: 7.2 g/dL (ref 6.1–8.1)

## 2019-03-21 LAB — CBC
HCT: 37.5 % (ref 35.0–45.0)
Hemoglobin: 13.2 g/dL (ref 11.7–15.5)
MCH: 34.5 pg — ABNORMAL HIGH (ref 27.0–33.0)
MCHC: 35.2 g/dL (ref 32.0–36.0)
MCV: 97.9 fL (ref 80.0–100.0)
MPV: 9 fL (ref 7.5–12.5)
Platelets: 369 10*3/uL (ref 140–400)
RBC: 3.83 10*6/uL (ref 3.80–5.10)
RDW: 11.7 % (ref 11.0–15.0)
WBC: 5.6 10*3/uL (ref 3.8–10.8)

## 2019-03-21 LAB — LIPID PANEL W/REFLEX DIRECT LDL
Cholesterol: 208 mg/dL — ABNORMAL HIGH (ref <200)
HDL: 84 mg/dL (ref >=50)
LDL Cholesterol (Calc): 103 mg/dL (calc) — ABNORMAL HIGH
Non-HDL Cholesterol (Calc): 124 mg/dL (calc) (ref <130)
Total CHOL/HDL Ratio: 2.5 (calc) (ref <5.0)
Triglycerides: 112 mg/dL (ref <150)

## 2019-03-21 MED ORDER — OMEPRAZOLE 40 MG PO CPDR: 40.0000 mg | DELAYED_RELEASE_CAPSULE | Freq: Every day | ORAL | 3 refills | Status: DC

## 2019-03-21 MED ORDER — LOSARTAN POTASSIUM 100 MG PO TABS: 100.0000 mg | ORAL_TABLET | Freq: Every day | ORAL | 3 refills | Status: DC

## 2019-03-21 MED ORDER — HYDROCHLOROTHIAZIDE 25 MG PO TABS: 25.0000 mg | ORAL_TABLET | Freq: Every day | ORAL | 3 refills | Status: DC

## 2019-03-21 NOTE — Patient Instructions (Addendum)
Thank you for coming in today. Get labs and mammogram soon.  Talk with radiology on your way out today.  I recommend you establish care with doctor at the [facility] where you will be living.  Please return cologuard kit.   Recheck yearly if all is well.   Cologuard is covered by Medicare and most major insurers. INSURANCE Cologuard is covered by Medicare and Medicare Advantage with no co-pay or deductible for eligible patients.  More than 92%* of all Cologuard patients have no out of-pocket cost for screening.  Based on the [place] should be covered by most private insurers with no co-pay or deductible for eligible patients (ages 27-75; at average risk for colon cancer; without symptoms). Some exceptions may apply, so we recommend patients call their insurer to confirm.  BILLING Call Cologuard at [phone] and speak with a member of our Patient Support Team to make sure the test will be covered.

## 2019-03-21 NOTE — Progress Notes (Signed)
[name] is a 58 y.o. female who presents to [facility] [place] today for follow-up hypertension GERD.  [name] is doing well.  She retired from [employer] a few days ago.  She is planning on selling her house and moving to [place].  She is thinking about moving to [place] or [place].  She is very happy with how things are going.  Hypertension: Taking hydrochlorothiazide and losartan.  No chest pain palpitation shortness of breath lightheadedness or dizziness.  GERD: Doing well with omeprazole.  History of ruptured aneurysm with subarachnoid hemorrhage.  Clinically doing well on recent follow-ups.    ROS as above:  Exam:  BP 116/73   Pulse 66   Temp 98.1 F (36.7 C) (Oral)   Ht 5\' 4"  (1.626 m)   Wt 136 lb (61.7 kg)   BMI 23.34 kg/m  Wt Readings from Last 5 Encounters:  03/21/19 136 lb (61.7 kg)  07/26/18 136 lb (61.7 kg)  06/27/18 133 lb (60.3 kg)  03/21/18 130 lb (59 kg)  08/10/17 130 lb (59 kg)    Gen: Well NAD HEENT: EOMI,  MMM Lungs: Normal work of breathing. CTABL Heart: RRR no MRG Abd: NABS, Soft. Nondistended, Nontender Exts: Brisk capillary refill, warm and well perfused.   Lab and Radiology Results No results found for this or any previous visit (from the past 72 hour(s)). No results found.    Assessment and Plan: 58 y.o. female with  Hypertension well controlled.  Plan for basic fasting labs listed below.  GERD: Doing well refill omeprazole.   Breast cancer screening: Due for mammogram.  Mammogram ordered.  Colon cancer screening: Plan to proceed after discussion with Cologuard.  Aneurysm: We will likely need continuing follow-up with neurosurgery closer to where she will be moving.  Recheck yearly with me or follow-up with new medical provider where she moves.  PDMP not reviewed this encounter. Orders  Placed This Encounter  Procedures  . MM 3D SCREEN BREAST BILATERAL    Standing Status:   Future    Standing Expiration Date:   05/21/2020    Order Specific Question:   Reason for Exam (SYMPTOM  OR DIAGNOSIS REQUIRED)    Answer:   screen breast cancer    Order Specific Question:   Is the patient pregnant?    Answer:   No    Order Specific Question:   Preferred imaging location?    Answer:   [facility]  . CBC  . COMPLETE METABOLIC PANEL WITH GFR  . Lipid Panel w/reflex Direct LDL  . Cologuard   Meds ordered this encounter  Medications  . hydrochlorothiazide (HYDRODIURIL) 25 MG tablet    Sig: Take 1 tablet (25 mg total) by mouth daily.    Dispense:  90 tablet    Refill:  3  . losartan (COZAAR) 100 MG tablet    Sig: Take 1 tablet (100 mg total) by mouth daily.    Dispense:  90 tablet    Refill:  3  . omeprazole (PRILOSEC) 40 MG capsule    Sig: Take 1 capsule (40 mg total) by mouth daily.    Dispense:  90 capsule    Refill:  3     Historical information moved to improve visibility of documentation.  Past Medical History:  Diagnosis Date  . Brain aneurysm   . COPD (chronic obstructive pulmonary disease) ([place]) 09/05/2014   Per notes from ED.    [facility]  DDD (degenerative disc disease), lumbar 11/06/2014  . Diplopia 09/05/2014   Resolved. Dr. Doristine Porter.    . Former smoker 09/03/2014   Quit date 08/13/14   . Hyperlipidemia 09/05/2014  . Hypertension   . Non-traumatic intracranial subarachnoid hemorrhage (HCC)   . 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 Doristine Porter. Given contacts and glasses. Follow up yearly.    . Primary osteoarthritis of both hips 11/07/2014   Past Surgical History:  Procedure Laterality Date  . BRAIN SURGERY    . CARDIAC CATHETERIZATION N/A 07/12/2015   Procedure: Left Heart Cath and Coronary Angiography;  Surgeon: Gabrielle CraftsJayadeep S Varanasi, MD;  [facility];  Service: Cardiovascular;  Laterality: N/A;  . CESAREAN SECTION N/A    . IR GENERIC HISTORICAL  10/16/2016   IR ANGIO INTRA EXTRACRAN SEL INTERNAL CAROTID BILAT MOD SED 10/16/2016 Gabrielle RenshawNeelesh Nundkumar, MD [facility]  . IR GENERIC HISTORICAL  10/16/2016   IR ANGIO VERTEBRAL SEL VERTEBRAL BILAT MOD SED 10/16/2016 Gabrielle RenshawNeelesh Nundkumar, MD [facility]  . KIDNEY SURGERY Left    stent  . RADIOLOGY WITH ANESTHESIA N/A 08/14/2014   Procedure: Embolization   (RADIOLOGY WITH ANESTHESIA) ;  Surgeon: Medication Radiologist, MD;  Location[facility];  Service: Radiology;  Laterality: N/A;   Social History   Tobacco Use  . Smoking status: Former Smoker    Packs/day: 0.25  . Smokeless tobacco: Never Used  Substance Use Topics  . Alcohol use: Yes    Alcohol/week: 0.0 standard drinks    Comment: 2/3 drinks per day   family history includes Alcohol abuse in her father and mother; Aneurysm in her sister and sister; Breast cancer in her mother; Cerebral aneurysm in her father; Hyperlipidemia in her mother and sister; Hypertension in her mother and sister; Stroke in her father.  Medications: Current Outpatient Medications  Medication Sig Dispense Refill  . acetaminophen (TYLENOL) 500 MG tablet Take 1,000 mg by mouth every 6 (six) hours as needed for mild pain (pain).     . Cholecalciferol (VITAMIN D3) 2000 units TABS Take 2,000 Units by mouth daily.    . DiphenhydrAMINE HCl (BENADRYL PO) Take 1 tablet by mouth at bedtime as needed (SLEEP).    . hydrochlorothiazide (HYDRODIURIL) 25 MG tablet Take 1 tablet (25 mg total) by mouth daily. 90 tablet 3  . losartan (COZAAR) 100 MG tablet Take 1 tablet (100 mg total) by mouth daily. 90 tablet 3  . Multiple Vitamin (MULTIVITAMIN WITH MINERALS) TABS tablet Take 1 tablet by mouth daily. Centrum Silver    . nitroGLYCERIN (NITROSTAT) 0.3 MG SL tablet Place 1 tablet (0.3 mg total) under the tongue every 5 (five) minutes as needed for chest pain. 90 tablet 12  . omeprazole (PRILOSEC) 40 MG capsule Take 1 capsule (40 mg total) by mouth daily. 90 capsule  3   No current facility-administered medications for this visit.    Allergies  Allergen Reactions  . Aspirin Hives  . Ibuprofen Hives  . Iodine Hives  . Oysters [Shellfish Allergy] Hives and Nausea And Vomiting    Reaction to oysters, clams and crab legs     Discussed warning signs or symptoms. Please see discharge instructions. Patient expresses understanding.

## 2019-08-20 IMAGING — DX DG FOOT COMPLETE 3+V*L*
3 series · 3 of 3 positions shown · non-contrast
Comparison: None.

CLINICAL DATA: Pain for several days

EXAM:
LEFT FOOT - COMPLETE 3+ VIEW

[foot ap]
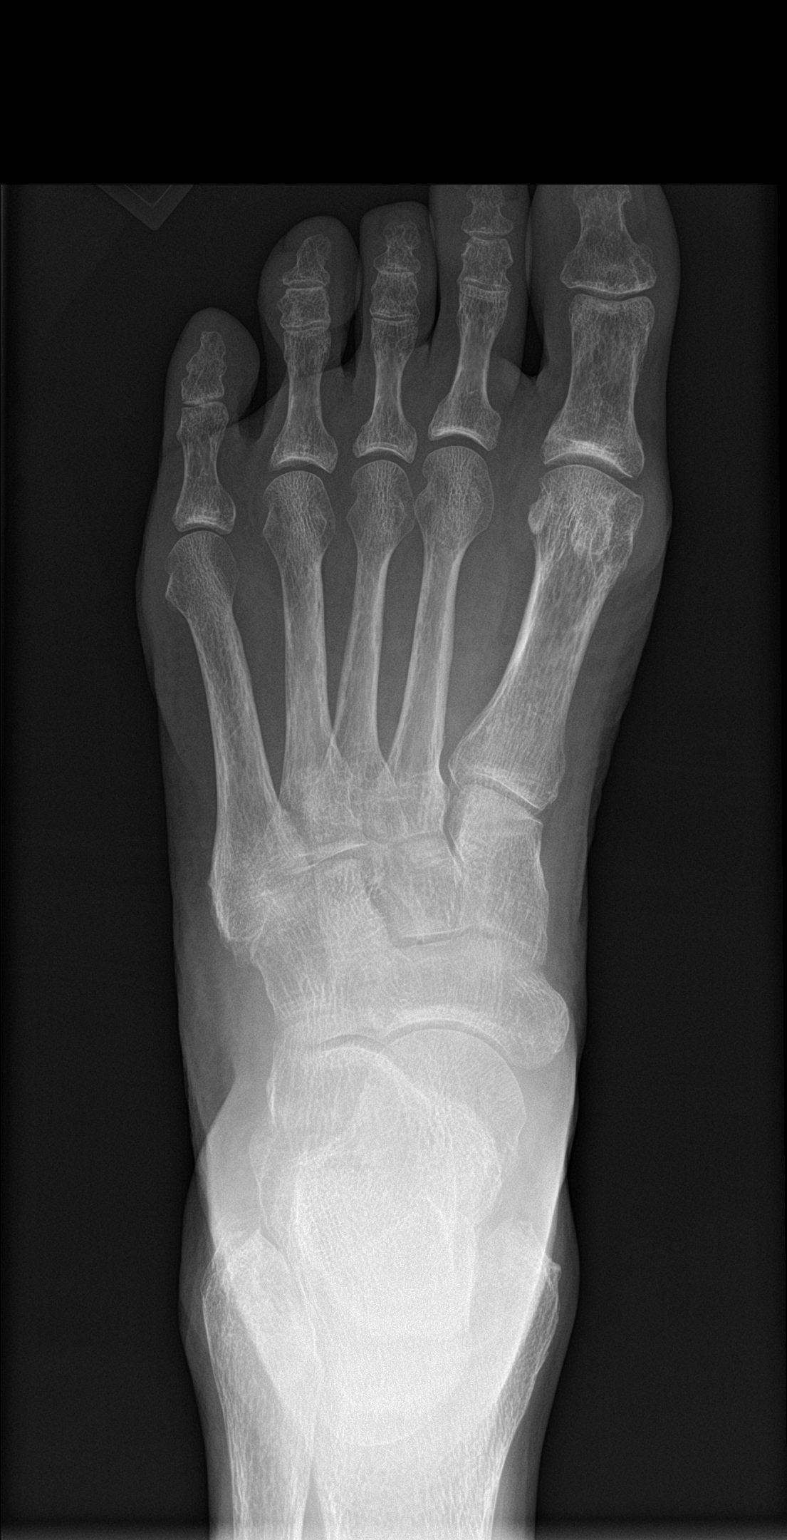

[foot obl]
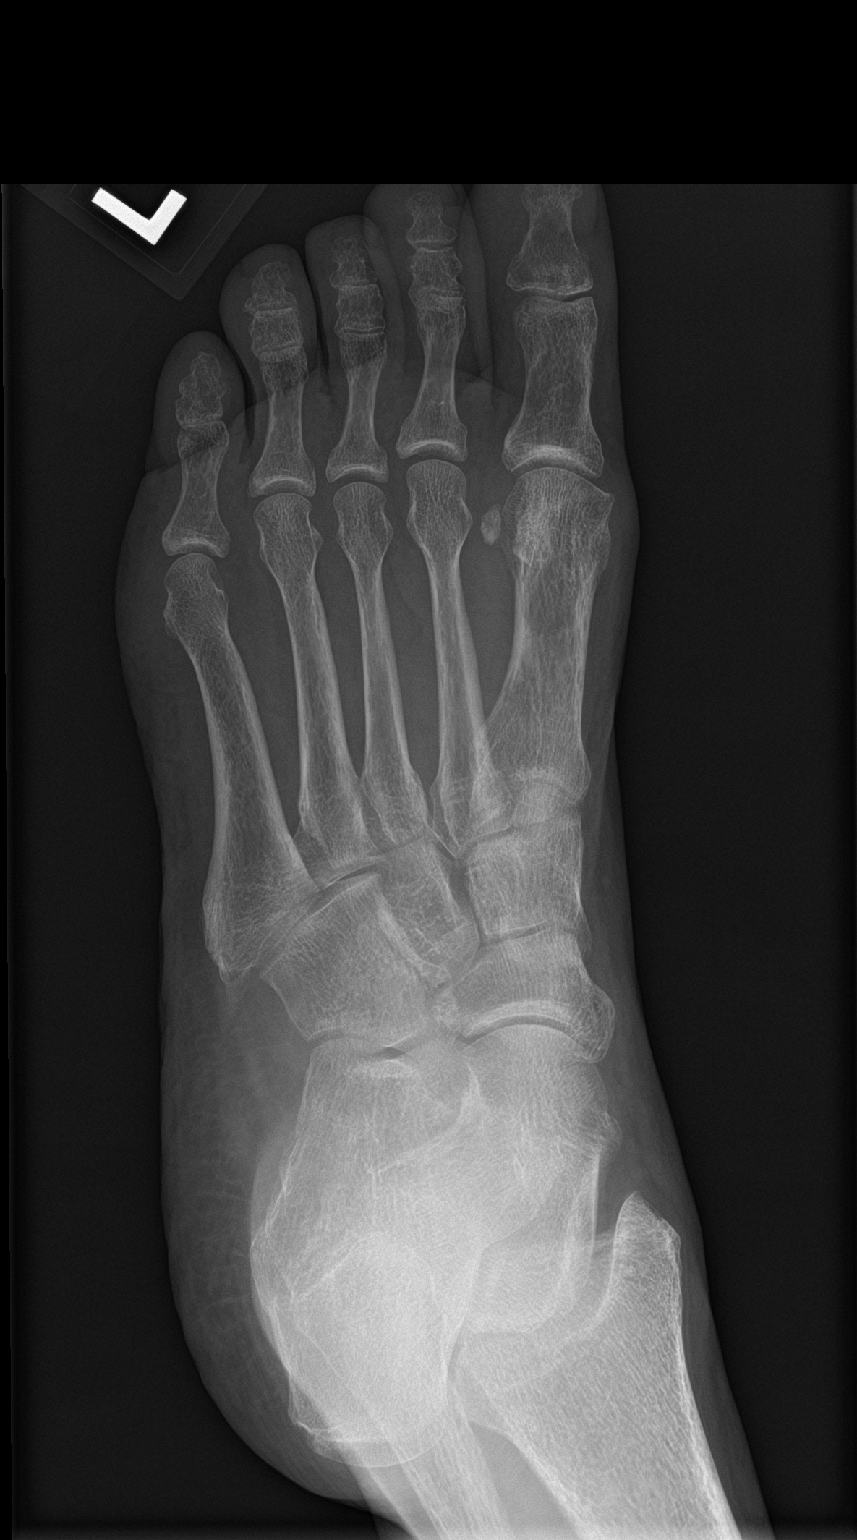

[foot lat]
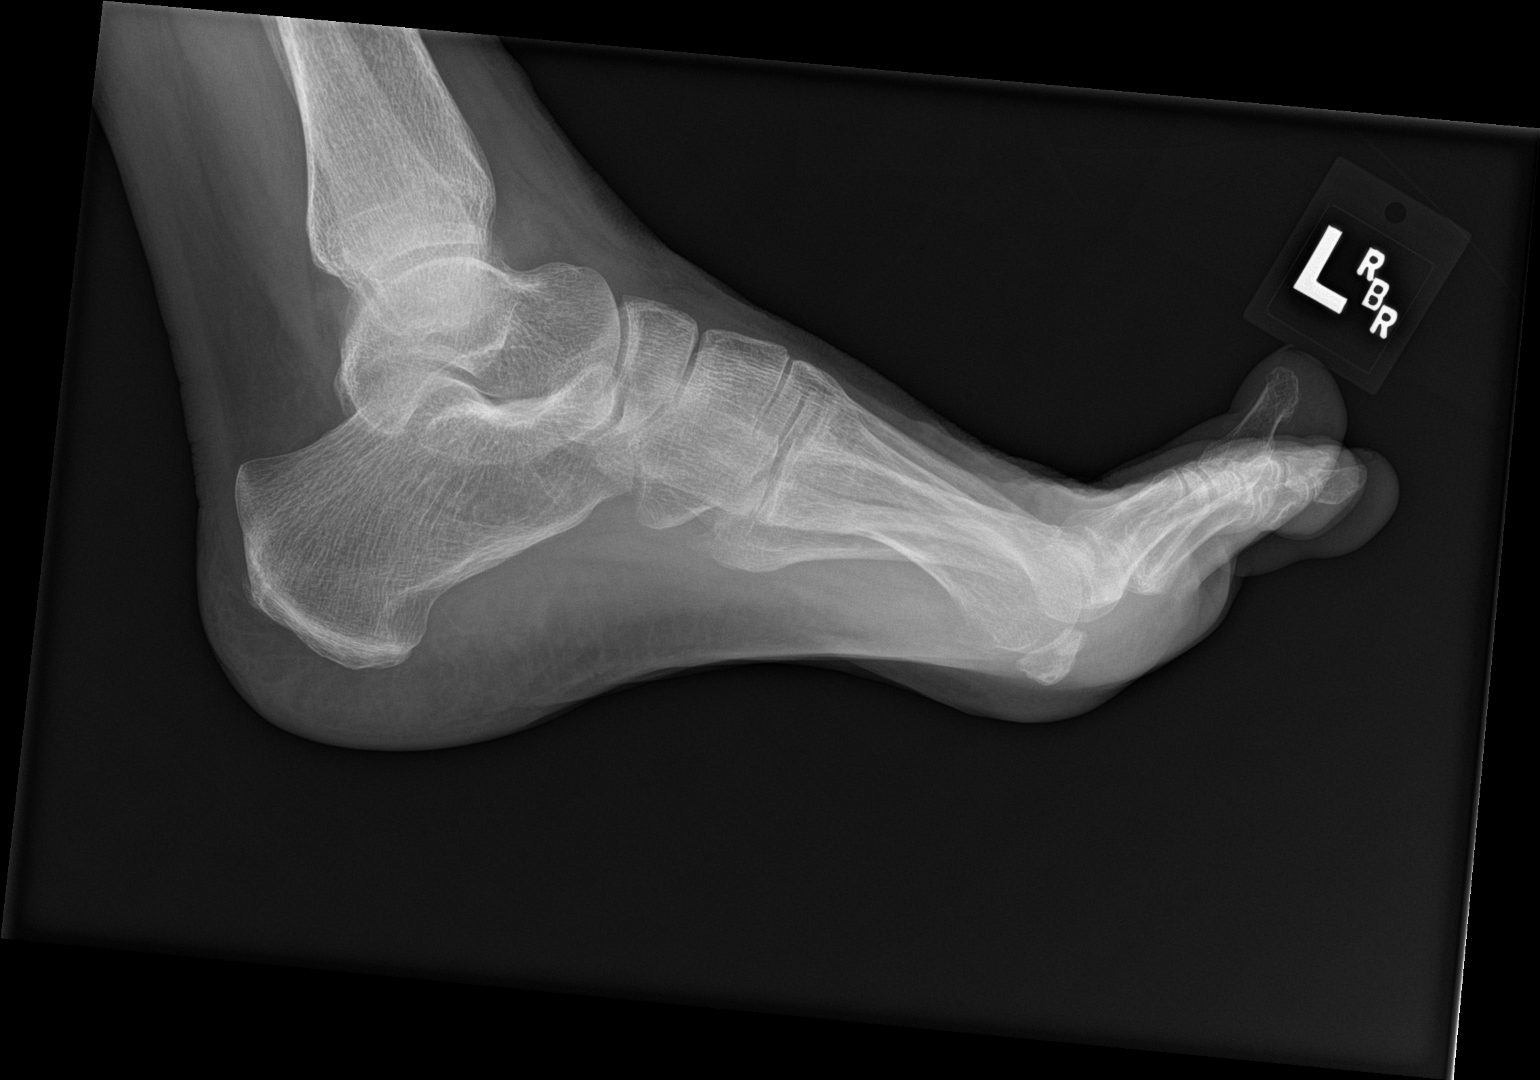

[3 of 3 positions shown; findings below may reference images not displayed]

FINDINGS: Frontal, oblique, and lateral views were obtained. There is no
fracture or dislocation. Joint spaces appear normal. No erosive
change. Bones appear somewhat osteoporotic.
IMPRESSION: Bones appear somewhat osteoporotic. No fracture or dislocation. No
evident arthropathy.

## 2021-01-17 ENCOUNTER — Encounter: Payer: Self-pay | Admitting: Medical-Surgical

## 2021-01-17 ENCOUNTER — Other Ambulatory Visit: Payer: Self-pay

## 2021-01-17 ENCOUNTER — Ambulatory Visit (INDEPENDENT_AMBULATORY_CARE_PROVIDER_SITE_OTHER): Payer: Self-pay | Admitting: Medical-Surgical

## 2021-01-17 VITALS — BP 160/71 | HR 74 | Temp 98.6°F | Ht 63.0 in | Wt 129.3 lb

## 2021-01-17 DIAGNOSIS — E782 Mixed hyperlipidemia: Secondary | ICD-10-CM

## 2021-01-17 DIAGNOSIS — Z1211 Encounter for screening for malignant neoplasm of colon: Secondary | ICD-10-CM

## 2021-01-17 DIAGNOSIS — I10 Essential (primary) hypertension: Secondary | ICD-10-CM

## 2021-01-17 DIAGNOSIS — Z1231 Encounter for screening mammogram for malignant neoplasm of breast: Secondary | ICD-10-CM

## 2021-01-17 DIAGNOSIS — J449 Chronic obstructive pulmonary disease, unspecified: Secondary | ICD-10-CM

## 2021-01-17 DIAGNOSIS — Z114 Encounter for screening for human immunodeficiency virus [HIV]: Secondary | ICD-10-CM

## 2021-01-17 MED ORDER — LOSARTAN POTASSIUM 100 MG PO TABS: 100.0000 mg | ORAL_TABLET | Freq: Every day | ORAL | 3 refills | Status: DC

## 2021-01-17 MED ORDER — OMEPRAZOLE 40 MG PO CPDR: 40.0000 mg | DELAYED_RELEASE_CAPSULE | Freq: Every day | ORAL | 3 refills | Status: DC

## 2021-01-17 MED ORDER — HYDROCHLOROTHIAZIDE 25 MG PO TABS: 25.0000 mg | ORAL_TABLET | Freq: Every day | ORAL | 3 refills | Status: DC

## 2021-01-17 NOTE — Progress Notes (Signed)
New Patient Office Visit  Subjective:  Patient ID: [name], female    DOB: [birth year]  Age: 60 y.o. MRN: [id number]  CC:  Chief Complaint  Patient presents with  . Establish Care    Former pt of Dr. Denyse Amass  . Hypertension  . Hyperlipidemia  . COPD    HPI [name] presents to establish care.  She was previously seen by Dr. Denyse Amass with her last visit being in 2020.  Hypertension-previously treated with lisinopril 100 mg daily and HCTZ 25 mg daily.  Does not check her blood pressure at home.  Endorses occipital headaches over the past couple of weeks.  Has been off of medication for 1-2 months since she ran out of refills.  Denies palpitations, lower extremity edema, chest pain, shortness of breath, and vision changes.  Hyperlipidemia-does not take medication for this.  Reports that she did not know she had high cholesterol.  Last lipid panel in 03/2019 showed only mildly elevated total and LDL cholesterol.  GERD-takes Prilosec 40 mg daily, tolerating well without side effects.  Has been off of this medication as well.  Feels like her stomach is been far more upset lately since she has not been taking this.  Past Medical History:  Diagnosis Date  . Brain aneurysm   . COPD (chronic obstructive pulmonary disease) (HCC) 09/05/2014   Per notes from ED.    . DDD (degenerative disc disease), lumbar 11/06/2014  . Diplopia 09/05/2014   Resolved. Dr. Doristine Section.    . Former smoker 09/03/2014   Quit date 08/13/14   . Hyperlipidemia 09/05/2014  . Hypertension   . Non-traumatic intracranial subarachnoid hemorrhage (HCC)   . 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 Doristine Section. Given contacts and glasses. Follow up yearly.    . Primary osteoarthritis of both hips 11/07/2014    Past Surgical History:  Procedure Laterality Date  . BRAIN SURGERY    . CARDIAC CATHETERIZATION N/A 07/12/2015   Procedure: Left Heart Cath and Coronary Angiography;  Surgeon: Corky Crafts, MD;  Location: [facility] INVASIVE CV LAB;  Service: Cardiovascular;  Laterality: N/A;  . CESAREAN SECTION N/A   . IR GENERIC HISTORICAL  10/16/2016   IR ANGIO INTRA EXTRACRAN SEL INTERNAL CAROTID BILAT MOD SED 10/16/2016 [name], MD [facility]  . IR GENERIC HISTORICAL  10/16/2016   IR ANGIO VERTEBRAL SEL VERTEBRAL BILAT MOD SED 10/16/2016 [name], MD [facility]  . KIDNEY SURGERY Left    stent  . RADIOLOGY WITH ANESTHESIA N/A 08/14/2014   Procedure: Embolization   (RADIOLOGY WITH ANESTHESIA) ;  Surgeon: Medication Radiologist, MD;  [facility];  Service: Radiology;  Laterality: N/A;    Family History  Problem Relation Age of Onset  . Alcohol abuse Mother   . Breast cancer Mother   . Hyperlipidemia Mother   . Hypertension Mother   . Alcohol abuse Father   . Stroke Father   . Cerebral aneurysm Father        RUPTURE  . Hyperlipidemia Sister   . Hypertension Sister   . Aneurysm Sister   . Aneurysm Sister     Social History   Socioeconomic History  . Marital status: Single    Spouse name: Not on file  . Number of children: 2  . Years of education: Not on file  . Highest education level: Not on file  Occupational History  . Not on file  Tobacco Use  . Smoking status: Current Every Day Smoker  Types: E-cigarettes  . Smokeless tobacco: Never Used  Vaping Use  . Vaping Use: Every day  Substance and Sexual Activity  . Alcohol use: Yes    Alcohol/week: 4.0 - 8.0 standard drinks    Types: 4 - 8 Standard drinks or equivalent per week  . Drug use: No  . Sexual activity: Not Currently    Birth control/protection: Post-menopausal  Other Topics Concern  . Not on file  Social History Narrative  . Not on file   Social Determinants of Health   Financial Resource Strain: Not on file  Food Insecurity: Not on file  Transportation Needs: Not on file  Physical Activity: Not on file  Stress: Not on file  Social Connections: Not on file  Intimate Partner  Violence: Not on file    ROS Review of Systems  Constitutional: Negative for chills, fatigue, fever and unexpected weight change.  Eyes: Negative for visual disturbance.  Respiratory: Negative for cough, chest tightness, shortness of breath and wheezing.   Cardiovascular: Negative for chest pain, palpitations and leg swelling.  Gastrointestinal: Positive for abdominal pain and nausea. Negative for blood in stool, constipation, diarrhea and vomiting.  Neurological: Positive for headaches. Negative for dizziness and light-headedness.  Psychiatric/Behavioral: Negative for dysphoric mood, self-injury, sleep disturbance and suicidal ideas. The patient is not nervous/anxious.     Objective:   Today's Vitals: BP (!) 160/71   Pulse 74   Temp 98.6 F (37 C)   Ht 5\' 3"  (1.6 m)   Wt 129 lb 4.8 oz (58.7 kg)   SpO2 100%   BMI 22.90 kg/m   Physical Exam Vitals reviewed.  Constitutional:      General: She is not in acute distress.    Appearance: Normal appearance.  HENT:     Head: Normocephalic and atraumatic.  Cardiovascular:     Rate and Rhythm: Normal rate and regular rhythm.     Pulses: Normal pulses.     Heart sounds: Normal heart sounds. No murmur heard. No friction rub. No gallop.   Pulmonary:     Effort: Pulmonary effort is normal. No respiratory distress.     Breath sounds: Normal breath sounds. No wheezing.  Skin:    General: Skin is warm and dry.  Neurological:     Mental Status: She is alert and oriented to person, place, and time.  Psychiatric:        Mood and Affect: Mood normal.        Behavior: Behavior normal.        Thought Content: Thought content normal.        Judgment: Judgment normal.     Assessment & Plan:   1. Essential hypertension, benign Plan to check CBC with differential, CMP, and lipid panel.  She is self-pay so attempted to collect labs here in our practice.  2 unsuccessful venipunctures so labs were not collected.  We will need to try to draw  these when she comes in for her nurse visit.  Resume losartan 100 mg daily and hydrochlorothiazide 25 mg daily.  Refills sent to pharmacy. - hydrochlorothiazide (HYDRODIURIL) 25 MG tablet; Take 1 tablet (25 mg total) by mouth daily.  Dispense: 90 tablet; Refill: 3 - losartan (COZAAR) 100 MG tablet; Take 1 tablet (100 mg total) by mouth daily.  Dispense: 90 tablet; Refill: 3 - CBC with Differential/Platelet - COMPLETE METABOLIC PANEL WITH GFR - Lipid panel  2. Mixed hyperlipidemia Checking lipid panel.  See above for collection concerns - Lipid panel  3. Chronic obstructive pulmonary disease, unspecified COPD type (HCC) Stable, unmedicated at this time.  4.  GERD Resume omeprazole 40 mg daily, refill sent to pharmacy.  Outpatient Encounter Medications as of 01/17/2021  Medication Sig  . acetaminophen (TYLENOL) 500 MG tablet Take 1,000 mg by mouth every 6 (six) hours as needed for mild pain (pain).   . Cholecalciferol (VITAMIN D3) 2000 units TABS Take 2,000 Units by mouth daily.  . DiphenhydrAMINE HCl (BENADRYL PO) Take 1 tablet by mouth at bedtime as needed (SLEEP).  . Multiple Vitamin (MULTIVITAMIN WITH MINERALS) TABS tablet Take 1 tablet by mouth daily. Centrum Silver  . nitroGLYCERIN (NITROSTAT) 0.3 MG SL tablet Place 1 tablet (0.3 mg total) under the tongue every 5 (five) minutes as needed for chest pain.  . hydrochlorothiazide (HYDRODIURIL) 25 MG tablet Take 1 tablet (25 mg total) by mouth daily.  Marland Kitchen losartan (COZAAR) 100 MG tablet Take 1 tablet (100 mg total) by mouth daily.  Marland Kitchen omeprazole (PRILOSEC) 40 MG capsule Take 1 capsule (40 mg total) by mouth daily.  . [DISCONTINUED] hydrochlorothiazide (HYDRODIURIL) 25 MG tablet Take 1 tablet (25 mg total) by mouth daily. (Patient not taking: Reported on 01/17/2021)  . [DISCONTINUED] losartan (COZAAR) 100 MG tablet Take 1 tablet (100 mg total) by mouth daily. (Patient not taking: Reported on 01/17/2021)  . [DISCONTINUED] omeprazole (PRILOSEC)  40 MG capsule Take 1 capsule (40 mg total) by mouth daily. (Patient not taking: Reported on 01/17/2021)   No facility-administered encounter medications on file as of 01/17/2021.    Follow-up: Return in about 2 weeks (around 01/31/2021) for nurse visit for BP check.   Thayer Ohm, DNP, APRN, FNP-BC [facility]

## 2021-01-31 ENCOUNTER — Other Ambulatory Visit: Payer: Self-pay

## 2021-01-31 ENCOUNTER — Other Ambulatory Visit: Payer: Self-pay | Admitting: Medical-Surgical

## 2021-01-31 ENCOUNTER — Ambulatory Visit (INDEPENDENT_AMBULATORY_CARE_PROVIDER_SITE_OTHER): Payer: Self-pay | Admitting: Medical-Surgical

## 2021-01-31 VITALS — BP 121/65 | HR 97 | Temp 98.8°F | Resp 20 | Ht 63.0 in | Wt 129.0 lb

## 2021-01-31 DIAGNOSIS — I10 Essential (primary) hypertension: Secondary | ICD-10-CM

## 2021-01-31 NOTE — Progress Notes (Signed)
Established Patient Office Visit  Subjective:  Patient ID: [name], female    DOB: [birth year]  Age: 59 y.o. MRN: [id number]  CC:  Chief Complaint  Patient presents with  . Hypertension    HPI [name] presents for a BP check. First BP is 137/63, second BP 121/65.  Past Medical History:  Diagnosis Date  . Brain aneurysm   . COPD (chronic obstructive pulmonary disease) (HCC) 09/05/2014   Per notes from ED.    . DDD (degenerative disc disease), lumbar 11/06/2014  . Diplopia 09/05/2014   Resolved. Dr. Doristine Section.    . Former smoker 09/03/2014   Quit date 08/13/14   . Hyperlipidemia 09/05/2014  . Hypertension   . Non-traumatic intracranial subarachnoid hemorrhage (HCC)   . 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 Doristine Section. Given contacts and glasses. Follow up yearly.    . Primary osteoarthritis of both hips 11/07/2014    Past Surgical History:  Procedure Laterality Date  . BRAIN SURGERY    . CARDIAC CATHETERIZATION N/A 07/12/2015   Procedure: Left Heart Cath and Coronary Angiography;  Surgeon: Corky Crafts, MD;  Location: [facility] INVASIVE CV LAB;  Service: Cardiovascular;  Laterality: N/A;  . CESAREAN SECTION N/A   . IR GENERIC HISTORICAL  10/16/2016   IR ANGIO INTRA EXTRACRAN SEL INTERNAL CAROTID BILAT MOD SED 10/16/2016 [name], MD [facility]  . IR GENERIC HISTORICAL  10/16/2016   IR ANGIO VERTEBRAL SEL VERTEBRAL BILAT MOD SED 10/16/2016 [name], MD [facility]  . KIDNEY SURGERY Left    stent  . RADIOLOGY WITH ANESTHESIA N/A 08/14/2014   Procedure: Embolization   (RADIOLOGY WITH ANESTHESIA) ;  Surgeon: Medication Radiologist, MD;  Location: [facility] OR;  Service: Radiology;  Laterality: N/A;    Family History  Problem Relation Age of Onset  . Alcohol abuse Mother   . Breast cancer Mother   . Hyperlipidemia Mother   . Hypertension Mother   . Alcohol abuse Father   . Stroke Father   . Cerebral aneurysm Father        RUPTURE   . Hyperlipidemia Sister   . Hypertension Sister   . Aneurysm Sister   . Aneurysm Sister     Social History   Socioeconomic History  . Marital status: Single    Spouse name: Not on file  . Number of children: 2  . Years of education: Not on file  . Highest education level: Not on file  Occupational History  . Not on file  Tobacco Use  . Smoking status: Current Every Day Smoker    Types: E-cigarettes  . Smokeless tobacco: Never Used  Vaping Use  . Vaping Use: Every day  Substance and Sexual Activity  . Alcohol use: Yes    Alcohol/week: 4.0 - 8.0 standard drinks    Types: 4 - 8 Standard drinks or equivalent per week  . Drug use: No  . Sexual activity: Not Currently    Birth control/protection: Post-menopausal  Other Topics Concern  . Not on file  Social History Narrative  . Not on file   Social Determinants of Health   Financial Resource Strain: Not on file  Food Insecurity: Not on file  Transportation Needs: Not on file  Physical Activity: Not on file  Stress: Not on file  Social Connections: Not on file  Intimate Partner Violence: Not on file    Outpatient Medications Prior to Visit  Medication Sig Dispense Refill  . acetaminophen (TYLENOL) 500 MG  tablet Take 1,000 mg by mouth every 6 (six) hours as needed for mild pain (pain).     . Cholecalciferol (VITAMIN D3) 2000 units TABS Take 2,000 Units by mouth daily.    . DiphenhydrAMINE HCl (BENADRYL PO) Take 1 tablet by mouth at bedtime as needed (SLEEP).    . hydrochlorothiazide (HYDRODIURIL) 25 MG tablet Take 1 tablet (25 mg total) by mouth daily. 90 tablet 3  . losartan (COZAAR) 100 MG tablet Take 1 tablet (100 mg total) by mouth daily. 90 tablet 3  . Multiple Vitamin (MULTIVITAMIN WITH MINERALS) TABS tablet Take 1 tablet by mouth daily. Centrum Silver    . nitroGLYCERIN (NITROSTAT) 0.3 MG SL tablet Place 1 tablet (0.3 mg total) under the tongue every 5 (five) minutes as needed for chest pain. 90 tablet 12  .  omeprazole (PRILOSEC) 40 MG capsule Take 1 capsule (40 mg total) by mouth daily. 90 capsule 3   No facility-administered medications prior to visit.    Allergies  Allergen Reactions  . Aspirin Hives  . Ibuprofen Hives  . Iodine Hives  . Oysters [Shellfish Allergy] Hives and Nausea And Vomiting    Reaction to oysters, clams and crab legs    ROS Review of Systems    Objective:    Physical Exam  BP 121/65 (BP Location: Left Arm, Patient Position: Sitting, Cuff Size: Normal)   Pulse 97   Temp 98.8 F (37.1 C) (Oral)   Resp 20   Ht 5\' 3"  (1.6 m)   Wt 129 lb (58.5 kg)   SpO2 100%   BMI 22.85 kg/m  Wt Readings from Last 3 Encounters:  01/31/21 129 lb (58.5 kg)  01/17/21 129 lb 4.8 oz (58.7 kg)  03/21/19 136 lb (61.7 kg)     Health Maintenance Due  Topic Date Due  . Zoster Vaccines- Shingrix (1 of 2) Never done  . PAP SMEAR-Modifier  01/12/2020    There are no preventive care reminders to display for this patient.  No results found for: TSH Lab Results  Component Value Date   WBC 5.6 03/21/2019   HGB 13.2 03/21/2019   HCT 37.5 03/21/2019   MCV 97.9 03/21/2019   PLT 369 03/21/2019   Lab Results  Component Value Date   NA 135 03/21/2019   K 4.7 03/21/2019   CO2 29 03/21/2019   GLUCOSE 96 03/21/2019   BUN 13 03/21/2019   CREATININE 0.88 03/21/2019   BILITOT 0.5 03/21/2019   ALKPHOS 126 12/02/2016   AST 19 03/21/2019   ALT 12 03/21/2019   PROT 7.2 03/21/2019   ALBUMIN 4.3 12/02/2016   CALCIUM 9.9 03/21/2019   ANIONGAP 11 10/16/2016   Lab Results  Component Value Date   CHOL 208 (H) 03/21/2019   Lab Results  Component Value Date   HDL 84 03/21/2019   Lab Results  Component Value Date   LDLCALC 103 (H) 03/21/2019   Lab Results  Component Value Date   TRIG 112 03/21/2019   Lab Results  Component Value Date   CHOLHDL 2.5 03/21/2019   No results found for: HGBA1C    Assessment & Plan:   Problem List Items Addressed This Visit   None    Visit Diagnoses    Essential hypertension, benign    -  Primary      No orders of the defined types were placed in this encounter.   Follow-up: Return in about 3 months (around 05/03/2021).    05/05/2021, CMA

## 2021-01-31 NOTE — Progress Notes (Signed)
Continue losartan 100 mg and HCTZ 25 mg daily as prescribed.  Continue low-sodium diet.  Labs collected in office today.  Monitor blood pressure at home with goal of 130/80 or less.  Plan to return in 3 months for hypertension follow-up.  Thayer Ohm, DNP, APRN, FNP-BC [facility] and [facility] Medicine

## 2021-02-04 LAB — COMPLETE METABOLIC PANEL WITH GFR
AG Ratio: 1.7 (calc) (ref 1.0–2.5)
ALT: 10 U/L (ref 6–29)
AST: 25 U/L (ref 10–35)
Albumin: 4.2 g/dL (ref 3.6–5.1)
Alkaline phosphatase (APISO): 112 U/L (ref 37–153)
BUN: 11 mg/dL (ref 7–25)
CO2: 26 mmol/L (ref 20–32)
Calcium: 9.1 mg/dL (ref 8.6–10.4)
Chloride: 88 mmol/L — ABNORMAL LOW (ref 98–110)
Creat: 0.72 mg/dL (ref 0.50–0.99)
GFR, Est African American: 105 mL/min/{1.73_m2} (ref >=60)
GFR, Est Non African American: 90 mL/min/{1.73_m2} (ref >=60)
Globulin: 2.5 g/dL (calc) (ref 1.9–3.7)
Glucose, Bld: 94 mg/dL (ref 65–99)
Potassium: 4.7 mmol/L (ref 3.5–5.3)
Sodium: 127 mmol/L — ABNORMAL LOW (ref 135–146)
Total Bilirubin: 0.5 mg/dL (ref 0.2–1.2)
Total Protein: 6.7 g/dL (ref 6.1–8.1)

## 2021-02-04 LAB — CBC WITH DIFFERENTIAL/PLATELET
Absolute Monocytes: 386 cells/uL (ref 200–950)
Basophils Absolute: 28 cells/uL (ref 0–200)
Basophils Relative: 0.4 %
Eosinophils Absolute: 21 cells/uL (ref 15–500)
Eosinophils Relative: 0.3 %
HCT: 39.2 % (ref 35.0–45.0)
Hemoglobin: 13 g/dL (ref 11.7–15.5)
Lymphs Abs: 1746 cells/uL (ref 850–3900)
MCH: 33.6 pg — ABNORMAL HIGH (ref 27.0–33.0)
MCHC: 33.2 g/dL (ref 32.0–36.0)
MCV: 101.3 fL — ABNORMAL HIGH (ref 80.0–100.0)
MPV: 8.5 fL (ref 7.5–12.5)
Monocytes Relative: 5.6 %
Neutro Abs: 4720 cells/uL (ref 1500–7800)
Neutrophils Relative %: 68.4 %
Platelets: 344 10*3/uL (ref 140–400)
RBC: 3.87 10*6/uL (ref 3.80–5.10)
RDW: 11.9 % (ref 11.0–15.0)
Total Lymphocyte: 25.3 %
WBC: 6.9 10*3/uL (ref 3.8–10.8)

## 2021-02-04 LAB — LIPID PANEL
Cholesterol: 177 mg/dL (ref <200)
HDL: 66 mg/dL (ref >=50)
LDL Cholesterol (Calc): 74 mg/dL (calc)
Non-HDL Cholesterol (Calc): 111 mg/dL (calc) (ref <130)
Total CHOL/HDL Ratio: 2.7 (calc) (ref <5.0)
Triglycerides: 304 mg/dL — ABNORMAL HIGH (ref <150)

## 2021-02-04 LAB — HOUSE ACCOUNT TRACKING

## 2021-04-22 DIAGNOSIS — I671 Cerebral aneurysm, nonruptured: Secondary | ICD-10-CM | POA: Diagnosis not present

## 2021-04-22 DIAGNOSIS — I609 Nontraumatic subarachnoid hemorrhage, unspecified: Secondary | ICD-10-CM | POA: Diagnosis not present

## 2021-05-07 DIAGNOSIS — I10 Essential (primary) hypertension: Secondary | ICD-10-CM | POA: Diagnosis not present

## 2021-05-07 DIAGNOSIS — I609 Nontraumatic subarachnoid hemorrhage, unspecified: Secondary | ICD-10-CM | POA: Diagnosis not present

## 2021-10-15 ENCOUNTER — Telehealth (INDEPENDENT_AMBULATORY_CARE_PROVIDER_SITE_OTHER): Payer: BC Managed Care – PPO | Admitting: Medical-Surgical

## 2021-10-15 ENCOUNTER — Encounter: Payer: Self-pay | Admitting: Medical-Surgical

## 2021-10-15 DIAGNOSIS — R21 Rash and other nonspecific skin eruption: Secondary | ICD-10-CM | POA: Diagnosis not present

## 2021-10-15 MED ORDER — TRIAMCINOLONE ACETONIDE 0.1 % EX CREA: 1.0000 "application " | TOPICAL_CREAM | Freq: Two times a day (BID) | CUTANEOUS | 0 refills | Status: AC

## 2021-10-15 MED ORDER — PREDNISONE 10 MG (48) PO TBPK: ORAL_TABLET | Freq: Every day | ORAL | 0 refills | Status: DC

## 2021-10-15 MED ORDER — HYDROXYZINE PAMOATE 25 MG PO CAPS: 25.0000 mg | ORAL_CAPSULE | Freq: Three times a day (TID) | ORAL | 0 refills | Status: AC | PRN

## 2021-10-15 NOTE — Progress Notes (Signed)
Virtual Visit via Telephone   I connected with  [name]  on 10/15/21 by telephone/telehealth and verified that I am speaking with the correct person using two identifiers.   I discussed the limitations, risks, security and privacy concerns of performing an evaluation and management service by telephone, including the higher likelihood of inaccurate diagnosis and treatment, and the availability of in person appointments.  We also discussed the likely need of an additional face to face encounter for complete and high quality delivery of care.  I also discussed with the patient that there may be a patient responsible charge related to this service. The patient expressed understanding and wishes to proceed.  Provider location is in medical facility. Patient location is at their home, different from provider location. People involved in care of the patient during this telehealth encounter were myself, my nurse/medical assistant, and my front office/scheduling team member.  CC: Rash  HPI: Pleasant 61 year old female presenting via telephone visit to discuss a rash that she has had for at least 1 full year.  Notes that the rash started in a small spot on her lower extremity and has since spread to cover most surfaces of her body.  Notes that the patches are small and erythematous however the one on her back and the one on her leg have gotten larger.  Notes the rash is extremely itchy and she has been scratching at night, drawing blood at times.  She has intermittently used Benadryl but only takes this every now and then rather than regularly.  She has also used over-the-counter cortisone cream and cortisone formulated for eczema with no relief.  She has not tried any prescription medications for the rash and has not had this fully evaluated.  She would like to have a referral to dermatology for further investigation.  No new foods, chemicals, cosmetics, medications, or environmental  exposures.  Review of Systems: See HPI for pertinent positives and negatives.   Objective Findings:    General: Speaking full sentences, no audible heavy breathing.  Sounds alert and appropriately interactive.    Impression and Recommendations:    1. Rash Since this has not been treated with prescription medications, we will do a 12-day taper of prednisone.  Also sending in triamcinolone to use on the particularly troublesome lesions.  Advised to avoid using this more than twice a day and for no more than 14 days consecutively.  Referring to dermatology per patient request. - Ambulatory referral to Dermatology  I discussed the above assessment and treatment plan with the patient. The patient was provided an opportunity to ask questions and all were answered. The patient agreed with the plan and demonstrated an understanding of the instructions.   The patient was advised to call back or seek an in-person evaluation if the symptoms worsen or if the condition fails to improve as anticipated.  20 minutes of non-face-to-face time was provided during this encounter.  Return if symptoms worsen or fail to improve. ___________________________________________ Christen Butter, DNP, APRN, FNP-BC Primary Care and Sports Medicine [facility] [place]

## 2021-10-15 NOTE — Progress Notes (Signed)
Wants dermatology referral  'full body" rash for about a year

## 2021-10-21 DIAGNOSIS — L209 Atopic dermatitis, unspecified: Secondary | ICD-10-CM | POA: Diagnosis not present

## 2021-10-21 DIAGNOSIS — L281 Prurigo nodularis: Secondary | ICD-10-CM | POA: Diagnosis not present

## 2021-12-01 DIAGNOSIS — L209 Atopic dermatitis, unspecified: Secondary | ICD-10-CM | POA: Diagnosis not present

## 2021-12-01 DIAGNOSIS — L281 Prurigo nodularis: Secondary | ICD-10-CM | POA: Diagnosis not present

## 2022-01-14 ENCOUNTER — Other Ambulatory Visit: Payer: Self-pay | Admitting: Medical-Surgical

## 2022-01-14 DIAGNOSIS — I10 Essential (primary) hypertension: Secondary | ICD-10-CM

## 2022-02-11 ENCOUNTER — Other Ambulatory Visit: Payer: Self-pay | Admitting: Medical-Surgical

## 2022-02-11 DIAGNOSIS — K219 Gastro-esophageal reflux disease without esophagitis: Secondary | ICD-10-CM | POA: Diagnosis not present

## 2022-02-11 DIAGNOSIS — I10 Essential (primary) hypertension: Secondary | ICD-10-CM | POA: Diagnosis not present

## 2022-02-11 DIAGNOSIS — E785 Hyperlipidemia, unspecified: Secondary | ICD-10-CM | POA: Diagnosis not present

## 2022-02-11 DIAGNOSIS — Z7689 Persons encountering health services in other specified circumstances: Secondary | ICD-10-CM | POA: Diagnosis not present

## 2022-02-11 DIAGNOSIS — I607 Nontraumatic subarachnoid hemorrhage from unspecified intracranial artery: Secondary | ICD-10-CM | POA: Insufficient documentation

## 2022-05-18 DIAGNOSIS — S0501XA Injury of conjunctiva and corneal abrasion without foreign body, right eye, initial encounter: Secondary | ICD-10-CM | POA: Diagnosis not present

## 2022-06-09 DIAGNOSIS — I609 Nontraumatic subarachnoid hemorrhage, unspecified: Secondary | ICD-10-CM | POA: Diagnosis not present

## 2022-06-17 DIAGNOSIS — I609 Nontraumatic subarachnoid hemorrhage, unspecified: Secondary | ICD-10-CM | POA: Diagnosis not present

## 2022-08-10 DIAGNOSIS — Z79899 Other long term (current) drug therapy: Secondary | ICD-10-CM | POA: Diagnosis not present

## 2022-08-10 DIAGNOSIS — Z886 Allergy status to analgesic agent status: Secondary | ICD-10-CM | POA: Diagnosis not present

## 2022-08-10 DIAGNOSIS — F17290 Nicotine dependence, other tobacco product, uncomplicated: Secondary | ICD-10-CM | POA: Diagnosis not present

## 2022-08-10 DIAGNOSIS — Z888 Allergy status to other drugs, medicaments and biological substances status: Secondary | ICD-10-CM | POA: Diagnosis not present

## 2022-08-10 DIAGNOSIS — S52614A Nondisplaced fracture of right ulna styloid process, initial encounter for closed fracture: Secondary | ICD-10-CM | POA: Diagnosis not present

## 2022-08-10 DIAGNOSIS — W010XXA Fall on same level from slipping, tripping and stumbling without subsequent striking against object, initial encounter: Secondary | ICD-10-CM | POA: Diagnosis not present

## 2022-08-10 DIAGNOSIS — M25531 Pain in right wrist: Secondary | ICD-10-CM | POA: Diagnosis not present

## 2022-08-10 DIAGNOSIS — M25431 Effusion, right wrist: Secondary | ICD-10-CM | POA: Diagnosis not present

## 2022-08-10 DIAGNOSIS — I10 Essential (primary) hypertension: Secondary | ICD-10-CM | POA: Diagnosis not present

## 2022-08-10 DIAGNOSIS — S52501A Unspecified fracture of the lower end of right radius, initial encounter for closed fracture: Secondary | ICD-10-CM | POA: Diagnosis not present

## 2022-08-10 DIAGNOSIS — Z91013 Allergy to seafood: Secondary | ICD-10-CM | POA: Diagnosis not present

## 2022-08-10 DIAGNOSIS — S52591A Other fractures of lower end of right radius, initial encounter for closed fracture: Secondary | ICD-10-CM | POA: Diagnosis not present

## 2022-08-11 ENCOUNTER — Telehealth: Payer: Self-pay | Admitting: General Practice

## 2022-08-11 NOTE — Telephone Encounter (Signed)
Transition Care Management Unsuccessful Follow-up Telephone Call  Date of discharge and from where:  08/10/22 from [facility]  Attempts:  1st Attempt  Reason for unsuccessful TCM follow-up call:  Left voice message

## 2022-08-12 ENCOUNTER — Ambulatory Visit (INDEPENDENT_AMBULATORY_CARE_PROVIDER_SITE_OTHER): Payer: BC Managed Care – PPO | Admitting: Sports Medicine

## 2022-08-12 ENCOUNTER — Ambulatory Visit (INDEPENDENT_AMBULATORY_CARE_PROVIDER_SITE_OTHER): Payer: BC Managed Care – PPO

## 2022-08-12 DIAGNOSIS — S52531A Colles' fracture of right radius, initial encounter for closed fracture: Secondary | ICD-10-CM

## 2022-08-12 DIAGNOSIS — H18593 Other hereditary corneal dystrophies, bilateral: Secondary | ICD-10-CM | POA: Diagnosis not present

## 2022-08-12 DIAGNOSIS — S52614A Nondisplaced fracture of right ulna styloid process, initial encounter for closed fracture: Secondary | ICD-10-CM | POA: Diagnosis not present

## 2022-08-12 DIAGNOSIS — H35373 Puckering of macula, bilateral: Secondary | ICD-10-CM | POA: Diagnosis not present

## 2022-08-12 DIAGNOSIS — H25813 Combined forms of age-related cataract, bilateral: Secondary | ICD-10-CM | POA: Diagnosis not present

## 2022-08-12 DIAGNOSIS — H52223 Regular astigmatism, bilateral: Secondary | ICD-10-CM | POA: Diagnosis not present

## 2022-08-12 DIAGNOSIS — S52501A Unspecified fracture of the lower end of right radius, initial encounter for closed fracture: Secondary | ICD-10-CM | POA: Insufficient documentation

## 2022-08-12 MED ORDER — HYDROCODONE-ACETAMINOPHEN 10-325 MG PO TABS: 1.0000 | ORAL_TABLET | Freq: Three times a day (TID) | ORAL | 0 refills | Status: DC | PRN

## 2022-08-12 NOTE — Telephone Encounter (Signed)
Transition Care Management Unsuccessful Follow-up Telephone Call  Date of discharge and from where:  08/10/22 from [facility]  Attempts:  2nd Attempt  Reason for unsuccessful TCM follow-up call:  No answer/busy Patient has an appt with PCP today.

## 2022-08-12 NOTE — Addendum Note (Signed)
Addended by: Monica Becton on: 08/12/2022 12:12 PM   Modules accepted: Orders

## 2022-08-12 NOTE — Progress Notes (Signed)
    Procedures performed today:    None.  Independent interpretation of notes and tests performed by another provider:   None.  Brief History, Exam, Impression, and Recommendations:    Distal radius fracture, right This is a very pleasant 61 year old female, a couple of days ago she had a fall onto an outstretched hand, was seen in the emergency department where x-rays showed a distal radius fracture, impacted with potential ulnar styloid fracture. I am unable to see the images, she was placed in a sugar-tong splint and referred to me. She still having significant amount of pain, so increasing her pain medication to hydrocodone 10/325. Will get updated x-rays, I like to see her back in a week or 2 for cast placement, this of course depends on how her fracture looks.    ____________________________________________ Ihor Austin. Benjamin Stain, M.D., ABFM., CAQSM., AME. Primary Care and Sports Medicine [facility]  Adjunct Professor of Family Medicine  [employer]  Restaurant manager, fast food

## 2022-08-12 NOTE — Assessment & Plan Note (Addendum)
This is a very pleasant 61 year old female, a couple of days ago she had a fall onto an outstretched hand, was seen in the emergency department where x-rays showed a distal radius fracture, impacted with potential ulnar styloid fracture. I am unable to see the images, she was placed in a sugar-tong splint and referred to me. She still having significant amount of pain, so increasing her pain medication to hydrocodone 10/325. Will get updated x-rays, I like to see her back in a week or 2 for cast placement, this of course depends on how her fracture looks.

## 2022-08-17 NOTE — Telephone Encounter (Signed)
Transition Care Management Follow-up Telephone Call Date of discharge and from where: 08/10/22 from [facility] How have you been since you were released from the hospital? Patient had on OV with PCP on 08/12/22 Any questions or concerns? No

## 2022-08-18 ENCOUNTER — Telehealth: Payer: Self-pay

## 2022-08-18 NOTE — Telephone Encounter (Signed)
Patient dropped off paperwork to be filled out for disability and leave providers statement. Pt is aware that it could take 3-5 business days and could cost $29. Patient would like a call when paperwork is completed and faxed. Paperwork is placed in the basket. Tvt

## 2022-08-19 NOTE — Telephone Encounter (Signed)
Disability paperwork as always needs an appointment, looks like she is seeing me tomorrow so I can fill it out at that visit.

## 2022-08-19 NOTE — Telephone Encounter (Signed)
Pt is okay with you filling out the paperwork at visit tomorrow. tvt

## 2022-08-20 ENCOUNTER — Ambulatory Visit (INDEPENDENT_AMBULATORY_CARE_PROVIDER_SITE_OTHER): Payer: BC Managed Care – PPO | Admitting: Sports Medicine

## 2022-08-20 DIAGNOSIS — S52531D Colles' fracture of right radius, subsequent encounter for closed fracture with routine healing: Secondary | ICD-10-CM | POA: Diagnosis not present

## 2022-08-20 MED ORDER — HYDROCODONE-ACETAMINOPHEN 10-325 MG PO TABS: 1.0000 | ORAL_TABLET | Freq: Three times a day (TID) | ORAL | 0 refills | Status: DC | PRN

## 2022-08-20 NOTE — Progress Notes (Signed)
    Procedures performed today:    Short arm cast placed today.  Independent interpretation of notes and tests performed by another provider:   None.  Brief History, Exam, Impression, and Recommendations:    Distal radius fracture, right This is a very pleasant 61 year old female, she is now approximately 2 weeks post fall onto an outstretched hand for x-rays showed an impacted distal radius fracture with an ulnar styloid fracture, distal radius fracture did show fairly neutral variance, however she had good volar tilt and inclination so no closed reduction was performed. I placed her in a sugar-tong splint until swelling resolved, she is back today, everything looks good, I placed a short arm cast. Refilling hydrocodone, return to see me back in 4 weeks. I did fill out disability paperwork today with a 1 month return to work date. I did inform her that she would probably need some physical therapy after cast removal.  I spent 30 minutes of total time managing this patient today, this includes chart review, face to face, and non-face to face time.  This was separate from the time spent applying the cast.  ____________________________________________ Ihor Austin. Benjamin Stain, M.D., ABFM., CAQSM., AME. Primary Care and Sports Medicine  [facility]  Adjunct Professor of Family Medicine  [employer]  Restaurant manager, fast food

## 2022-08-20 NOTE — Assessment & Plan Note (Signed)
This is a very pleasant 61 year old female, she is now approximately 2 weeks post fall onto an outstretched hand for x-rays showed an impacted distal radius fracture with an ulnar styloid fracture, distal radius fracture did show fairly neutral variance, however she had good volar tilt and inclination so no closed reduction was performed. I placed her in a sugar-tong splint until swelling resolved, she is back today, everything looks good, I placed a short arm cast. Refilling hydrocodone, return to see me back in 4 weeks. I did fill out disability paperwork today with a 1 month return to work date. I did inform her that she would probably need some physical therapy after cast removal.

## 2022-08-21 ENCOUNTER — Telehealth: Payer: Self-pay

## 2022-08-21 DIAGNOSIS — H25813 Combined forms of age-related cataract, bilateral: Secondary | ICD-10-CM | POA: Diagnosis not present

## 2022-08-21 DIAGNOSIS — H35373 Puckering of macula, bilateral: Secondary | ICD-10-CM | POA: Diagnosis not present

## 2022-08-21 DIAGNOSIS — H52223 Regular astigmatism, bilateral: Secondary | ICD-10-CM | POA: Diagnosis not present

## 2022-08-21 DIAGNOSIS — H18593 Other hereditary corneal dystrophies, bilateral: Secondary | ICD-10-CM | POA: Diagnosis not present

## 2022-08-21 NOTE — Telephone Encounter (Signed)
Initiated Prior authorization LID:[id number]  tablets Via: Covermymeds Case/Key:[id number] Status: approved  as of 08/19/22 Reason:this request is approved from 08/14/2022 to 02/10/2023. Notified Pt via: Pt does not have Mychart

## 2022-08-25 DIAGNOSIS — Z87891 Personal history of nicotine dependence: Secondary | ICD-10-CM | POA: Diagnosis not present

## 2022-08-25 DIAGNOSIS — H43813 Vitreous degeneration, bilateral: Secondary | ICD-10-CM | POA: Diagnosis not present

## 2022-08-25 DIAGNOSIS — Z8673 Personal history of transient ischemic attack (TIA), and cerebral infarction without residual deficits: Secondary | ICD-10-CM | POA: Diagnosis not present

## 2022-08-25 DIAGNOSIS — E785 Hyperlipidemia, unspecified: Secondary | ICD-10-CM | POA: Diagnosis not present

## 2022-08-25 DIAGNOSIS — Z79899 Other long term (current) drug therapy: Secondary | ICD-10-CM | POA: Diagnosis not present

## 2022-08-25 DIAGNOSIS — J449 Chronic obstructive pulmonary disease, unspecified: Secondary | ICD-10-CM | POA: Diagnosis not present

## 2022-08-25 DIAGNOSIS — H52223 Regular astigmatism, bilateral: Secondary | ICD-10-CM | POA: Diagnosis not present

## 2022-08-25 DIAGNOSIS — M1611 Unilateral primary osteoarthritis, right hip: Secondary | ICD-10-CM | POA: Diagnosis not present

## 2022-08-25 DIAGNOSIS — Z91040 Latex allergy status: Secondary | ICD-10-CM | POA: Diagnosis not present

## 2022-08-25 DIAGNOSIS — I10 Essential (primary) hypertension: Secondary | ICD-10-CM | POA: Diagnosis not present

## 2022-08-25 DIAGNOSIS — H18593 Other hereditary corneal dystrophies, bilateral: Secondary | ICD-10-CM | POA: Diagnosis not present

## 2022-08-25 DIAGNOSIS — H02834 Dermatochalasis of left upper eyelid: Secondary | ICD-10-CM | POA: Diagnosis not present

## 2022-08-25 DIAGNOSIS — H25813 Combined forms of age-related cataract, bilateral: Secondary | ICD-10-CM | POA: Diagnosis not present

## 2022-08-25 DIAGNOSIS — H02831 Dermatochalasis of right upper eyelid: Secondary | ICD-10-CM | POA: Diagnosis not present

## 2022-08-25 DIAGNOSIS — H25812 Combined forms of age-related cataract, left eye: Secondary | ICD-10-CM | POA: Insufficient documentation

## 2022-08-25 DIAGNOSIS — H2512 Age-related nuclear cataract, left eye: Secondary | ICD-10-CM | POA: Diagnosis not present

## 2022-08-25 DIAGNOSIS — M1612 Unilateral primary osteoarthritis, left hip: Secondary | ICD-10-CM | POA: Diagnosis not present

## 2022-08-25 DIAGNOSIS — H35373 Puckering of macula, bilateral: Secondary | ICD-10-CM | POA: Diagnosis not present

## 2022-08-27 ENCOUNTER — Telehealth: Payer: Self-pay

## 2022-08-27 NOTE — Telephone Encounter (Signed)
Patient called and states can she get a letter to get out of jury duty cause she is still in PT and still having pain please advise.

## 2022-08-27 NOTE — Telephone Encounter (Signed)
Patient notified

## 2022-08-27 NOTE — Telephone Encounter (Signed)
Letter written, patient can download it from MyChart

## 2022-09-03 DIAGNOSIS — I10 Essential (primary) hypertension: Secondary | ICD-10-CM | POA: Diagnosis not present

## 2022-09-03 DIAGNOSIS — Z91040 Latex allergy status: Secondary | ICD-10-CM | POA: Diagnosis not present

## 2022-09-03 DIAGNOSIS — H25813 Combined forms of age-related cataract, bilateral: Secondary | ICD-10-CM | POA: Diagnosis not present

## 2022-09-03 DIAGNOSIS — M1612 Unilateral primary osteoarthritis, left hip: Secondary | ICD-10-CM | POA: Diagnosis not present

## 2022-09-03 DIAGNOSIS — M1611 Unilateral primary osteoarthritis, right hip: Secondary | ICD-10-CM | POA: Diagnosis not present

## 2022-09-03 DIAGNOSIS — H2511 Age-related nuclear cataract, right eye: Secondary | ICD-10-CM | POA: Diagnosis not present

## 2022-09-03 DIAGNOSIS — Z8673 Personal history of transient ischemic attack (TIA), and cerebral infarction without residual deficits: Secondary | ICD-10-CM | POA: Diagnosis not present

## 2022-09-03 DIAGNOSIS — H02831 Dermatochalasis of right upper eyelid: Secondary | ICD-10-CM | POA: Diagnosis not present

## 2022-09-03 DIAGNOSIS — F17210 Nicotine dependence, cigarettes, uncomplicated: Secondary | ICD-10-CM | POA: Diagnosis not present

## 2022-09-03 DIAGNOSIS — H35373 Puckering of macula, bilateral: Secondary | ICD-10-CM | POA: Diagnosis not present

## 2022-09-03 DIAGNOSIS — H25811 Combined forms of age-related cataract, right eye: Secondary | ICD-10-CM | POA: Diagnosis not present

## 2022-09-03 DIAGNOSIS — Z79899 Other long term (current) drug therapy: Secondary | ICD-10-CM | POA: Diagnosis not present

## 2022-09-03 DIAGNOSIS — H02834 Dermatochalasis of left upper eyelid: Secondary | ICD-10-CM | POA: Diagnosis not present

## 2022-09-03 DIAGNOSIS — J449 Chronic obstructive pulmonary disease, unspecified: Secondary | ICD-10-CM | POA: Diagnosis not present

## 2022-09-03 DIAGNOSIS — H18593 Other hereditary corneal dystrophies, bilateral: Secondary | ICD-10-CM | POA: Diagnosis not present

## 2022-09-03 DIAGNOSIS — H52223 Regular astigmatism, bilateral: Secondary | ICD-10-CM | POA: Diagnosis not present

## 2022-09-03 DIAGNOSIS — E785 Hyperlipidemia, unspecified: Secondary | ICD-10-CM | POA: Diagnosis not present

## 2022-09-21 ENCOUNTER — Ambulatory Visit (INDEPENDENT_AMBULATORY_CARE_PROVIDER_SITE_OTHER): Payer: BC Managed Care – PPO

## 2022-09-21 ENCOUNTER — Ambulatory Visit (INDEPENDENT_AMBULATORY_CARE_PROVIDER_SITE_OTHER): Payer: BC Managed Care – PPO | Admitting: Sports Medicine

## 2022-09-21 DIAGNOSIS — S52531D Colles' fracture of right radius, subsequent encounter for closed fracture with routine healing: Secondary | ICD-10-CM

## 2022-09-21 DIAGNOSIS — S52571A Other intraarticular fracture of lower end of right radius, initial encounter for closed fracture: Secondary | ICD-10-CM | POA: Diagnosis not present

## 2022-09-21 NOTE — Assessment & Plan Note (Addendum)
6-week status post distal radius fracture, 4 weeks in cast, doing well out of cast today, she will have some formal physical therapy, return to see me February 2 before she returns to work.  I did review her x-rays today, she does have neutral radial variance, she has had some dorsal radial settling as expected, but still has fairly neutral volar tilt. There is good bony callus, no change in plan.

## 2022-09-21 NOTE — Progress Notes (Addendum)
    Procedures performed today:    None.  Independent interpretation of notes and tests performed by another provider:   X-rays personally reviewed, she has had some radial settling resulting in fairly neutral volar tilt, she has neutral radial variance, there is good bony callus formation.  Brief History, Exam, Impression, and Recommendations:    Distal radius fracture, right 6-week status post distal radius fracture, 4 weeks in cast, doing well out of cast today, she will have some formal physical therapy, return to see me February 2 before she returns to work.  I did review her x-rays today, she does have neutral radial variance, she has had some dorsal radial settling as expected, but still has fairly neutral volar tilt. There is good bony callus, no change in plan.    ____________________________________________ Gwen Her. Dianah Field, M.D., ABFM., CAQSM., AME. Primary Care and Sports Medicine [facility]  Adjunct Professor of [place] [employer] [facility] [employer]  Risk manager

## 2022-09-22 NOTE — Therapy (Signed)
OUTPATIENT PHYSICAL THERAPY SHOULDER EVALUATION   Patient Name: [name] MRN: [id number] DOB:1960/12/12, 62 y.o., female Today's Date: 09/23/2022  END OF SESSION:  PT End of Session - 09/23/22 0935     Visit Number 1    Number of Visits 16    Date for PT Re-Evaluation 11/18/22    PT Start Time 0932    PT Stop Time 1018    PT Time Calculation (min) 46 min    Activity Tolerance Patient tolerated treatment well             Past Medical History:  Diagnosis Date   Brain aneurysm    COPD (chronic obstructive pulmonary disease) ([place]) 09/05/2014   Per notes from ED.     DDD (degenerative disc disease), lumbar 11/06/2014   Diplopia 09/05/2014   Resolved. Dr. Gwenevere Ghazi.     Former smoker 09/03/2014   Quit date 08/13/14    Hyperlipidemia 09/05/2014   Hypertension    Non-traumatic intracranial subarachnoid hemorrhage ([place])    Pigmentary dispersion syndrome 10/10/2014   Via dr. Gwenevere Ghazi. Given contacts and glasses. Follow up yearly.     Primary osteoarthritis of both hips 11/07/2014   Past Surgical History:  Procedure Laterality Date   BRAIN SURGERY     CARDIAC CATHETERIZATION N/A 07/12/2015   Procedure: Left Heart Cath and Coronary Angiography;  Surgeon: Jettie Booze, MD;  Location: [facility];  Service: Cardiovascular;  Laterality: N/A;   CESAREAN SECTION N/A    IR GENERIC HISTORICAL  10/16/2016   IR ANGIO INTRA EXTRACRAN SEL INTERNAL CAROTID BILAT MOD SED 10/16/2016 Consuella Lose, MD [facility]   IR GENERIC HISTORICAL  10/16/2016   IR ANGIO VERTEBRAL SEL VERTEBRAL BILAT MOD SED 10/16/2016 Consuella Lose, MD [facility]   KIDNEY SURGERY Left    stent   RADIOLOGY WITH ANESTHESIA N/A 08/14/2014   Procedure: Embolization   (RADIOLOGY WITH ANESTHESIA) ;  Surgeon: Medication Radiologist, MD;  Location: [facility];  Service: Radiology;  Laterality: N/A;   Patient Active Problem List   Diagnosis Date Noted   Distal radius fracture, right 08/12/2022    Nontraumatic subarachnoid hemorrhage, unspecified ([place]) 11/02/2017   Chronic nonintractable headache 11/02/2017   Elevated MCV 12/02/2016   Low blood potassium 12/02/2016   Primary osteoarthritis of both hips 11/07/2014   DDD (degenerative disc disease), lumbar 11/06/2014   Pigmentary dispersion syndrome 10/10/2014   Hyperlipidemia 09/05/2014   Diplopia 09/05/2014   COPD (chronic obstructive pulmonary disease) ([place]) 09/05/2014   Essential (primary) hypertension 09/05/2014   Former smoker 09/03/2014   History of intracranial aneurysm 08/13/2014    PCP: Samuel Bouche, NP  REFERRING PROVIDER: Dr Aundria Mems  REFERRING DIAG: Closed Colles' fracture Rt wrist   THERAPY DIAG:  Arm pain, diffuse, right  Other symptoms and signs involving the musculoskeletal system  Stiffness of joint of right forearm  Muscle weakness (generalized)  Rationale for Evaluation and Treatment: Rehabilitation  ONSET DATE: 08/10/22  SUBJECTIVE:  SUBJECTIVE STATEMENT: Patient fell 08/10/22 onto outstretched arm. She was seen in ED and placed in an immobilizer with xrays showing closed Colles' fracture of Rt radius and ulna styloid. Was subsequently placed in a cast for 4 weeks with cast d/c'ed 09/21/22. She reports that her arm feels sore and swollen. She has limited movement.   PERTINENT HISTORY: Brain aneurysm ~ 20+ yrs ago; HTN; neck pain remote past    PAIN:  Are you having pain? Yes: NPRS scale: 4/10 Pain location: Rt wrist and thumb area  Pain description: aching  Aggravating factors: using arm; twisting wrist Relieving factors: cold; holding wrist area   PRECAUTIONS: Other: s/p fracture Rt radius and ulna 08/10/22  WEIGHT BEARING RESTRICTIONS: post fracture 08/10/22  FALLS:  Has patient fallen in last 6 months?  Yes. 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 Redemonstrated slightly comminuted distal radial fracture with intra-articular extension. Interval callus formation though a fracture line persists. No change in alignment. 2. Presumably old ulnar styloid process fracture. 3. Suspected widening of the scapholunate articulation as could be seen of the sequela of ligamentous injury.  PATIENT SURVEYS:  FOTO 33 goal 53  COGNITION: Overall cognitive status: Within functional limits for tasks assessed     SENSATION: WFL  POSTURE: Patient presents with head forward posture with increased thoracic kyphosis; shoulders rounded and elevated; scapulae abducted and rotated along the thoracic spine; head of the humerus anterior in orientation.   UPPER EXTREMITY ROM:    Discomfort or pain with all Rt wrist motions  Active ROM Right eval Left eval  Shoulder flexion    Shoulder extension    Shoulder abduction    Shoulder adduction    Shoulder internal rotation    Shoulder external rotation    Elbow flexion WNL WFL  Elbow extension full full  Wrist flexion 37 70  Wrist extension 38 60  Wrist ulnar deviation 17 43  Wrist radial deviation 25 36  Wrist pronation 84 90  Wrist supination 76 84  (Blank rows = not tested)  UPPER EXTREMITY MMT:   Bilat shoulder strength 5/5    Elbow and wrist not tested   MMT Right eval Left eval  Shoulder flexion    Shoulder extension    Shoulder abduction    Shoulder adduction    Shoulder internal rotation    Shoulder external rotation    Middle trapezius     Lower trapezius    Elbow flexion    Elbow extension    Wrist flexion    Wrist extension    Wrist ulnar deviation    Wrist radial deviation    Wrist pronation    Wrist supination    Grip strength (lbs)    (Blank rows = not tested)  PALPATION:  Tenderness to palpation Rt distal wrist radial and ulnar wrist    EDEMA:  Mild edema dorsum and radial hand/wrist    TODAY'S TREATMENT:  DATE: 09/23/22 [facility] Adult PT Treatment:                                                 Therapeutic Exercise: See HEP printed in Medbridge  Manual Therapy: Retrograde massage Rt hand/wrist/forearm Therapeutic Activity: Gentle gripping - washcloth of fabric  Modalities: Will ice at home  Self Care: Cautioned to avoid overuse of Rt UE and doing too many or being too aggressive with exercises    PATIENT EDUCATION: Education details: HEP POC Person educated: Patient Education method: Explanation, Demonstration, Tactile cues, Verbal cues, and Handouts Education comprehension: verbalized understanding, returned demonstration, verbal cues required, tactile cues required, and needs further education  HOME EXERCISE PROGRAM: Access Code: [email or web address] URL: [email or web address] Date: 09/23/2022 Prepared by: Corlis Leak  Program Notes sitting with elbow supported on table elbow padded - bend each joint of each finger to stretch -bend all finger joints into fist -singers straight, rooftop, finger stretch down, fist -bend wrist forward with fingers relaxed-bend wrist forward with light fist -bend wrist back with relaxed fingers-bend wrist back with fingers straight - move wrist to thumb side holding at palm not fingers -move wrist to little finger side holding at palm not fingers retrograde massage finger tips toward heart ice 3-4 times a day, especially after  exercises s  ASSESSMENT:  CLINICAL IMPRESSION: Patient is a 62 y.o. female who was seen today for physical therapy evaluation and treatment for Rt closed Colles' fracture 08/10/22. She was casted for 4 weeks with cast d/c'ed 09/21/22. She presents with decreased AROM Rt forearm and wrist; decreased strength Rt UE; mild edema; pain with active movement and functional activities; inability to perform tasks necessary for return to work.   OBJECTIVE IMPAIRMENTS: decreased mobility, decreased ROM, decreased strength, increased fascial restrictions, impaired flexibility, impaired UE functional use, and pain.   ACTIVITY LIMITATIONS: carrying, lifting, bathing, toileting, dressing, and reach over head  PARTICIPATION LIMITATIONS: meal prep, cleaning, laundry, and occupation  PERSONAL FACTORS: Time since onset of injury/illness/exacerbation are also affecting patient's functional outcome.   REHAB POTENTIAL: Good  CLINICAL DECISION MAKING: Stable/uncomplicated  EVALUATION COMPLEXITY: Low   GOALS: Goals reviewed with patient? Yes  SHORT TERM GOALS: Target date: 10/21/2022  Independent in initial HEP  Baseline: Goal status: INITIAL  2.  Patient reports using Rt UE for functional activities such as dressing, bathing, etc... Baseline:  Goal status: INITIAL   LONG TERM GOALS: Target date: 11/18/2022  Increase AROM Rt forearm and wrist to within 3-5 degrees of AROM Lt forearm and wrist  Baseline:  Goal status: INITIAL  2.  Pain free ROM Rt forearm and wrist  Baseline:  Goal status: INITIAL  3.  Increased strength Rt forearm; wrist; hand to 4+/5 to 5/5  Baseline:  Goal status: INITIAL  4.  Patient demonstrates and reports return to normal functional use of Rt UE  Baseline:  Goal status: INITIAL  5.  Independent in HEP  Baseline:  Goal status: INITIAL  6.  Improve functional limitation score to 57 Baseline: 33 Goal status: INITIAL  PLAN:  PT FREQUENCY: 2x/week  PT DURATION:  8 weeks  PLANNED INTERVENTIONS: Therapeutic exercises, Therapeutic activity, Neuromuscular re-education, Patient/Family education, Self Care, Joint mobilization, Aquatic Therapy, Dry Needling, Electrical stimulation, Cryotherapy, Moist heat, Taping, Ultrasound, Ionotophoresis 4mg /ml Dexamethasone, Manual therapy, and Re-evaluation  PLAN FOR NEXT SESSION: review and progress with  home exercise program; working on posture and alignment; manual work, dry needling, modalities as indicated.    Everardo All, PT 09/23/2022, 9:40 AM

## 2022-09-23 ENCOUNTER — Ambulatory Visit: Payer: BC Managed Care – PPO | Attending: Sports Medicine | Admitting: Rehabilitative and Restorative Service Providers"

## 2022-09-23 ENCOUNTER — Encounter: Payer: Self-pay | Admitting: Rehabilitative and Restorative Service Providers"

## 2022-09-23 ENCOUNTER — Other Ambulatory Visit: Payer: Self-pay

## 2022-09-23 DIAGNOSIS — M79601 Pain in right arm: Secondary | ICD-10-CM | POA: Diagnosis not present

## 2022-09-23 DIAGNOSIS — S52531D Colles' fracture of right radius, subsequent encounter for closed fracture with routine healing: Secondary | ICD-10-CM | POA: Diagnosis not present

## 2022-09-23 DIAGNOSIS — X58XXXD Exposure to other specified factors, subsequent encounter: Secondary | ICD-10-CM | POA: Insufficient documentation

## 2022-09-23 DIAGNOSIS — R29898 Other symptoms and signs involving the musculoskeletal system: Secondary | ICD-10-CM | POA: Insufficient documentation

## 2022-09-23 DIAGNOSIS — M25631 Stiffness of right wrist, not elsewhere classified: Secondary | ICD-10-CM | POA: Insufficient documentation

## 2022-09-23 DIAGNOSIS — M6281 Muscle weakness (generalized): Secondary | ICD-10-CM | POA: Insufficient documentation

## 2022-09-28 ENCOUNTER — Encounter: Payer: Self-pay | Admitting: Rehabilitative and Restorative Service Providers"

## 2022-09-28 ENCOUNTER — Ambulatory Visit (INDEPENDENT_AMBULATORY_CARE_PROVIDER_SITE_OTHER): Payer: BC Managed Care – PPO | Admitting: Sports Medicine

## 2022-09-28 ENCOUNTER — Encounter: Payer: Self-pay | Admitting: Sports Medicine

## 2022-09-28 ENCOUNTER — Ambulatory Visit: Payer: BC Managed Care – PPO | Admitting: Rehabilitative and Restorative Service Providers"

## 2022-09-28 DIAGNOSIS — M25631 Stiffness of right wrist, not elsewhere classified: Secondary | ICD-10-CM

## 2022-09-28 DIAGNOSIS — M79601 Pain in right arm: Secondary | ICD-10-CM | POA: Diagnosis not present

## 2022-09-28 DIAGNOSIS — S52531D Colles' fracture of right radius, subsequent encounter for closed fracture with routine healing: Secondary | ICD-10-CM | POA: Diagnosis not present

## 2022-09-28 DIAGNOSIS — X58XXXD Exposure to other specified factors, subsequent encounter: Secondary | ICD-10-CM | POA: Diagnosis not present

## 2022-09-28 DIAGNOSIS — M6281 Muscle weakness (generalized): Secondary | ICD-10-CM | POA: Diagnosis not present

## 2022-09-28 DIAGNOSIS — R29898 Other symptoms and signs involving the musculoskeletal system: Secondary | ICD-10-CM | POA: Diagnosis not present

## 2022-09-28 MED ORDER — HYDROCODONE-ACETAMINOPHEN 10-325 MG PO TABS: 1.0000 | ORAL_TABLET | Freq: Three times a day (TID) | ORAL | 0 refills | Status: DC | PRN

## 2022-09-28 NOTE — Therapy (Signed)
OUTPATIENT PHYSICAL THERAPY SHOULDER TREATMENT   Patient Name: [name] MRN: [id number] DOB:09-Dec-1960, 62 y.o., female Today's Date: 09/28/2022  END OF SESSION:  PT End of Session - 09/28/22 1154     Visit Number 2    Number of Visits 16    Date for PT Re-Evaluation 11/18/22    PT Start Time 2094    PT Stop Time 1233    PT Time Calculation (min) 48 min    Activity Tolerance Patient tolerated treatment well             Past Medical History:  Diagnosis Date   Brain aneurysm    COPD (chronic obstructive pulmonary disease) ([place]) 09/05/2014   Per notes from ED.     DDD (degenerative disc disease), lumbar 11/06/2014   Diplopia 09/05/2014   Resolved. Dr. Gwenevere Ghazi.     Former smoker 09/03/2014   Quit date 08/13/14    Hyperlipidemia 09/05/2014   Hypertension    Non-traumatic intracranial subarachnoid hemorrhage ([place])    Pigmentary dispersion syndrome 10/10/2014   Via dr. Gwenevere Ghazi. Given contacts and glasses. Follow up yearly.     Primary osteoarthritis of both hips 11/07/2014   Past Surgical History:  Procedure Laterality Date   BRAIN SURGERY     CARDIAC CATHETERIZATION N/A 07/12/2015   Procedure: Left Heart Cath and Coronary Angiography;  Surgeon: Jettie Booze, MD;  Location: [facility];  Service: Cardiovascular;  Laterality: N/A;   CESAREAN SECTION N/A    IR GENERIC HISTORICAL  10/16/2016   IR ANGIO INTRA EXTRACRAN SEL INTERNAL CAROTID BILAT MOD SED 10/16/2016 Consuella Lose, MD [facility]   IR GENERIC HISTORICAL  10/16/2016   IR ANGIO VERTEBRAL SEL VERTEBRAL BILAT MOD SED 10/16/2016 Consuella Lose, MD [facility]   KIDNEY SURGERY Left    stent   RADIOLOGY WITH ANESTHESIA N/A 08/14/2014   Procedure: Embolization   (RADIOLOGY WITH ANESTHESIA) ;  Surgeon: Medication Radiologist, MD;  Location: [facility];  Service: Radiology;  Laterality: N/A;   Patient Active Problem List   Diagnosis Date Noted   Distal radius fracture, right 08/12/2022    Nontraumatic subarachnoid hemorrhage, unspecified ([place]) 11/02/2017   Chronic nonintractable headache 11/02/2017   Elevated MCV 12/02/2016   Low blood potassium 12/02/2016   Primary osteoarthritis of both hips 11/07/2014   DDD (degenerative disc disease), lumbar 11/06/2014   Pigmentary dispersion syndrome 10/10/2014   Hyperlipidemia 09/05/2014   Diplopia 09/05/2014   COPD (chronic obstructive pulmonary disease) ([place]) 09/05/2014   Essential (primary) hypertension 09/05/2014   Former smoker 09/03/2014   History of intracranial aneurysm 08/13/2014    PCP: Samuel Bouche, NP  REFERRING PROVIDER: Dr Aundria Mems  REFERRING DIAG: Closed Colles' fracture Rt wrist   THERAPY DIAG:  Arm pain, diffuse, right  Other symptoms and signs involving the musculoskeletal system  Stiffness of right wrist joint  Muscle weakness (generalized)  Rationale for Evaluation and Treatment: Rehabilitation  ONSET DATE: 08/10/22  SUBJECTIVE:  SUBJECTIVE STATEMENT:  Patient reports that her arm feels sore but she has noticed less swelling. She has worked on her exercises 2-3 ties per day and is using ice 2-3 times a day. She is also using the Rt UE for more functional activities and feels that she is overdoing it at times resulting in increased pain. She was seen by MD today who has taken her out of work until 3/24.    PERTINENT HISTORY: Patient fell 08/10/22 onto outstretched arm. She was seen in ED and placed in an immobilizer with xrays showing closed Colles' fracture of Rt radius and ulna styloid. Was subsequently placed in a cast for 4 weeks with cast d/c'ed 09/21/22. Brain aneurysm ~ 20+ yrs ago; HTN; neck pain remote past    PAIN:  Are you having pain? Yes: NPRS scale: 6/10 Pain location: Rt wrist and thumb area  Pain  description: aching  Aggravating factors: using arm; twisting wrist Relieving factors: cold; holding wrist area   PRECAUTIONS: Other: s/p fracture Rt radius and ulna 08/10/22  WEIGHT BEARING RESTRICTIONS: post fracture 08/10/22  FALLS:  Has patient fallen in last 6 months? Yes. 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 Redemonstrated slightly comminuted distal radial fracture with intra-articular extension. Interval callus formation though a fracture line persists. No change in alignment. 2. Presumably old ulnar styloid process fracture. 3. Suspected widening of the scapholunate articulation as could be seen of the sequela of ligamentous injury.  PATIENT SURVEYS:  FOTO 33 goal 73  POSTURE: Patient presents with head forward posture with increased thoracic kyphosis; shoulders rounded and elevated; scapulae abducted and rotated along the thoracic spine; head of the humerus anterior in orientation.   UPPER EXTREMITY ROM:    Discomfort or pain with all Rt wrist motions  Active ROM Right eval Left eval  Shoulder flexion    Shoulder extension    Shoulder abduction    Shoulder adduction    Shoulder internal rotation    Shoulder external rotation    Elbow flexion WNL WFL  Elbow extension full full  Wrist flexion 37 70  Wrist extension 38 60  Wrist ulnar deviation 17 43  Wrist radial deviation 25 36  Wrist pronation 84 90  Wrist supination 76 84  (Blank rows = not tested)  UPPER EXTREMITY MMT:   Bilat shoulder strength 5/5    Elbow and wrist not tested   MMT Right eval Left eval  Shoulder flexion    Shoulder extension    Shoulder abduction    Shoulder adduction    Shoulder internal  rotation    Shoulder external rotation    Middle trapezius    Lower trapezius    Elbow flexion    Elbow extension    Wrist flexion    Wrist extension    Wrist ulnar deviation    Wrist radial deviation    Wrist pronation    Wrist supination    Grip strength (lbs)    (Blank rows = not tested)  PALPATION:  Tenderness to palpation Rt distal wrist radial and ulnar wrist   EDEMA:  Mild edema dorsum and radial hand/wrist    TODAY'S TREATMENT:  DATE: 09/28/22: [facility] Adult PT Treatment:                                                 Therapeutic Exercise: sitting with elbow supported on table elbow padded  - bend each joint of each finger to stretch  -bend all finger joints into fist  -fingers straight, rooftop, finger stretch down, fist  -bend wrist forward with fingers relaxed -bend wrist forward with light fist  -bend wrist back with relaxed fingers -bend wrist back with fingers straight  - move wrist to thumb side holding at palm not fingers  -move wrist to little finger side holding at palm not fingers  -wrist circles CW/CCW with support at forearm  Manual Therapy: Retrograde massage Rt hand/wrist/forearm Joint mobs through radius/ulna; wrist; metacarpals gentle - Grade I/II  PROM with gentle end range stretch Rt forearm; wrist; hand  Therapeutic Activity: Gentle gripping - washcloth or fabric (HEP) Modalities: Moist heat Rt wrist x 10 min pre treatment Will ice at home  Self Care: Cautioned to avoid overuse of Rt UE and doing too many or being too aggressive with exercises   DATE: 09/23/22 [facility] Adult PT Treatment:                                                 Therapeutic Exercise: See HEP printed in Medbridge  Manual Therapy: Retrograde massage Rt hand/wrist/forearm Therapeutic Activity: Gentle gripping - washcloth of fabric   Modalities: Will ice at home  Self Care: Cautioned to avoid overuse of Rt UE and doing too many or being too aggressive with exercises    PATIENT EDUCATION: Education details: HEP POC Person educated: Patient Education method: Explanation, Demonstration, Tactile cues, Verbal cues, and Handouts Education comprehension: verbalized understanding, returned demonstration, verbal cues required, tactile cues required, and needs further education  HOME EXERCISE PROGRAM: Access Code: [email or web address] URL: [email or web address] Date: 09/28/2022 Prepared by: Corlis Leak  Program Notes sitting with elbow supported on table elbow padded - bend each joint of each finger to stretch -bend all finger joints into fist -singers straight, rooftop, finger stretch down, fist -bend wrist forward with fingers relaxed-bend wrist forward with light fist -bend wrist back with relaxed fingers-bend wrist back with fingers straight - move wrist to thumb side holding at palm not fingers -move wrist to little finger side holding at palm not fingers -wrist circles CW/CCW with support at forearm   retrograde massage finger tips toward heart ice 3-4 times a day, especially after exercises   ASSESSMENT:  CLINICAL IMPRESSION: Patient returns reporting increased pain from overuse of Rt UE at home and with exercises. Repeated cautions about avoiding overdoing functional activities and exercises which will slow progress and create more pain. Patient verbalizes understanding. Reviewed all exercises. Added circumduction of Rt wrist in clinic and for home. Added gentle joint mobs and continued with gentle PROM to [facility]. Note good improvement in edema Rt hand and thumb area and increased AROM Rt hand and wrist. Progressing well. Again, cautioned to avoid overdoing it with exercises and functional activities. Suggested patient use splint on temporary basis at times during the day to rest wrist and hand as well as at night if  she feels she places  Rt UE in awkward positions in her sleep.   OBJECTIVE IMPAIRMENTS: decreased mobility, decreased ROM, decreased strength, increased fascial restrictions, impaired flexibility, impaired UE functional use, and pain.   GOALS: Goals reviewed with patient? Yes  SHORT TERM GOALS: Target date: 10/21/2022  Independent in initial HEP  Baseline: Goal status: INITIAL  2.  Patient reports using Rt UE for functional activities such as dressing, bathing, etc... Baseline:  Goal status: INITIAL   LONG TERM GOALS: Target date: 11/18/2022  Increase AROM Rt forearm and wrist to within 3-5 degrees of AROM Lt forearm and wrist  Baseline:  Goal status: INITIAL  2.  Pain free ROM Rt forearm and wrist  Baseline:  Goal status: INITIAL  3.  Increased strength Rt forearm; wrist; hand to 4+/5 to 5/5  Baseline:  Goal status: INITIAL  4.  Patient demonstrates and reports return to normal functional use of Rt UE  Baseline:  Goal status: INITIAL  5.  Independent in HEP  Baseline:  Goal status: INITIAL  6.  Improve functional limitation score to 57 Baseline: 33 Goal status: INITIAL  PLAN:  PT FREQUENCY: 2x/week  PT DURATION: 8 weeks  PLANNED INTERVENTIONS: Therapeutic exercises, Therapeutic activity, Neuromuscular re-education, Patient/Family education, Self Care, Joint mobilization, Aquatic Therapy, Dry Needling, Electrical stimulation, Cryotherapy, Moist heat, Taping, Ultrasound, Ionotophoresis 4mg /ml Dexamethasone, Manual therapy, and Re-evaluation  PLAN FOR NEXT SESSION: review and progress with home exercise program; working on posture and alignment; manual work, dry needling, modalities as indicated.    , PT 09/28/2022, 12:33 PM

## 2022-09-28 NOTE — Progress Notes (Signed)
    Procedures performed today:    None.  Independent interpretation of notes and tests performed by another provider:   None.  Brief History, Exam, Impression, and Recommendations:    Distal radius fracture, right This is a very pleasant 62 year old female, she is approximately 7 weeks post distal radius fracture, she has been been in a cast for 4 weeks, back on the 15th of this month we remove the cast, she did have a bit of pain at the fracture, she has had a single session of therapy. X-rays did show good bony callus, she had a bit of neutral radial variance with typical dorsal radial settling, neutral volar tilt. She will continue PT. We filled out disability paperwork today and extended her leave until March 6.    ____________________________________________ Gwen Her. Dianah Field, M.D., ABFM., CAQSM., AME. Primary Care and Sports Medicine [facility]  Adjunct Professor of [employer] [employer]  Risk manager

## 2022-09-28 NOTE — Assessment & Plan Note (Signed)
This is a very pleasant 62 year old female, she is approximately 7 weeks post distal radius fracture, she has been been in a cast for 4 weeks, back on the 15th of this month we remove the cast, she did have a bit of pain at the fracture, she has had a single session of therapy. X-rays did show good bony callus, she had a bit of neutral radial variance with typical dorsal radial settling, neutral volar tilt. She will continue PT. We filled out disability paperwork today and extended her leave until March 6.

## 2022-09-30 ENCOUNTER — Encounter: Payer: Self-pay | Admitting: Rehabilitative and Restorative Service Providers"

## 2022-09-30 ENCOUNTER — Ambulatory Visit: Payer: BC Managed Care – PPO | Admitting: Rehabilitative and Restorative Service Providers"

## 2022-09-30 DIAGNOSIS — R29898 Other symptoms and signs involving the musculoskeletal system: Secondary | ICD-10-CM | POA: Diagnosis not present

## 2022-09-30 DIAGNOSIS — M6281 Muscle weakness (generalized): Secondary | ICD-10-CM | POA: Diagnosis not present

## 2022-09-30 DIAGNOSIS — X58XXXD Exposure to other specified factors, subsequent encounter: Secondary | ICD-10-CM | POA: Diagnosis not present

## 2022-09-30 DIAGNOSIS — M79601 Pain in right arm: Secondary | ICD-10-CM

## 2022-09-30 DIAGNOSIS — S52531D Colles' fracture of right radius, subsequent encounter for closed fracture with routine healing: Secondary | ICD-10-CM | POA: Diagnosis not present

## 2022-09-30 DIAGNOSIS — M25631 Stiffness of right wrist, not elsewhere classified: Secondary | ICD-10-CM | POA: Diagnosis not present

## 2022-09-30 NOTE — Therapy (Signed)
OUTPATIENT PHYSICAL THERAPY SHOULDER TREATMENT   Patient Name: [name] MRN: [id number] DOB:[birth year], 62 y.o., female Today's Date: 09/30/2022  END OF SESSION:  PT End of Session - 09/30/22 1115     Visit Number 3    Number of Visits 16    Date for PT Re-Evaluation 11/18/22    PT Start Time 1100    PT Stop Time 1140    PT Time Calculation (min) 40 min             Past Medical History:  Diagnosis Date   Brain aneurysm    COPD (chronic obstructive pulmonary disease) ([place]) 09/05/2014   Per notes from ED.     DDD (degenerative disc disease), lumbar 11/06/2014   Diplopia 09/05/2014   Resolved. Dr. Gwenevere Ghazi.     Former smoker 09/03/2014   Quit date 08/13/14    Hyperlipidemia 09/05/2014   Hypertension    Non-traumatic intracranial subarachnoid hemorrhage ([place])    Pigmentary dispersion syndrome 10/10/2014   Via dr. Gwenevere Ghazi. Given contacts and glasses. Follow up yearly.     Primary osteoarthritis of both hips 11/07/2014   Past Surgical History:  Procedure Laterality Date   BRAIN SURGERY     CARDIAC CATHETERIZATION N/A 07/12/2015   Procedure: Left Heart Cath and Coronary Angiography;  Surgeon: Jettie Booze, MD;  Location: [facility];  Service: Cardiovascular;  Laterality: N/A;   CESAREAN SECTION N/A    IR GENERIC HISTORICAL  10/16/2016   IR ANGIO INTRA EXTRACRAN SEL INTERNAL CAROTID BILAT MOD SED 10/16/2016 Consuella Lose, MD [facility]   IR GENERIC HISTORICAL  10/16/2016   IR ANGIO VERTEBRAL SEL VERTEBRAL BILAT MOD SED 10/16/2016 Consuella Lose, MD [facility]   KIDNEY SURGERY Left    stent   RADIOLOGY WITH ANESTHESIA N/A 08/14/2014   Procedure: Embolization   (RADIOLOGY WITH ANESTHESIA) ;  Surgeon: Medication Radiologist, MD;  Location: [facility];  Service: Radiology;  Laterality: N/A;   Patient Active Problem List   Diagnosis Date Noted   Distal radius fracture, right 08/12/2022   Nontraumatic subarachnoid hemorrhage, unspecified ([place])  11/02/2017   Chronic nonintractable headache 11/02/2017   Elevated MCV 12/02/2016   Low blood potassium 12/02/2016   Primary osteoarthritis of both hips 11/07/2014   DDD (degenerative disc disease), lumbar 11/06/2014   Pigmentary dispersion syndrome 10/10/2014   Hyperlipidemia 09/05/2014   Diplopia 09/05/2014   COPD (chronic obstructive pulmonary disease) ([place]) 09/05/2014   Essential (primary) hypertension 09/05/2014   Former smoker 09/03/2014   History of intracranial aneurysm 08/13/2014    PCP: Samuel Bouche, NP  REFERRING PROVIDER: Dr Aundria Mems  REFERRING DIAG: Closed Colles' fracture Rt wrist   THERAPY DIAG:  Arm pain, diffuse, right  Other symptoms and signs involving the musculoskeletal system  Stiffness of right wrist joint  Muscle weakness (generalized)  Rationale for Evaluation and Treatment: Rehabilitation  ONSET DATE: 08/10/22  SUBJECTIVE:  SUBJECTIVE STATEMENT:  Patient reports that her arm feels less soreness and swelling in the wrist and hand.  She has been using the splint more to rest the wrist at night and some during the day. She has worked on her exercises 2-3 ties per day and is using ice 2-3 times a day and is using the Rt UE for more functional activities but trying not to overdo it. She is out of work until 3/24.    PERTINENT HISTORY: Patient fell 08/10/22 onto outstretched arm. She was seen in ED and placed in an immobilizer with xrays showing closed Colles' fracture of Rt radius and ulna styloid. Was subsequently placed in a cast for 4 weeks with cast d/c'ed 09/21/22. Brain aneurysm ~ 20+ yrs ago; HTN; neck pain remote past    PAIN:  Are you having pain? Yes: NPRS scale: 3-4/10 Pain location: Rt wrist and thumb area  Pain description: aching  Aggravating factors:  using arm; twisting wrist Relieving factors: cold; holding wrist area   PRECAUTIONS: Other: s/p fracture Rt radius and ulna 08/10/22  WEIGHT BEARING RESTRICTIONS: post fracture 08/10/22  FALLS:  Has patient fallen in last 6 months? Yes. 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 Redemonstrated slightly comminuted distal radial fracture with intra-articular extension. Interval callus formation though a fracture line persists. No change in alignment. 2. Presumably old ulnar styloid process fracture. 3. Suspected widening of the scapholunate articulation as could be seen of the sequela of ligamentous injury.  PATIENT SURVEYS:  FOTO 33 goal 88  POSTURE: Patient presents with head forward posture with increased thoracic kyphosis; shoulders rounded and elevated; scapulae abducted and rotated along the thoracic spine; head of the humerus anterior in orientation.   UPPER EXTREMITY ROM:    Discomfort or pain with all Rt wrist motions  Active ROM Right eval Left eval  Shoulder flexion    Shoulder extension    Shoulder abduction    Shoulder adduction    Shoulder internal rotation    Shoulder external rotation    Elbow flexion WNL WFL  Elbow extension full full  Wrist flexion 37 70  Wrist extension 38 60  Wrist ulnar deviation 17 43  Wrist radial deviation 25 36  Wrist pronation 84 90  Wrist supination 76 84  (Blank rows = not tested)  UPPER EXTREMITY MMT:   Bilat shoulder strength 5/5    Elbow and wrist not tested   MMT Right eval Left eval  Shoulder flexion    Shoulder extension    Shoulder abduction    Shoulder adduction    Shoulder internal rotation    Shoulder external rotation     Middle trapezius    Lower trapezius    Elbow flexion    Elbow extension    Wrist flexion    Wrist extension    Wrist ulnar deviation    Wrist radial deviation    Wrist pronation    Wrist supination    Grip strength (lbs)    (Blank rows = not tested)  PALPATION:  Tenderness to palpation Rt distal wrist radial and ulnar wrist   EDEMA:  Mild edema dorsum and radial hand/wrist    TODAY'S TREATMENT:  DATE: 09/28/22: [facility] Adult PT Treatment:                                                 Therapeutic Exercise: sitting with elbow supported on table elbow padded  - bend each joint of each finger to stretch  -bend all finger joints into fist  -fingers straight, rooftop, finger stretch down, fist  -bend wrist forward with fingers relaxed -bend wrist forward with light fist  -bend wrist back with relaxed fingers -bend wrist back with fingers straight  - move wrist to thumb side holding at palm not fingers  -move wrist to little finger side holding at palm not fingers  -wrist circles CW/CCW with support at forearm  Prayer stretch 10 sec x 3  Isometric wrist flexion, extension, ulnar deviation, radial deviation 3 sec x 10  Shoulder extension, abduction, adduction green TB x 10 each  Manual Therapy: Retrograde massage Rt hand/wrist/forearm Joint mobs through radius/ulna; wrist; metacarpals gentle - Grade I/II  PROM with gentle end range stretch Rt forearm; wrist; hand  Therapeutic Activity: Gentle gripping - washcloth or fabric (HEP) Bead loops  Modalities: Moist heat Rt wrist x 10 min pre treatment Will ice at home  Self Care: Cautioned to avoid overuse of Rt UE and doing too many or being too aggressive with exercises   DATE: 09/23/22 [facility] Adult PT Treatment:                                                 Therapeutic Exercise: See HEP printed  in [place]  Manual Therapy: Retrograde massage Rt hand/wrist/forearm Therapeutic Activity: Gentle gripping - washcloth of fabric  Modalities: Will ice at home  Self Care: Cautioned to avoid overuse of Rt UE and doing too many or being too aggressive with exercises    PATIENT EDUCATION: Education details: HEP POC Person educated: Patient Education method: Explanation, Demonstration, Tactile cues, Verbal cues, and Handouts Education comprehension: verbalized understanding, returned demonstration, verbal cues required, tactile cues required, and needs further education  HOME EXERCISE PROGRAM: Access Code: [email or web address] URL: [email or web address] Date: 09/30/2022 Prepared by: Gillermo Murdoch  Program Notes sitting with elbow supported on table elbow padded - bend each joint of each finger to stretch -bend all finger joints into fist -singers straight, rooftop, finger stretch down, fist -bend wrist forward with fingers relaxed-bend wrist forward with light fist -bend wrist back with relaxed fingers-bend wrist back with fingers straight - move wrist to thumb side holding at palm not fingers -move wrist to little finger side holding at palm not fingers -wrist circles CW/CCW with support at forearm retrograde massage finger tips toward heart ice 3-4 times a day, especially after exercises   Exercises - Wrist Prayer Stretch at Table  - 2 x daily - 7 x weekly - 1 sets - 3 reps - 10 sec  hold - Seated Isometric Wrist Extension  - 2 x daily - 7 x weekly - 1-2 sets - 10 reps - 3 sec  hold - Seated Isometric Wrist Flexion Supinated with Manual Resistance  - 2 x daily - 7 x weekly - 1-2 sets - 10 reps - 3 sec  hold - Seated Isometric Wrist Radial Deviation with Manual Resistance  -  2 x daily - 7 x weekly - 1 sets - 10 reps - 3 sec  hold - Seated Isometric Wrist Ulnar Deviation with Manual Resistance  - 2 x daily - 7 x weekly - 1 sets - 10 reps - 3 sec  hold  ASSESSMENT:  CLINICAL  IMPRESSION: Patient reports decreased pain with use of splint and trying not to overdo exercises and use of Rt UE.  Repeated cautions about avoiding overdoing functional activities and exercises which will slow progress and create more pain. Reviewed all exercises. Continued gentle joint mobs and continued with gentle PROM to [facility]. No notable edema Rt hand and thumb area as well as continued increased AROM Rt hand and wrist. Added exercises including isometric wrist exercises and shoulder.   OBJECTIVE IMPAIRMENTS: decreased mobility, decreased ROM, decreased strength, increased fascial restrictions, impaired flexibility, impaired UE functional use, and pain.   GOALS: Goals reviewed with patient? Yes  SHORT TERM GOALS: Target date: 10/21/2022  Independent in initial HEP  Baseline: Goal status: INITIAL  2.  Patient reports using Rt UE for functional activities such as dressing, bathing, etc... Baseline:  Goal status: INITIAL   LONG TERM GOALS: Target date: 11/18/2022  Increase AROM Rt forearm and wrist to within 3-5 degrees of AROM Lt forearm and wrist  Baseline:  Goal status: INITIAL  2.  Pain free ROM Rt forearm and wrist  Baseline:  Goal status: INITIAL  3.  Increased strength Rt forearm; wrist; hand to 4+/5 to 5/5  Baseline:  Goal status: INITIAL  4.  Patient demonstrates and reports return to normal functional use of Rt UE  Baseline:  Goal status: INITIAL  5.  Independent in HEP  Baseline:  Goal status: INITIAL  6.  Improve functional limitation score to 57 Baseline: 33 Goal status: INITIAL  PLAN:  PT FREQUENCY: 2x/week  PT DURATION: 8 weeks  PLANNED INTERVENTIONS: Therapeutic exercises, Therapeutic activity, Neuromuscular re-education, Patient/Family education, Self Care, Joint mobilization, Aquatic Therapy, Dry Needling, Electrical stimulation, Cryotherapy, Moist heat, Taping, Ultrasound, Ionotophoresis 4mg /ml Dexamethasone, Manual therapy, and  Re-evaluation  PLAN FOR NEXT SESSION: review and progress with home exercise program; working on posture and alignment; manual work, dry needling, modalities as indicated.    [place], PT 09/30/2022, 11:15 AM

## 2022-10-05 ENCOUNTER — Encounter: Payer: Self-pay | Admitting: Rehabilitative and Restorative Service Providers"

## 2022-10-05 ENCOUNTER — Ambulatory Visit: Payer: BC Managed Care – PPO | Admitting: Rehabilitative and Restorative Service Providers"

## 2022-10-05 DIAGNOSIS — M6281 Muscle weakness (generalized): Secondary | ICD-10-CM

## 2022-10-05 DIAGNOSIS — S52531D Colles' fracture of right radius, subsequent encounter for closed fracture with routine healing: Secondary | ICD-10-CM | POA: Diagnosis not present

## 2022-10-05 DIAGNOSIS — M79601 Pain in right arm: Secondary | ICD-10-CM | POA: Diagnosis not present

## 2022-10-05 DIAGNOSIS — R29898 Other symptoms and signs involving the musculoskeletal system: Secondary | ICD-10-CM | POA: Diagnosis not present

## 2022-10-05 DIAGNOSIS — X58XXXD Exposure to other specified factors, subsequent encounter: Secondary | ICD-10-CM | POA: Diagnosis not present

## 2022-10-05 DIAGNOSIS — M25631 Stiffness of right wrist, not elsewhere classified: Secondary | ICD-10-CM | POA: Diagnosis not present

## 2022-10-05 NOTE — Therapy (Signed)
OUTPATIENT PHYSICAL THERAPY SHOULDER TREATMENT   Patient Name: [name] MRN: [id number] DOB:02-15-1961, 62 y.o., female Today's Date: 10/05/2022  END OF SESSION:  PT End of Session - 10/05/22 1107     Visit Number 4    Number of Visits 16    Date for PT Re-Evaluation 11/18/22    PT Start Time 1105    PT Stop Time 1145    PT Time Calculation (min) 40 min    Activity Tolerance Patient tolerated treatment well             Past Medical History:  Diagnosis Date   Brain aneurysm    COPD (chronic obstructive pulmonary disease) (HCC) 09/05/2014   Per notes from ED.     DDD (degenerative disc disease), lumbar 11/06/2014   Diplopia 09/05/2014   Resolved. Dr. Doristine Section.     Former smoker 09/03/2014   Quit date 08/13/14    Hyperlipidemia 09/05/2014   Hypertension    Non-traumatic intracranial subarachnoid hemorrhage (HCC)    Pigmentary dispersion syndrome 10/10/2014   Via dr. Doristine Section. Given contacts and glasses. Follow up yearly.     Primary osteoarthritis of both hips 11/07/2014   Past Surgical History:  Procedure Laterality Date   BRAIN SURGERY     CARDIAC CATHETERIZATION N/A 07/12/2015   Procedure: Left Heart Cath and Coronary Angiography;  Surgeon: Corky Crafts, MD;  Location: [facility] INVASIVE CV LAB;  Service: Cardiovascular;  Laterality: N/A;   CESAREAN SECTION N/A    IR GENERIC HISTORICAL  10/16/2016   IR ANGIO INTRA EXTRACRAN SEL INTERNAL CAROTID BILAT MOD SED 10/16/2016 [name], MD [facility]-INTERV RAD   IR GENERIC HISTORICAL  10/16/2016   IR ANGIO VERTEBRAL SEL VERTEBRAL BILAT MOD SED 10/16/2016 [name], MD [facility]   KIDNEY SURGERY Left    stent   RADIOLOGY WITH ANESTHESIA N/A 08/14/2014   Procedure: Embolization   (RADIOLOGY WITH ANESTHESIA) ;  Surgeon: Medication Radiologist, MD;  Location: [facility] OR;  Service: Radiology;  Laterality: N/A;   Patient Active Problem List   Diagnosis Date Noted   Distal radius fracture, right 08/12/2022    Nontraumatic subarachnoid hemorrhage, unspecified (HCC) 11/02/2017   Chronic nonintractable headache 11/02/2017   Elevated MCV 12/02/2016   Low blood potassium 12/02/2016   Primary osteoarthritis of both hips 11/07/2014   DDD (degenerative disc disease), lumbar 11/06/2014   Pigmentary dispersion syndrome 10/10/2014   Hyperlipidemia 09/05/2014   Diplopia 09/05/2014   COPD (chronic obstructive pulmonary disease) (HCC) 09/05/2014   Essential (primary) hypertension 09/05/2014   Former smoker 09/03/2014   History of intracranial aneurysm 08/13/2014    PCP: Christen Butter, NP  REFERRING PROVIDER: Dr Rodney Langton  REFERRING DIAG: Closed Colles' fracture Rt wrist   THERAPY DIAG:  Arm pain, diffuse, right  Other symptoms and signs involving the musculoskeletal system  Stiffness of right wrist joint  Muscle weakness (generalized)  Rationale for Evaluation and Treatment: Rehabilitation  ONSET DATE: 08/10/22  SUBJECTIVE:  SUBJECTIVE STATEMENT:  Patient reports that she is working on her exercises at home and using her UE for more activities around the house. She has some continued soreness but minimal to no swelling in the wrist and hand.  She has not been using the splint for rest the wrist at night or during the day. She has worked on her exercises 2-3 times per day and is using ice 2 times a day and is using the Rt UE for more functional activities but trying not to overdo it. She is out of work until 3/24.    PERTINENT HISTORY: Patient fell 08/10/22 onto outstretched arm. She was seen in ED and placed in an immobilizer with xrays showing closed Colles' fracture of Rt radius and ulna styloid. Was subsequently placed in a cast for 4 weeks with cast d/c'ed 09/21/22. Brain aneurysm ~ 20+ yrs ago; HTN; neck pain  remote past    PAIN:  Are you having pain? Yes: NPRS scale: 3-4/10 Pain location: Rt wrist   Pain description: aching  Aggravating factors: using arm; twisting wrist Relieving factors: cold; holding wrist area   PRECAUTIONS: Other: s/p fracture Rt radius and ulna 08/10/22  WEIGHT BEARING RESTRICTIONS: post fracture 08/10/22  FALLS:  Has patient fallen in last 6 months? Yes. 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 Redemonstrated slightly comminuted distal radial fracture with intra-articular extension. Interval callus formation though a fracture line persists. No change in alignment. 2. Presumably old ulnar styloid process fracture. 3. Suspected widening of the scapholunate articulation as could be seen of the sequela of ligamentous injury.  PATIENT SURVEYS:  FOTO 33 goal 15  POSTURE: Patient presents with head forward posture with increased thoracic kyphosis; shoulders rounded and elevated; scapulae abducted and rotated along the thoracic spine; head of the humerus anterior in orientation.   UPPER EXTREMITY ROM:    Discomfort or pain with all Rt wrist motions  Active ROM Right eval Left eval  Shoulder flexion    Shoulder extension    Shoulder abduction    Shoulder adduction    Shoulder internal rotation    Shoulder external rotation    Elbow flexion WNL WFL  Elbow extension full full  Wrist flexion 37 70  Wrist extension 38 60  Wrist ulnar deviation 17 43  Wrist radial deviation 25 36  Wrist pronation 84 90  Wrist supination 76 84  (Blank rows = not tested)  UPPER EXTREMITY MMT:   Bilat shoulder strength 5/5    Elbow and wrist not tested   MMT Right eval Left eval  Shoulder  flexion    Shoulder extension    Shoulder abduction    Shoulder adduction    Shoulder internal rotation    Shoulder external rotation    Middle trapezius    Lower trapezius    Elbow flexion    Elbow extension    Wrist flexion    Wrist extension    Wrist ulnar deviation    Wrist radial deviation    Wrist pronation    Wrist supination    Grip strength (lbs)    (Blank rows = not tested)  PALPATION:  Tenderness to palpation Rt distal wrist radial and ulnar  wrist   EDEMA:  Mild edema dorsum and radial hand/wrist    TODAY'S TREATMENT:                                                                                                                                         DATE: 10/05/22: [facility] Adult PT Treatment:                                                 Therapeutic Exercise: sitting with elbow supported on table elbow padded  - bend each joint of each finger to stretch  -bend all finger joints into fist x1 -fingers straight, rooftop, finger stretch down, fist x1 -AAROM Rt wrist 5 sec x 5 -AAROM Rt ext ext 5 sec x 5  -AAROM Rt ulnar deviation 5 sec x 5 -AAROM Rt radial deviation 5 sec x 5ingers  -wrist circles CW/CCW with support at forearm  Prayer stretch 10 sec x 3  Isometric wrist flexion, extension, ulnar deviation, radial deviation 3 sec x 10  Wrist flexion, extension, radial deviation, ulnar deviation;supination/pronation 2# x 10   Manual Therapy: Retrograde massage Rt hand/wrist/forearm Joint mobs through radius/ulna; wrist; metacarpals gentle - Grade I/II  PROM with gentle end range stretch Rt forearm; wrist; hand  Therapeutic Activity:  Modalities: Moist heat Rt wrist x 10 min pre treatment Will ice at home  Self Care: Cautioned to avoid overuse of Rt UE and doing too many or being too aggressive with exercises   DATE: 09/28/22: [facility] Adult PT Treatment:                                                 Therapeutic Exercise: sitting with elbow supported on table  elbow padded  - bend each joint of each finger to stretch  -bend all finger joints into fist  -fingers straight, rooftop, finger stretch down, fist  -bend wrist forward with fingers relaxed -bend wrist forward with light fist  -bend wrist back with relaxed fingers -bend wrist back with fingers straight  - move wrist to thumb side holding at palm not fingers  -move wrist to little finger side holding at palm not fingers  -wrist circles CW/CCW with support at forearm  Prayer stretch 10 sec x 3  Isometric wrist flexion, extension, ulnar deviation, radial deviation 3 sec x 10  Shoulder extension, abduction, adduction green TB x 10 each  Manual Therapy: Retrograde massage Rt hand/wrist/forearm Joint mobs through radius/ulna; wrist; metacarpals gentle - Grade I/II  PROM with gentle end range stretch Rt forearm; wrist; hand  Therapeutic Activity: Gentle gripping - washcloth or fabric (HEP) Bead loops  Modalities: Moist heat Rt wrist x 10 min pre treatment Will ice at home  Self Care: Cautioned to avoid overuse of Rt UE and doing too many or being too aggressive with exercises    PATIENT EDUCATION: Education details: HEP POC Person educated: Patient Education method: Explanation, Demonstration, Tactile cues, Verbal cues, and Handouts Education comprehension: verbalized understanding, returned demonstration, verbal cues required, tactile cues required, and needs further education  HOME EAccess Code: [email or web address] URL: [email or web address] Date: 10/05/2022 Prepared by: Gillermo Murdoch  Program Notes sitting with elbow supported on table elbow padded - bend each joint of each finger to stretch -bend all finger joints into fist -singers straight, rooftop, finger stretch down, fist -bend wrist forward with fingers relaxed-bend wrist forward with light fist -bend wrist back with relaxed fingers-bend wrist back with fingers straight - move wrist to thumb side holding at palm not fingers  -move wrist to little finger side holding at palm not fingers -wrist circles CW/CCW with support at forearm retrograde massage finger tips toward heart ice 3-4 times a day, especially after exercises   Exercises - Wrist Prayer Stretch at Table  - 2 x daily - 7 x weekly - 1 sets - 3 reps - 10 sec  hold - Seated Isometric Wrist Extension  - 2 x daily - 7 x weekly - 1-2 sets - 10 reps - 3 sec  hold - Seated Isometric Wrist Flexion Supinated with Manual Resistance  - 2 x daily - 7 x weekly - 1-2 sets - 10 reps - 3 sec  hold - Seated Isometric Wrist Radial Deviation with Manual Resistance  - 2 x daily - 7 x weekly - 1 sets - 10 reps - 3 sec  hold - Seated Isometric Wrist Ulnar Deviation with Manual Resistance  - 2 x daily - 7 x weekly - 1 sets - 10 reps - 3 sec  hold - Seated Wrist Flexion with Dumbbell  - 1 x daily - 7 x weekly - 1-2 sets - 10 reps - 2 sec  hold - Seated Wrist Extension with Dumbbell  - 1 x daily - 7 x weekly - 1-2 sets - 10 reps - 3 sec  hold - Seated Wrist Radial Deviation with Dumbbell  - 1 x daily - 7 x weekly - 1 sets - 10 reps - 3 sec  hold - Seated Wrist Ulnar Deviation with Dumbbell  - 1 x daily - 7 x weekly - 1 sets - 10 reps - 3 sec  hold - Seated Single Arm Bicep Curls with Rotation and Dumbbell  - 1 x daily - 7 x weekly - 1 sets - 10 reps - 3 sec  hold - Seated Pronation Supination with Dumbbell  - 1 x daily - 7 x weekly - 1 sets - 10 reps - 3 sec  hold - Forearm Pronation with Dumbbell  - 1 x daily - 7 x weekly - 1 sets - 10 reps - 3 sec  holdXERCISE PROGRAM:  ASSESSMENT:  CLINICAL IMPRESSION: Patient reports decreased pain in Rt UE; mostly some soreness.  Continued reminders to avoid overdoing functional activities and exercises which will slow progress and create more pain. Reviewed exercises. Continued gentle joint mobs and continued with gentle PROM to [facility]. Added exercises for strengthening wrist exercises and shoulder.   OBJECTIVE IMPAIRMENTS: decreased mobility,  decreased ROM, decreased strength, increased fascial restrictions, impaired flexibility, impaired UE functional use, and pain.   GOALS: Goals reviewed with patient? Yes  SHORT TERM  GOALS: Target date: 10/21/2022  Independent in initial HEP  Baseline: Goal status: INITIAL  2.  Patient reports using Rt UE for functional activities such as dressing, bathing, etc... Baseline:  Goal status: INITIAL   LONG TERM GOALS: Target date: 11/18/2022  Increase AROM Rt forearm and wrist to within 3-5 degrees of AROM Lt forearm and wrist  Baseline:  Goal status: INITIAL  2.  Pain free ROM Rt forearm and wrist  Baseline:  Goal status: INITIAL  3.  Increased strength Rt forearm; wrist; hand to 4+/5 to 5/5  Baseline:  Goal status: INITIAL  4.  Patient demonstrates and reports return to normal functional use of Rt UE  Baseline:  Goal status: INITIAL  5.  Independent in HEP  Baseline:  Goal status: INITIAL  6.  Improve functional limitation score to 57 Baseline: 33 Goal status: INITIAL  PLAN:  PT FREQUENCY: 2x/week  PT DURATION: 8 weeks  PLANNED INTERVENTIONS: Therapeutic exercises, Therapeutic activity, Neuromuscular re-education, Patient/Family education, Self Care, Joint mobilization, Aquatic Therapy, Dry Needling, Electrical stimulation, Cryotherapy, Moist heat, Taping, Ultrasound, Ionotophoresis 4mg /ml Dexamethasone, Manual therapy, and Re-evaluation  PLAN FOR NEXT SESSION: review and progress with home exercise program; working on posture and alignment; manual work, dry needling, modalities as indicated.    Blair, PT 10/05/2022, 11:08 AM

## 2022-10-07 ENCOUNTER — Ambulatory Visit: Payer: BC Managed Care – PPO | Admitting: Rehabilitative and Restorative Service Providers"

## 2022-10-07 ENCOUNTER — Encounter: Payer: Self-pay | Admitting: Rehabilitative and Restorative Service Providers"

## 2022-10-07 DIAGNOSIS — M25631 Stiffness of right wrist, not elsewhere classified: Secondary | ICD-10-CM

## 2022-10-07 DIAGNOSIS — M79601 Pain in right arm: Secondary | ICD-10-CM

## 2022-10-07 DIAGNOSIS — R29898 Other symptoms and signs involving the musculoskeletal system: Secondary | ICD-10-CM | POA: Diagnosis not present

## 2022-10-07 DIAGNOSIS — M6281 Muscle weakness (generalized): Secondary | ICD-10-CM

## 2022-10-07 DIAGNOSIS — S52531D Colles' fracture of right radius, subsequent encounter for closed fracture with routine healing: Secondary | ICD-10-CM | POA: Diagnosis not present

## 2022-10-07 DIAGNOSIS — X58XXXD Exposure to other specified factors, subsequent encounter: Secondary | ICD-10-CM | POA: Diagnosis not present

## 2022-10-07 NOTE — Therapy (Signed)
OUTPATIENT PHYSICAL THERAPY SHOULDER TREATMENT   Patient Name: [name] MRN: [id number] DOB:04-Nov-1960, 62 y.o., female Today's Date: 10/07/2022  END OF SESSION:  PT End of Session - 10/07/22 1155     Visit Number 5    Number of Visits 16    Date for PT Re-Evaluation 11/18/22    PT Start Time 1740    PT Stop Time 1229    PT Time Calculation (min) 38 min    Activity Tolerance Patient tolerated treatment well    Behavior During Therapy [facility] for tasks assessed/performed             Past Medical History:  Diagnosis Date   Brain aneurysm    COPD (chronic obstructive pulmonary disease) ([place]) 09/05/2014   Per notes from ED.     DDD (degenerative disc disease), lumbar 11/06/2014   Diplopia 09/05/2014   Resolved. Dr. Gwenevere Ghazi.     Former smoker 09/03/2014   Quit date 08/13/14    Hyperlipidemia 09/05/2014   Hypertension    Non-traumatic intracranial subarachnoid hemorrhage ([place])    Pigmentary dispersion syndrome 10/10/2014   Via dr. Gwenevere Ghazi. Given contacts and glasses. Follow up yearly.     Primary osteoarthritis of both hips 11/07/2014   Past Surgical History:  Procedure Laterality Date   BRAIN SURGERY     CARDIAC CATHETERIZATION N/A 07/12/2015   Procedure: Left Heart Cath and Coronary Angiography;  Surgeon: Jettie Booze, MD;  Location: [facility] CV LAB;  Service: Cardiovascular;  Laterality: N/A;   CESAREAN SECTION N/A    IR GENERIC HISTORICAL  10/16/2016   IR ANGIO INTRA EXTRACRAN SEL INTERNAL CAROTID BILAT MOD SED 10/16/2016 Consuella Lose, MD [facility]INTER[facility] RAD   IR GENERIC HISTORICAL  10/16/2016   IR ANGIO VERTEBRAL SEL VERTEBRAL BILAT MOD SED 10/16/2016 Consuella Lose, MD [facility]   KIDNEY SURGERY Left    stent   RADIOLOGY WITH ANESTHESIA N/A 08/14/2014   Procedure: Embolization   (RADIOLOGY WITH ANESTHESIA) ;  Surgeon: Medication Radiologist, MD;  Location: [place];  Service: Radiology;  Laterality: N/A;   Patient Active Problem List   Diagnosis Date  Noted   Distal radius fracture, right 08/12/2022   Nontraumatic subarachnoid hemorrhage, unspecified ([place]) 11/02/2017   Chronic nonintractable headache 11/02/2017   Elevated MCV 12/02/2016   Low blood potassium 12/02/2016   Primary osteoarthritis of both hips 11/07/2014   DDD (degenerative disc disease), lumbar 11/06/2014   Pigmentary dispersion syndrome 10/10/2014   Hyperlipidemia 09/05/2014   Diplopia 09/05/2014   COPD (chronic obstructive pulmonary disease) ([place]) 09/05/2014   Essential (primary) hypertension 09/05/2014   Former smoker 09/03/2014   History of intracranial aneurysm 08/13/2014    PCP: Samuel Bouche, NP  REFERRING PROVIDER: Dr Aundria Mems  REFERRING DIAG: Closed Colles' fracture Rt wrist   THERAPY DIAG:  Arm pain, diffuse, right  Other symptoms and signs involving the musculoskeletal system  Stiffness of right wrist joint  Muscle weakness (generalized)  Rationale for Evaluation and Treatment: Rehabilitation  ONSET DATE: 08/10/22  SUBJECTIVE:  SUBJECTIVE STATEMENT: The patient notes that she was able to mop yesterday. She has stopped the isometric exercises since she has weights for the newer exercises at home. She reports mild discomfort 3/10.  Return to work date: Reported 11/10/22    PERTINENT HISTORY: Patient fell 08/10/22 onto outstretched arm. She was seen in ED and placed in an immobilizer with xrays showing closed Colles' fracture of Rt radius and ulna styloid. Was subsequently placed in a cast for 4 weeks with cast d/c'ed 09/21/22. Brain aneurysm ~ 20+ yrs ago; HTN; neck pain remote past    PAIN:  Are you having pain? Yes: NPRS scale: 3/10 Pain location: Rt wrist   Pain description: aching  Aggravating factors: using arm; twisting wrist Relieving factors: cold;  holding wrist area   PRECAUTIONS: Other: s/p fracture Rt radius and ulna 08/10/22  WEIGHT BEARING RESTRICTIONS: post fracture 08/10/22  FALLS:  Has patient fallen in last 6 months? Yes. 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 Redemonstrated slightly comminuted distal radial fracture with intra-articular extension. Interval callus formation though a fracture line persists. No change in alignment. 2. Presumably old ulnar styloid process fracture. 3. Suspected widening of the scapholunate articulation as could be seen of the sequela of ligamentous injury.  PATIENT SURVEYS:  FOTO 33 goal 26  POSTURE: Patient presents with head forward posture with increased thoracic kyphosis; shoulders rounded and elevated; scapulae abducted and rotated along the thoracic spine; head of the humerus anterior in orientation.   UPPER EXTREMITY ROM:    Discomfort or pain with all Rt wrist motions  Active ROM Right eval Left eval Right 10/07/22  Elbow flexion WNL WFL   Elbow extension full full   Wrist flexion 37 70 42  Wrist extension 38 60 60  Wrist ulnar deviation 17 43   Wrist radial deviation 25 36   Wrist pronation 84 90   Wrist supination 76 84   (Blank rows = not tested)  UPPER EXTREMITY MMT:   Bilat shoulder strength 5/5    Elbow and wrist not tested  PALPATION:  Tenderness to palpation Rt distal wrist radial and ulnar wrist   EDEMA:  Mild edema dorsum and radial hand/wrist    [facility] Adult PT Treatment:                                                DATE: 10/07/22 Therapeutic Exercise: UBE x 30 seconds forward, and 30 seconds backwards level 1 AROM/PROM wrist flexion/extension with overpressure to  tolerance Prayer stretch standing with gentle self mobilization Wrist flexion stretch (phalen's test position) to tolerance Neural glides: ulnar glide and radial glide in standing R UE to tolerance Rolling ball on wall with R UE x 1 minute Wall lean/push up within tolerable range with cues to avoid R shoulder elevation Velcro board with gross grip (long handle) on narrow strip Velcro board with pincher (key) grip on narrow strip Manual Therapy: STM wrist flexors/extensions Gentle mobilization of carpals and ulna/radius grade I-II PROM with overpressure into flexion/extension and supination  TREATMENT:  DATE: 10/05/22: [facility] Adult PT Treatment:                                                 Therapeutic Exercise: sitting with elbow supported on table elbow padded  - bend each joint of each finger to stretch  -bend all finger joints into fist x1 -fingers straight, rooftop, finger stretch down, fist x1 -AAROM Rt wrist 5 sec x 5 -AAROM Rt ext ext 5 sec x 5  -AAROM Rt ulnar deviation 5 sec x 5 -AAROM Rt radial deviation 5 sec x 5ingers  -wrist circles CW/CCW with support at forearm  Prayer stretch 10 sec x 3  Isometric wrist flexion, extension, ulnar deviation, radial deviation 3 sec x 10  Wrist flexion, extension, radial deviation, ulnar deviation;supination/pronation 2# x 10   Manual Therapy: Retrograde massage Rt hand/wrist/forearm Joint mobs through radius/ulna; wrist; metacarpals gentle - Grade I/II  PROM with gentle end range stretch Rt forearm; wrist; hand  Modalities: Moist heat Rt wrist x 10 min pre treatment Will ice at home  Self Care: Cautioned to avoid overuse of Rt UE and doing too many or being too aggressive with exercises   PATIENT EDUCATION: Education details: HEP POC Person educated: Patient Education method: Explanation, Demonstration, Tactile cues, Verbal cues, and Handouts Education comprehension: verbalized understanding, returned demonstration, verbal  cues required, tactile cues required, and needs further education  Access Code: [email or web address] URL: [email or web address] Date: 10/07/2022 Prepared by: Rudell Cobb  Program Notes sitting with elbow supported on table elbow padded - bend each joint of each finger to stretch -bend all finger joints into fist -singers straight, rooftop, finger stretch down, fist -bend wrist forward with fingers relaxed-bend wrist forward with light fist -bend wrist back with relaxed fingers-bend wrist back with fingers straight - move wrist to thumb side holding at palm not fingers -move wrist to little finger side holding at palm not fingers -wrist circles CW/CCW with support at forearm retrograde massage finger tips toward heart ice 3-4 times a day, especially after exercises   Exercises - Wrist Prayer Stretch at Table  - 2 x daily - 7 x weekly - 1 sets - 3 reps - 10 sec  hold - Seated Wrist Flexion with Dumbbell  - 1 x daily - 7 x weekly - 1-2 sets - 10 reps - 2 sec  hold - Seated Wrist Extension with Dumbbell  - 1 x daily - 7 x weekly - 1-2 sets - 10 reps - 3 sec  hold - Seated Wrist Radial Deviation with Dumbbell  - 1 x daily - 7 x weekly - 1 sets - 10 reps - 3 sec  hold - Seated Wrist Ulnar Deviation with Dumbbell  - 1 x daily - 7 x weekly - 1 sets - 10 reps - 3 sec  hold - Seated Single Arm Bicep Curls with Rotation and Dumbbell  - 1 x daily - 7 x weekly - 1 sets - 10 reps - 3 sec  hold - Seated Pronation Supination with Dumbbell  - 1 x daily - 7 x weekly - 1 sets - 10 reps - 3 sec  hold - Forearm Pronation with Dumbbell  - 1 x daily - 7 x weekly - 1 sets - 10 reps - 3 sec  hold  ASSESSMENT:  CLINICAL IMPRESSION: Patient notes increased functional use of  the R UE and dec'ing pain. She is performing HEP with weights to tolerance. Plan to progress to loading as tolerated.   OBJECTIVE IMPAIRMENTS: decreased mobility, decreased ROM, decreased strength, increased fascial restrictions, impaired  flexibility, impaired UE functional use, and pain.   GOALS: Goals reviewed with patient? Yes  SHORT TERM GOALS: Target date: 10/21/2022  Independent in initial HEP  Baseline: Goal status: MET on 10/07/22  2.  Patient reports using Rt UE for functional activities such as dressing, bathing, etc... Baseline:  Goal status: MET  10/07/22 reporting use of R UE with brushing hair, etc.   LONG TERM GOALS: Target date: 11/18/2022  Increase AROM Rt forearm and wrist to within 3-5 degrees of AROM Lt forearm and wrist  Baseline:  Goal status: IN PROGRESS  2.  Pain free ROM Rt forearm and wrist  Baseline:  Goal status:IN PROGRESS  3.  Increased strength Rt forearm; wrist; hand to 4+/5 to 5/5  Baseline:  Goal status: IN PROGRESS  4.  Patient demonstrates and reports return to normal functional use of Rt UE  Baseline:  Goal status: IN PROGRESS  5.  Independent in HEP  Baseline:  Goal status: IN PROGRESS  6.  Improve functional limitation score to 57 Baseline: 33 Goal status: IN PROGRESS  PLAN:  PT FREQUENCY: 2x/week  PT DURATION: 8 weeks  PLANNED INTERVENTIONS: Therapeutic exercises, Therapeutic activity, Neuromuscular re-education, Patient/Family education, Self Care, Joint mobilization, Aquatic Therapy, Dry Needling, Electrical stimulation, Cryotherapy, Moist heat, Taping, Ultrasound, Ionotophoresis 4mg /ml Dexamethasone, Manual therapy, and Re-evaluation  PLAN FOR NEXT SESSION: review and progress with home exercise program; working on posture and alignment; manual work, dry needling, modalities as indicated.    [place], PT 10/07/2022, 12:48 PM

## 2022-10-09 ENCOUNTER — Ambulatory Visit (INDEPENDENT_AMBULATORY_CARE_PROVIDER_SITE_OTHER): Payer: BC Managed Care – PPO | Admitting: Sports Medicine

## 2022-10-09 DIAGNOSIS — S52531D Colles' fracture of right radius, subsequent encounter for closed fracture with routine healing: Secondary | ICD-10-CM

## 2022-10-09 NOTE — Progress Notes (Signed)
    Procedures performed today:    None.  Independent interpretation of notes and tests performed by another provider:   None.  Brief History, Exam, Impression, and Recommendations:    Distal radius fracture, right This is a very pleasant 62 year old female, she had a distal radius fracture, approximately 8 weeks ago, she was in a cast for 4 weeks, she has now been out of the cast for almost 2 weeks. She is doing really well with physical therapy, x-rays showed good bony callus with neutral radial variance, neutral volar tilt but good inclination. No signs of ulnar abutment syndrome today. Continue therapy, return to work date is currently set at March 6 so I will see her back a few days before that.    ____________________________________________ Gwen Her. Dianah Field, M.D., ABFM., CAQSM., AME. Primary Care and Sports Medicine [facility]  Adjunct Professor of [employer]  Risk manager

## 2022-10-09 NOTE — Assessment & Plan Note (Signed)
This is a very pleasant 62 year old female, she had a distal radius fracture, approximately 8 weeks ago, she was in a cast for 4 weeks, she has now been out of the cast for almost 2 weeks. She is doing really well with physical therapy, x-rays showed good bony callus with neutral radial variance, neutral volar tilt but good inclination. No signs of ulnar abutment syndrome today. Continue therapy, return to work date is currently set at March 6 so I will see her back a few days before that.

## 2022-10-12 ENCOUNTER — Encounter: Payer: Self-pay | Admitting: Rehabilitative and Restorative Service Providers"

## 2022-10-12 ENCOUNTER — Ambulatory Visit: Payer: BC Managed Care – PPO | Attending: Sports Medicine | Admitting: Rehabilitative and Restorative Service Providers"

## 2022-10-12 DIAGNOSIS — M25631 Stiffness of right wrist, not elsewhere classified: Secondary | ICD-10-CM | POA: Diagnosis present

## 2022-10-12 DIAGNOSIS — M6281 Muscle weakness (generalized): Secondary | ICD-10-CM | POA: Diagnosis present

## 2022-10-12 DIAGNOSIS — R29898 Other symptoms and signs involving the musculoskeletal system: Secondary | ICD-10-CM | POA: Insufficient documentation

## 2022-10-12 DIAGNOSIS — M79601 Pain in right arm: Secondary | ICD-10-CM | POA: Insufficient documentation

## 2022-10-12 NOTE — Therapy (Signed)
OUTPATIENT PHYSICAL THERAPY SHOULDER TREATMENT   Patient Name: [name] MRN: [id number] DOB:1961/05/31, 62 y.o., female Today's Date: 10/12/2022  END OF SESSION:  PT End of Session - 10/12/22 1110     Visit Number 6    Number of Visits 16    Date for PT Re-Evaluation 11/18/22    PT Start Time 1103    PT Stop Time 6948    PT Time Calculation (min) 45 min    Activity Tolerance Patient tolerated treatment well             Past Medical History:  Diagnosis Date   Brain aneurysm    COPD (chronic obstructive pulmonary disease) ([place]) 09/05/2014   Per notes from ED.     DDD (degenerative disc disease), lumbar 11/06/2014   Diplopia 09/05/2014   Resolved. Dr. Gwenevere Ghazi.     Former smoker 09/03/2014   Quit date 08/13/14    Hyperlipidemia 09/05/2014   Hypertension    Non-traumatic intracranial subarachnoid hemorrhage ([place])    Pigmentary dispersion syndrome 10/10/2014   Via dr. Gwenevere Ghazi. Given contacts and glasses. Follow up yearly.     Primary osteoarthritis of both hips 11/07/2014   Past Surgical History:  Procedure Laterality Date   BRAIN SURGERY     CARDIAC CATHETERIZATION N/A 07/12/2015   Procedure: Left Heart Cath and Coronary Angiography;  Surgeon: Jettie Booze, MD;  Location: [facility];  Service: Cardiovascular;  Laterality: N/A;   CESAREAN SECTION N/A    IR GENERIC HISTORICAL  10/16/2016   IR ANGIO INTRA EXTRACRAN SEL INTERNAL CAROTID BILAT MOD SED 10/16/2016 Consuella Lose, MD [facility]   IR GENERIC HISTORICAL  10/16/2016   IR ANGIO VERTEBRAL SEL VERTEBRAL BILAT MOD SED 10/16/2016 Consuella Lose, MD [facility]   KIDNEY SURGERY Left    stent   RADIOLOGY WITH ANESTHESIA N/A 08/14/2014   Procedure: Embolization   (RADIOLOGY WITH ANESTHESIA) ;  Surgeon: Medication Radiologist, MD;  Location: [facility];  Service: Radiology;  Laterality: N/A;   Patient Active Problem List   Diagnosis Date Noted   Distal radius fracture, right 08/12/2022    Nontraumatic subarachnoid hemorrhage, unspecified ([place]) 11/02/2017   Chronic nonintractable headache 11/02/2017   Elevated MCV 12/02/2016   Low blood potassium 12/02/2016   Primary osteoarthritis of both hips 11/07/2014   DDD (degenerative disc disease), lumbar 11/06/2014   Pigmentary dispersion syndrome 10/10/2014   Hyperlipidemia 09/05/2014   Diplopia 09/05/2014   COPD (chronic obstructive pulmonary disease) ([place]) 09/05/2014   Essential (primary) hypertension 09/05/2014   Former smoker 09/03/2014   History of intracranial aneurysm 08/13/2014    PCP: Samuel Bouche, NP  REFERRING PROVIDER: Dr Aundria Mems  REFERRING DIAG: Closed Colles' fracture Rt wrist   THERAPY DIAG:  Arm pain, diffuse, right  Other symptoms and signs involving the musculoskeletal system  Stiffness of right wrist joint  Muscle weakness (generalized)  Rationale for Evaluation and Treatment: Rehabilitation  ONSET DATE: 08/10/22  SUBJECTIVE:  SUBJECTIVE STATEMENT: She saw Dr T Friday and he is pleased with her progress. The patient notes that she continues to gain strength and is using her arm for more activities at home. She helped hold and carry a large bag of dog food yesterday which was difficult. She is using 2# weights for exercises at home. She reports mild discomfort when she overdoes it at home. Return to work date: Reported 11/10/22    PERTINENT HISTORY: Patient fell 08/10/22 onto outstretched arm. She was seen in ED and placed in an immobilizer with xrays showing closed Colles' fracture of Rt radius and ulna styloid. Was subsequently placed in a cast for 4 weeks with cast d/c'ed 09/21/22. Brain aneurysm ~ 20+ yrs ago; HTN; neck pain remote past    PAIN:  Are you having pain? Yes: NPRS scale: 2-3/10 Pain location: Rt  wrist   Pain description: aching  Aggravating factors: using arm; twisting wrist Relieving factors: cold; holding wrist area   PRECAUTIONS: Other: s/p fracture Rt radius and ulna 08/10/22  WEIGHT BEARING RESTRICTIONS: post fracture 08/10/22  FALLS:  Has patient fallen in last 6 months? Yes. 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 Redemonstrated slightly comminuted distal radial fracture with intra-articular extension. Interval callus formation though a fracture line persists. No change in alignment. 2. Presumably old ulnar styloid process fracture. 3. Suspected widening of the scapholunate articulation as could be seen of the sequela of ligamentous injury.  PATIENT SURVEYS:  FOTO 33 goal 31  POSTURE: Patient presents with head forward posture with increased thoracic kyphosis; shoulders rounded and elevated; scapulae abducted and rotated along the thoracic spine; head of the humerus anterior in orientation.   UPPER EXTREMITY ROM:    Discomfort or pain with all Rt wrist motions  Active ROM Right eval Left eval Right 10/07/22  Elbow flexion WNL WFL   Elbow extension full full   Wrist flexion 37 70 42  Wrist extension 38 60 60  Wrist ulnar deviation 17 43   Wrist radial deviation 25 36   Wrist pronation 84 90   Wrist supination 76 84   (Blank rows = not tested)  UPPER EXTREMITY MMT:   Bilat shoulder strength 5/5    Elbow and wrist not tested  PALPATION:  Tenderness to palpation Rt distal wrist radial and ulnar wrist   EDEMA:  Mild edema dorsum and radial hand/wrist    [facility] Adult PT Treatment:                                                DATE:  10/12/21 Therapeutic Exercise: AROM/PROM  wrist flexion/extension with overpressure to tolerance Prayer stretch standing with gentle self mobilization Theraputty yellow - rolling, pinching, pulling, etc Peg board one and two layer pegs on and off Neural glides: ulnar glide and radial glide in standing R UE to tolerance Wall push up small range 10 x 2  Pressing into table flat hand x 5  Tossing 8" ball back and forth  Bouncing 12" ball   Manual Therapy: STM wrist flexors/extensions Mobilization of  carpals and ulna/radius grade II/III PROM with overpressure into flexion/extension and supination Modalities: Moist heat Rt wrist x 10 min pre treatment Will ice at home  Self Care: Cautioned to avoid overuse of Rt UE and doing too many or being too aggressive with exercises   10/07/22 Therapeutic Exercise: UBE x 30 seconds forward, and 30 seconds backwards level 1 AROM/PROM wrist flexion/extension with overpressure to tolerance Prayer stretch standing with gentle self mobilization Wrist flexion stretch (phalen's test position) to tolerance Neural glides: ulnar glide and radial glide in standing R UE to tolerance Rolling ball on wall with R UE x 1 minute Wall lean/push up within tolerable range with cues to avoid R shoulder elevation Velcro board with gross grip (long handle) on narrow strip Velcro board with pincher (key) grip on narrow strip Manual Therapy: STM wrist flexors/extensions Gentle mobilization of carpals and ulna/radius grade I-II PROM with overpressure into flexion/extension and supination   PATIENT EDUCATION: Education details: HEP POC Person educated: Patient Education method: Programmer, multimedia, Demonstration, Tactile cues, Verbal cues, and Handouts Education comprehension: verbalized understanding, returned demonstration, verbal cues required, tactile cues required, and needs further education  Access Code: [email or web address] URL: [email or web address] Date: 10/07/2022 Prepared by: Margretta Ditty  Program Notes sitting with elbow supported on table elbow padded - bend each joint of each finger to stretch -bend all finger joints into fist -singers straight, rooftop, finger stretch down, fist -bend wrist forward with fingers relaxed-bend wrist forward with light fist -bend wrist back with relaxed fingers-bend wrist back with fingers straight - move wrist to thumb side holding at palm not fingers -move wrist to little finger side holding at palm not fingers -wrist circles CW/CCW with support at forearm retrograde massage finger tips toward heart ice 3-4 times a day, especially after exercises   Exercises - Wrist Prayer Stretch at Table  - 2 x daily - 7 x weekly - 1 sets - 3 reps - 10 sec  hold - Seated Wrist Flexion with Dumbbell  - 1 x daily - 7 x weekly - 1-2 sets - 10 reps - 2 sec  hold - Seated Wrist Extension with Dumbbell  - 1 x daily - 7 x weekly - 1-2 sets - 10 reps - 3 sec  hold - Seated Wrist Radial Deviation with Dumbbell  - 1 x daily - 7 x weekly - 1 sets - 10 reps - 3 sec  hold - Seated Wrist Ulnar Deviation with Dumbbell  - 1 x daily - 7 x weekly - 1 sets - 10 reps - 3 sec  hold - Seated Single Arm Bicep Curls with Rotation and Dumbbell  - 1 x daily - 7 x weekly - 1 sets - 10 reps - 3 sec  hold - Seated Pronation Supination with Dumbbell  - 1 x daily - 7 x weekly - 1 sets - 10 reps - 3 sec  hold - Forearm Pronation with Dumbbell  - 1 x daily - 7 x weekly - 1 sets - 10 reps - 3 sec  hold  ASSESSMENT:  CLINICAL IMPRESSION: Patient notes increased functional use of the R UE and decreasing pain. She is performing HEP with weights to tolerance. Plan to continue progressing loading, strengthening and functional activities as tolerated.   OBJECTIVE IMPAIRMENTS: decreased mobility, decreased ROM, decreased strength, increased fascial restrictions, impaired flexibility, impaired UE functional use, and pain.   GOALS: Goals reviewed with patient? Yes  SHORT TERM GOALS: Target  date: 10/21/2022  Independent in initial  HEP  Baseline: Goal status: MET on 10/07/22  2.  Patient reports using Rt UE for functional activities such as dressing, bathing, etc... Baseline:  Goal status: MET  10/07/22 reporting use of R UE with brushing hair, etc.   LONG TERM GOALS: Target date: 11/18/2022  Increase AROM Rt forearm and wrist to within 3-5 degrees of AROM Lt forearm and wrist  Baseline:  Goal status: IN PROGRESS  2.  Pain free ROM Rt forearm and wrist  Baseline:  Goal status:IN PROGRESS  3.  Increased strength Rt forearm; wrist; hand to 4+/5 to 5/5  Baseline:  Goal status: IN PROGRESS  4.  Patient demonstrates and reports return to normal functional use of Rt UE  Baseline:  Goal status: IN PROGRESS  5.  Independent in HEP  Baseline:  Goal status: IN PROGRESS  6.  Improve functional limitation score to 57 Baseline: 33 Goal status: IN PROGRESS  PLAN:  PT FREQUENCY: 2x/week  PT DURATION: 8 weeks  PLANNED INTERVENTIONS: Therapeutic exercises, Therapeutic activity, Neuromuscular re-education, Patient/Family education, Self Care, Joint mobilization, Aquatic Therapy, Dry Needling, Electrical stimulation, Cryotherapy, Moist heat, Taping, Ultrasound, Ionotophoresis 4mg /ml Dexamethasone, Manual therapy, and Re-evaluation  PLAN FOR NEXT SESSION: review and progress with home exercise program; working on posture and alignment; manual work, dry needling, modalities as indicated.    [place], PT 10/12/2022, 11:11 AM

## 2022-10-15 ENCOUNTER — Ambulatory Visit: Payer: BC Managed Care – PPO | Admitting: Rehabilitative and Restorative Service Providers"

## 2022-10-15 ENCOUNTER — Encounter: Payer: Self-pay | Admitting: Rehabilitative and Restorative Service Providers"

## 2022-10-15 DIAGNOSIS — M25631 Stiffness of right wrist, not elsewhere classified: Secondary | ICD-10-CM | POA: Diagnosis not present

## 2022-10-15 DIAGNOSIS — M6281 Muscle weakness (generalized): Secondary | ICD-10-CM

## 2022-10-15 DIAGNOSIS — R29898 Other symptoms and signs involving the musculoskeletal system: Secondary | ICD-10-CM

## 2022-10-15 DIAGNOSIS — M79601 Pain in right arm: Secondary | ICD-10-CM

## 2022-10-15 NOTE — Therapy (Signed)
OUTPATIENT PHYSICAL THERAPY SHOULDER TREATMENT   Patient Name: [name] MRN: [id number] DOB:[birth year], 62 y.o., female Today's Date: 10/15/2022  END OF SESSION:  PT End of Session - 10/15/22 1107     Visit Number 7    Number of Visits 16    Date for PT Re-Evaluation 11/18/22    PT Start Time 1105    PT Stop Time 1145    PT Time Calculation (min) 40 min    Activity Tolerance Patient tolerated treatment well             Past Medical History:  Diagnosis Date   Brain aneurysm    COPD (chronic obstructive pulmonary disease) ([facility]) 09/05/2014   Per notes from ED.     DDD (degenerative disc disease), lumbar 11/06/2014   Diplopia 09/05/2014   Resolved. Dr. Doristine Section.     Former smoker 09/03/2014   Quit date 08/13/14    Hyperlipidemia 09/05/2014   Hypertension    Non-traumatic intracranial subarachnoid hemorrhage ([facility])    Pigmentary dispersion syndrome 10/10/2014   Via dr. Doristine Section. Given contacts and glasses. Follow up yearly.     Primary osteoarthritis of both hips 11/07/2014   Past Surgical History:  Procedure Laterality Date   BRAIN SURGERY     CARDIAC CATHETERIZATION N/A 07/12/2015   Procedure: Left Heart Cath and Coronary Angiography;  Surgeon: Corky Crafts, MD;  Location: [facility];  Service: Cardiovascular;  Laterality: N/A;   CESAREAN SECTION N/A    IR GENERIC HISTORICAL  10/16/2016   IR ANGIO INTRA EXTRACRAN SEL INTERNAL CAROTID BILAT MOD SED 10/16/2016 [name], MD [facility]   IR GENERIC HISTORICAL  10/16/2016   IR ANGIO VERTEBRAL SEL VERTEBRAL BILAT MOD SED 10/16/2016 [name], MD [facility]   KIDNEY SURGERY Left    stent   RADIOLOGY WITH ANESTHESIA N/A 08/14/2014   Procedure: Embolization   (RADIOLOGY WITH ANESTHESIA) ;  Surgeon: Medication Radiologist, MD;  Location[facility];  Service: Radiology;  Laterality: N/A;   Patient Active Problem List   Diagnosis Date Noted   Distal radius fracture, right 08/12/2022    Nontraumatic subarachnoid hemorrhage, unspecified ([facility]) 11/02/2017   Chronic nonintractable headache 11/02/2017   Elevated MCV 12/02/2016   Low blood potassium 12/02/2016   Primary osteoarthritis of both hips 11/07/2014   DDD (degenerative disc disease), lumbar 11/06/2014   Pigmentary dispersion syndrome 10/10/2014   Hyperlipidemia 09/05/2014   Diplopia 09/05/2014   COPD (chronic obstructive pulmonary disease) ([facility]) 09/05/2014   Essential (primary) hypertension 09/05/2014   Former smoker 09/03/2014   History of intracranial aneurysm 08/13/2014    PCP: Christen Butter, NP  REFERRING PROVIDER: Dr Rodney Langton  REFERRING DIAG: Closed Colles' fracture Rt wrist   THERAPY DIAG:  Arm pain, diffuse, right  Other symptoms and signs involving the musculoskeletal system  Stiffness of right wrist joint  Muscle weakness (generalized)  Rationale for Evaluation and Treatment: Rehabilitation  ONSET DATE: 08/10/22  SUBJECTIVE:  SUBJECTIVE STATEMENT: [name] reports continued progress with Rt wrist and hand. She is not really having pain and is using Rt UE for more activities at home.   PERTINENT HISTORY: Patient fell 08/10/22 onto outstretched arm. She was seen in ED and placed in an immobilizer with xrays showing closed Colles' fracture of Rt radius and ulna styloid. Was subsequently placed in a cast for 4 weeks with cast d/c'ed 09/21/22. Brain aneurysm ~ 20+ yrs ago; HTN; neck pain remote past    PAIN:  Are you having pain? Yes: NPRS scale: 1-2/10; intermittently  Pain location: Rt wrist   Pain description: aching  Aggravating factors: using arm; twisting wrist Relieving factors: cold; holding wrist area   PRECAUTIONS: Other: s/p fracture Rt radius and ulna 08/10/22  WEIGHT BEARING RESTRICTIONS: post  fracture 08/10/22  FALLS:  Has patient fallen in last 6 months? Yes. 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 Redemonstrated slightly comminuted distal radial fracture with intra-articular extension. Interval callus formation though a fracture line persists. No change in alignment. 2. Presumably old ulnar styloid process fracture. 3. Suspected widening of the scapholunate articulation as could be seen of the sequela of ligamentous injury.  PATIENT SURVEYS:  FOTO 33 goal 32  POSTURE: Patient presents with head forward posture with increased thoracic kyphosis; shoulders rounded and elevated; scapulae abducted and rotated along the thoracic spine; head of the humerus anterior in orientation.   UPPER EXTREMITY ROM:    Discomfort or pain with all Rt wrist motions  Active ROM Right eval Left eval Right 10/07/22  Elbow flexion WNL WFL   Elbow extension full full   Wrist flexion 37 70 42  Wrist extension 38 60 60  Wrist ulnar deviation 17 43   Wrist radial deviation 25 36   Wrist pronation 84 90   Wrist supination 76 84   (Blank rows = not tested)  UPPER EXTREMITY MMT:   Bilat shoulder strength 5/5    Elbow and wrist not tested  PALPATION:  Tenderness to palpation Rt distal wrist radial and ulnar wrist   EDEMA:  Mild edema dorsum and radial hand/wrist    [facility] Adult PT Treatment:                                                DATE:  10/15/21 Therapeutic Exercise: AROM/PROM wrist flexion/extension with overpressure to tolerance Prayer stretch standing with gentle self mobilization Row green TB 3 sec x 10 x 2  ER green TB 3 sec x 10  IR green Tb 3 sec x 10  Stacking cones counter to shoulder  height shelf  Velcro board w/various rolls  Peg board one and two layer pegs on and off Bouncing large orange ball on wall overhead 1 min x 3  Wall push up small range 10 x 2  Flex bar theraband - yellow for grip and twist   Manual Therapy: STM wrist flexors/extensions Mobilization of carpals and ulna/radius grade II/III PROM with overpressure into flexion/extension and supination Modalities: Moist heat Rt wrist x 10 min pre treatment Will  ice at home  Self Care: Cautioned to avoid overuse of Rt UE and doing too many or being too aggressive with exercises   10/12/21 Therapeutic Exercise: AROM/PROM wrist flexion/extension with overpressure to tolerance Prayer stretch standing with gentle self mobilization Theraputty yellow - rolling, pinching, pulling, etc Peg board one and two layer pegs on and off Neural glides: ulnar glide and radial glide in standing R UE to tolerance Wall push up small range 10 x 2  Pressing into table flat hand x 5  Tossing 8" ball back and forth  Bouncing 12" ball   Manual Therapy: STM wrist flexors/extensions Mobilization of carpals and ulna/radius grade II/III PROM with overpressure into flexion/extension and supination Modalities: Moist heat Rt wrist x 10 min pre treatment Will ice at home  Self Care: Cautioned to avoid overuse of Rt UE and doing too many or being too aggressive with exercises    PATIENT EDUCATION: Education details: HEP POC Person educated: Patient Education method: Explanation, Demonstration, Tactile cues, Verbal cues, and Handouts Education comprehension: verbalized understanding, returned demonstration, verbal cues required, tactile cues required, and needs further education  Access Code: [email or web address] Access Code: [email or web address] URL: [email or web address] Date: 10/15/2022 Prepared by: Gillermo Murdoch  Program Notes sitting with elbow supported on table elbow padded - bend each joint of each Porter to stretch -bend all  Porter joints into fist -singers straight, rooftop, Porter stretch down, fist -bend wrist forward with fingers relaxed-bend wrist forward with light fist -bend wrist back with relaxed fingers-bend wrist back with fingers straight - move wrist to thumb side holding at palm not fingers -move wrist to little Porter side holding at palm not fingers -wrist circles CW/CCW with support at forearm retrograde massage Porter tips toward heart ice 3-4 times a day, especially after exercises   Exercises - Wrist Prayer Stretch at Table  - 2 x daily - 7 x weekly - 1 sets - 3 reps - 10 sec  hold - Seated Wrist Flexion with Dumbbell  - 1 x daily - 7 x weekly - 1-2 sets - 10 reps - 2 sec  hold - Seated Wrist Extension with Dumbbell  - 1 x daily - 7 x weekly - 1-2 sets - 10 reps - 3 sec  hold - Seated Wrist Radial Deviation with Dumbbell  - 1 x daily - 7 x weekly - 1 sets - 10 reps - 3 sec  hold - Seated Wrist Ulnar Deviation with Dumbbell  - 1 x daily - 7 x weekly - 1 sets - 10 reps - 3 sec  hold - Seated Single Arm Bicep Curls with Rotation and Dumbbell  - 1 x daily - 7 x weekly - 1 sets - 10 reps - 3 sec  hold - Seated Pronation Supination with Dumbbell  - 1 x daily - 7 x weekly - 1 sets - 10 reps - 3 sec  hold - Forearm Pronation with Dumbbell  - 1 x daily - 7 x weekly - 1 sets - 10 reps - 3 sec  hold - Standing Bilateral Low Shoulder Row with Anchored Resistance  - 2 x daily - 7 x weekly - 1-3 sets - 10 reps - 2-3 sec  hold - Shoulder External Rotation with Anchored Resistance  - 2 x daily - 7 x weekly - 1-2 sets - 10 reps - 3 sec  hold - Shoulder Internal Rotation with Resistance  - 2 x daily - 7 x weekly - 2 sets - 10 reps -  3 sec  hold  ASSESSMENT:  CLINICAL IMPRESSION: Patient notes increased functional use of the R UE and decreasing pain. She is performing HEP with weights to tolerance. Plan to continue progressing loading, strengthening and functional activities as tolerated. RTD 11/09/22. Assess grip  strength and ROM at next visit.   OBJECTIVE IMPAIRMENTS: decreased mobility, decreased ROM, decreased strength, increased fascial restrictions, impaired flexibility, impaired UE functional use, and pain.   GOALS: Goals reviewed with patient? Yes  SHORT TERM GOALS: Target date: 10/21/2022  Independent in initial HEP  Baseline: Goal status: MET on 10/07/22  2.  Patient reports using Rt UE for functional activities such as dressing, bathing, etc... Baseline:  Goal status: MET  10/07/22 reporting use of R UE with brushing hair, etc.   LONG TERM GOALS: Target date: 11/18/2022  Increase AROM Rt forearm and wrist to within 3-5 degrees of AROM Lt forearm and wrist  Baseline:  Goal status: IN PROGRESS  2.  Pain free ROM Rt forearm and wrist  Baseline:  Goal status:IN PROGRESS  3.  Increased strength Rt forearm; wrist; hand to 4+/5 to 5/5  Baseline:  Goal status: IN PROGRESS  4.  Patient demonstrates and reports return to normal functional use of Rt UE  Baseline:  Goal status: IN PROGRESS  5.  Independent in HEP  Baseline:  Goal status: IN PROGRESS  6.  Improve functional limitation score to 57 Baseline: 33 Goal status: IN PROGRESS  PLAN:  PT FREQUENCY: 2x/week  PT DURATION: 8 weeks  PLANNED INTERVENTIONS: Therapeutic exercises, Therapeutic activity, Neuromuscular re-education, Patient/Family education, Self Care, Joint mobilization, Aquatic Therapy, Dry Needling, Electrical stimulation, Cryotherapy, Moist heat, Taping, Ultrasound, Ionotophoresis 4mg /ml Dexamethasone, Manual therapy, and Re-evaluation  PLAN FOR NEXT SESSION: review and progress with home exercise program; working on posture and alignment; manual work, dry needling, modalities as indicated.    [place], PT 10/15/2022, 11:07 AM

## 2022-10-19 ENCOUNTER — Encounter: Payer: Self-pay | Admitting: Rehabilitative and Restorative Service Providers"

## 2022-10-19 ENCOUNTER — Ambulatory Visit: Payer: BC Managed Care – PPO | Admitting: Rehabilitative and Restorative Service Providers"

## 2022-10-19 DIAGNOSIS — M25631 Stiffness of right wrist, not elsewhere classified: Secondary | ICD-10-CM

## 2022-10-19 DIAGNOSIS — M6281 Muscle weakness (generalized): Secondary | ICD-10-CM

## 2022-10-19 DIAGNOSIS — R29898 Other symptoms and signs involving the musculoskeletal system: Secondary | ICD-10-CM

## 2022-10-19 DIAGNOSIS — M79601 Pain in right arm: Secondary | ICD-10-CM

## 2022-10-19 NOTE — Therapy (Addendum)
OUTPATIENT PHYSICAL THERAPY SHOULDER TREATMENT AND DISCHARGE SUMMARY  PHYSICAL THERAPY DISCHARGE SUMMARY  Visits from Start of Care: 8  Current functional level related to goals / functional outcomes: SEE PROGRESS NOTE FOR DISCHARGE SUATUS    Remaining deficits: Unknown    Education / Equipment: HEP   Patient agrees to discharge. Patient goals were met. Patient is being discharged due to meeting the stated rehab goals. [name] P. Helene Kelp PT, MPH 12/09/22 9:15 AM  Patient Name: [name] MRN: [id number] DOB:11-29-1960, 62 y.o., female Today's Date: 10/19/2022  END OF SESSION:  PT End of Session - 10/19/22 1103     Visit Number 8    Number of Visits 16    Date for PT Re-Evaluation 11/18/22    PT Start Time 1100    PT Stop Time 1145    PT Time Calculation (min) 45 min    Activity Tolerance Patient tolerated treatment well             Past Medical History:  Diagnosis Date   Brain aneurysm    COPD (chronic obstructive pulmonary disease) ([place]) 09/05/2014   Per notes from ED.     DDD (degenerative disc disease), lumbar 11/06/2014   Diplopia 09/05/2014   Resolved. Dr. Gwenevere Ghazi.     Former smoker 09/03/2014   Quit date 08/13/14    Hyperlipidemia 09/05/2014   Hypertension    Non-traumatic intracranial subarachnoid hemorrhage ([place])    Pigmentary dispersion syndrome 10/10/2014   Via dr. Gwenevere Ghazi. Given contacts and glasses. Follow up yearly.     Primary osteoarthritis of both hips 11/07/2014   Past Surgical History:  Procedure Laterality Date   BRAIN SURGERY     CARDIAC CATHETERIZATION N/A 07/12/2015   Procedure: Left Heart Cath and Coronary Angiography;  Surgeon: Jettie Booze, MD;  Location: [place] [facility];  Service: Cardiovascular;  Laterality: N/A;   CESAREAN SECTION N/A    IR GENERIC HISTORICAL  10/16/2016   IR ANGIO INTRA EXTRACRAN SEL INTERNAL CAROTID BILAT MOD SED 10/16/2016 Consuella Lose, MD [facility]   IR GENERIC HISTORICAL  10/16/2016   IR  ANGIO VERTEBRAL SEL VERTEBRAL BILAT MOD SED 10/16/2016 Consuella Lose, MD [facility]   KIDNEY SURGERY Left    stent   RADIOLOGY WITH ANESTHESIA N/A 08/14/2014   Procedure: Embolization   (RADIOLOGY WITH ANESTHESIA) ;  Surgeon: Medication Radiologist, MD;  Location: [facility];  Service: Radiology;  Laterality: N/A;   Patient Active Problem List   Diagnosis Date Noted   Distal radius fracture, right 08/12/2022   Nontraumatic subarachnoid hemorrhage, unspecified ([place]) 11/02/2017   Chronic nonintractable headache 11/02/2017   Elevated MCV 12/02/2016   Low blood potassium 12/02/2016   Primary osteoarthritis of both hips 11/07/2014   DDD (degenerative disc disease), lumbar 11/06/2014   Pigmentary dispersion syndrome 10/10/2014   Hyperlipidemia 09/05/2014   Diplopia 09/05/2014   COPD (chronic obstructive pulmonary disease) ([place]) 09/05/2014   Essential (primary) hypertension 09/05/2014   Former smoker 09/03/2014   History of intracranial aneurysm 08/13/2014    PCP: Samuel Bouche, NP  REFERRING PROVIDER: Dr Aundria Mems  REFERRING DIAG: Closed Colles' fracture Rt wrist   THERAPY DIAG:  Arm pain, diffuse, right  Other symptoms and signs involving the musculoskeletal system  Stiffness of right wrist joint  Muscle weakness (generalized)  Rationale for Evaluation and Treatment: Rehabilitation  ONSET DATE: 08/10/22  SUBJECTIVE:  SUBJECTIVE STATEMENT: [name] reports continued progress with Rt wrist and hand. She is not really having pain and is using Rt UE for more activities at home. Making progress with Rt wrist.   PERTINENT HISTORY: Patient fell 08/10/22 onto outstretched arm. She was seen in ED and placed in an immobilizer with xrays showing closed Colles' fracture of Rt radius and ulna styloid. Was  subsequently placed in a cast for 4 weeks with cast d/c'ed 09/21/22. Brain aneurysm ~ 20+ yrs ago; HTN; neck pain remote past    PAIN:  Are you having pain? Yes: NPRS scale: 1/10; intermittently  Pain location: Rt wrist   Pain description: aching  Aggravating factors: using arm; twisting wrist Relieving factors: cold; holding wrist area   PRECAUTIONS: Other: s/p fracture Rt radius and ulna 08/10/22  WEIGHT BEARING RESTRICTIONS: post fracture 08/10/22  FALLS:  Has patient fallen in last 6 months? Yes. 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 Redemonstrated slightly comminuted distal radial fracture with intra-articular extension. Interval callus formation though a fracture line persists. No change in alignment. 2. Presumably old ulnar styloid process fracture. 3. Suspected widening of the scapholunate articulation as could be seen of the sequela of ligamentous injury.  PATIENT SURVEYS:  FOTO 33 goal 83  POSTURE: Patient presents with head forward posture with increased thoracic kyphosis; shoulders rounded and elevated; scapulae abducted and rotated along the thoracic spine; head of the humerus anterior in orientation.   UPPER EXTREMITY ROM:    Discomfort or pain with all Rt wrist motions  Active ROM Right eval Left eval Right 10/07/22 Right  10/19/22  Elbow flexion WNL [facility]  WFL  Elbow extension full full  full  Wrist flexion 37 70 42 60  Wrist extension 38 60 60 66  Wrist ulnar deviation 17 43  34  Wrist radial deviation 25 36  25  Wrist pronation 84 90  87  Wrist supination 76 84  84    UPPER EXTREMITY MMT: 10/19/22:    Bilat shoulder strength 5/5    Lt elbow, wrist WNL's    Right:     Elbow flexion: 5/5 Elbow extension: 5/5   Wrist flexion: 5-/5   Wrist extension: 5/5   Ulnar deviation: 5/5   Radial deviation:  5/5              Right        Left Grip  40 55   Lateral pinch  5 7   Three jaw chuck  5 5   (Blank rows = not tested)  PALPATION:  Tenderness to palpation Rt distal wrist radial and ulnar wrist   EDEMA:  Mild edema dorsum and radial hand/wrist    [facility] Adult PT Treatment:                                                DATE:  10/19/21 Therapeutic Exercise: AROM/PROM wrist flexion/extension with overpressure to tolerance Prayer stretch standing with gentle self mobilization Row green TB 3 sec x 10 x 2  ER green TB 3 sec x  10  IR green TB 3 sec x 10  Shoulder extension blue TB 3 sec x 10  Shoulder extension palms fwd blue TB 3 sec x 10  Shoulder row 45 deg to ER to overhead 3 sec x 10  Carry 10# KB bilat x 5 laps in gym Overhead carry 4# dumbbells x 3 laps  Flex bar theraband - yellow for grip and twist   Manual Therapy: STM wrist flexors/extensions Mobilization of carpals and ulna/radius grade II/III PROM with overpressure into flexion/extension and supination Modalities: Moist heat Rt wrist x 10 min pre treatment Will ice at home  Self Care: Cautioned to avoid overuse of Rt UE and doing too many or being too aggressive with exercises    10/15/21 Therapeutic Exercise: AROM/PROM wrist flexion/extension with overpressure to tolerance Prayer stretch standing with gentle self mobilization Row green TB 3 sec x 10 x 2  ER green TB 3 sec x 10  IR green Tb 3 sec x 10  Stacking cones counter to shoulder height shelf  Velcro board w/various rolls  Peg board one and two layer pegs on and off Bouncing large orange ball on wall overhead 1 min x 3  Wall push up small range 10 x 2  Flex bar theraband - yellow for grip and twist   Manual Therapy: STM wrist flexors/extensions Mobilization of carpals and ulna/radius grade II/III PROM with  overpressure into flexion/extension and supination Modalities: Moist heat Rt wrist x 10 min pre treatment Will ice at home  Self Care: Cautioned to avoid overuse of Rt UE and doing too many or being too aggressive with exercises   PATIENT EDUCATION: Education details: HEP POC Person educated: Patient Education method: Explanation, Demonstration, Tactile cues, Verbal cues, and Handouts Education comprehension: verbalized understanding, returned demonstration, verbal cues required, tactile cues required, and needs further education  Access Code: [email or web address] Access Code: [email or web address] URL: [email or web address] Date: 10/19/2022 Prepared by: Gillermo Murdoch  Program Notes sitting with elbow supported on table elbow padded - bend each joint of each finger to stretch -bend all finger joints into fist -singers straight, rooftop, finger stretch down, fist -bend wrist forward with fingers relaxed-bend wrist forward with light fist -bend wrist back with relaxed fingers-bend wrist back with fingers straight - move wrist to thumb side holding at palm not fingers -move wrist to little finger side holding at palm not fingers -wrist circles CW/CCW with support at forearm retrograde massage finger tips toward heart ice 3-4 times a day, especially after exercises   Exercises - Wrist Prayer Stretch at Table  - 2 x daily - 7 x weekly - 1 sets - 3 reps - 10 sec  hold - Seated Wrist Flexion with Dumbbell  - 1 x daily - 7 x weekly - 1-2 sets - 10 reps - 2 sec  hold - Seated Wrist Extension with Dumbbell  - 1 x daily - 7 x weekly - 1-2 sets - 10 reps - 3 sec  hold - Seated Wrist Radial Deviation with Dumbbell  - 1 x daily - 7 x weekly - 1 sets - 10 reps - 3 sec  hold - Seated Wrist Ulnar Deviation with Dumbbell  - 1 x daily - 7 x weekly - 1 sets - 10 reps - 3 sec  hold - Seated Single Arm Bicep Curls with Rotation and Dumbbell  - 1 x daily - 7 x weekly - 1 sets - 10 reps - 3 sec  hold - Seated Pronation  Supination with  Dumbbell  - 1 x daily - 7 x weekly - 1 sets - 10 reps - 3 sec  hold - Forearm Pronation with Dumbbell  - 1 x daily - 7 x weekly - 1 sets - 10 reps - 3 sec  hold - Standing Bilateral Low Shoulder Row with Anchored Resistance  - 2 x daily - 7 x weekly - 1-3 sets - 10 reps - 2-3 sec  hold - Shoulder External Rotation with Anchored Resistance  - 2 x daily - 7 x weekly - 1-2 sets - 10 reps - 3 sec  hold - Shoulder Internal Rotation with Resistance  - 2 x daily - 7 x weekly - 2 sets - 10 reps - 3 sec  hold - Shoulder extension with resistance - Neutral  - 1 x daily - 7 x weekly - 1-3 sets - 10 reps - 3 sec  hold - Shoulder Extension with Resistance - Palms Forward  - 1 x daily - 7 x weekly - 1-3 sets - 10 reps - 3 sec  hold - Farmer's Carry with Kettlebells  - Geophysical data processor with Kettlebells  - Standing Shoulder External Rotation with Resistance at 45 Degrees of Abduction  - 1 x daily - 7 x weekly - 1-2 sets - 10 reps - 3 sec  hold   ASSESSMENT:  CLINICAL IMPRESSION: Patient notes increased functional use of the R UE and decreasing pain. She is performing HEP with weights to tolerance.working to progress loading, strengthening and functional activities as tolerated. Patient demonstrates significant increase in strength and ROM Rt UE including elbow, forearm, wrist and hand. Patient has decreased grip and pinch strength Rt compared to LE but is functional and she has exercises and activities to continue work on strengthening for home. RTD 11/09/22.   OBJECTIVE IMPAIRMENTS: decreased mobility, decreased ROM, decreased strength, increased fascial restrictions, impaired flexibility, impaired UE functional use, and pain.   GOALS: Goals reviewed with patient? Yes  SHORT TERM GOALS: Target date: 10/21/2022  Independent in initial HEP  Baseline: Goal status: MET on 10/07/22  2.  Patient reports using Rt UE for functional activities such as dressing, bathing, etc... Baseline:   Goal status: MET  10/07/22 reporting use of R UE with brushing hair, etc.   LONG TERM GOALS: Target date: 11/18/2022  Increase AROM Rt forearm and wrist to within 3-5 degrees of AROM Lt forearm and wrist  Baseline:  Goal status: IN PROGRESS  2.  Pain free ROM Rt forearm and wrist  Baseline:  Goal status:IN PROGRESS  3.  Increased strength Rt forearm; wrist; hand to 4+/5 to 5/5  Baseline:  Goal status: IN PROGRESS  4.  Patient demonstrates and reports return to normal functional use of Rt UE  Baseline:  Goal status: IN PROGRESS  5.  Independent in HEP  Baseline:  Goal status: IN PROGRESS  6.  Improve functional limitation score to 57 Baseline: 33 Goal status: IN PROGRESS  PLAN:  PT FREQUENCY: 2x/week  PT DURATION: 8 weeks  PLANNED INTERVENTIONS: Therapeutic exercises, Therapeutic activity, Neuromuscular re-education, Patient/Family education, Self Care, Joint mobilization, Aquatic Therapy, Dry Needling, Electrical stimulation, Cryotherapy, Moist heat, Taping, Ultrasound, Ionotophoresis 4mg /ml Dexamethasone, Manual therapy, and Re-evaluation  PLAN FOR NEXT SESSION: review and progress with home exercise program; working on posture and alignment; manual work, dry needling, modalities as indicated.    Everardo All, PT 10/19/2022, 11:56 AM

## 2022-10-22 ENCOUNTER — Ambulatory Visit: Payer: BC Managed Care – PPO | Admitting: Rehabilitative and Restorative Service Providers"

## 2022-10-26 ENCOUNTER — Ambulatory Visit: Payer: BC Managed Care – PPO | Admitting: Sports Medicine

## 2022-11-09 ENCOUNTER — Encounter: Payer: Self-pay | Admitting: Sports Medicine

## 2022-11-09 ENCOUNTER — Ambulatory Visit (INDEPENDENT_AMBULATORY_CARE_PROVIDER_SITE_OTHER): Payer: BC Managed Care – PPO | Admitting: Sports Medicine

## 2022-11-09 VITALS — BP 119/69 | HR 102 | Wt 118.0 lb

## 2022-11-09 DIAGNOSIS — S52531G Colles' fracture of right radius, subsequent encounter for closed fracture with delayed healing: Secondary | ICD-10-CM

## 2022-11-09 DIAGNOSIS — I10 Essential (primary) hypertension: Secondary | ICD-10-CM

## 2022-11-09 MED ORDER — HYDROCHLOROTHIAZIDE 25 MG PO TABS: 25.0000 mg | ORAL_TABLET | Freq: Every day | ORAL | 3 refills | Status: DC

## 2022-11-09 MED ORDER — OMEPRAZOLE 40 MG PO CPDR: 40.0000 mg | DELAYED_RELEASE_CAPSULE | Freq: Every day | ORAL | 3 refills | Status: DC

## 2022-11-09 MED ORDER — LOSARTAN POTASSIUM 100 MG PO TABS: 100.0000 mg | ORAL_TABLET | Freq: Every day | ORAL | 3 refills | Status: DC

## 2022-11-09 NOTE — Assessment & Plan Note (Signed)
[name] is doing really well, her distal radius fracture is healing well, she has no pain, good motion, good strength, still a bit swollen which can persist for several months, we went ahead and wrote her return to work note today. She did need refills on her hydrochlorothiazide, losartan, omeprazole, she agrees to get in with her PCP for a physical this summer.

## 2022-11-09 NOTE — Progress Notes (Signed)
    Procedures performed today:    None.  Independent interpretation of notes and tests performed by another provider:   None.  Brief History, Exam, Impression, and Recommendations:    Distal radius fracture, right Leihla is doing really well, her distal radius fracture is healing well, she has no pain, good motion, good strength, still a bit swollen which can persist for several months, we went ahead and wrote her return to work note today. She did need refills on her hydrochlorothiazide, losartan, omeprazole, she agrees to get in with her PCP for a physical this summer.    ____________________________________________ Gwen Her. Dianah Field, M.D., ABFM., CAQSM., AME. Primary Care and Sports Medicine [facility]  Adjunct Professor of [employer]  Risk manager

## 2023-01-14 ENCOUNTER — Encounter: Payer: BC Managed Care – PPO | Admitting: Medical-Surgical

## 2023-01-14 NOTE — Progress Notes (Signed)
Erroneous encounter. Please disregard.

## 2023-01-22 ENCOUNTER — Encounter: Payer: Self-pay | Admitting: Medical-Surgical

## 2023-01-22 ENCOUNTER — Ambulatory Visit (INDEPENDENT_AMBULATORY_CARE_PROVIDER_SITE_OTHER): Payer: BC Managed Care – PPO | Admitting: Medical-Surgical

## 2023-01-22 ENCOUNTER — Ambulatory Visit (INDEPENDENT_AMBULATORY_CARE_PROVIDER_SITE_OTHER): Payer: BC Managed Care – PPO

## 2023-01-22 VITALS — BP 119/67 | HR 69 | Resp 20 | Ht 63.0 in | Wt 114.4 lb

## 2023-01-22 DIAGNOSIS — M79642 Pain in left hand: Secondary | ICD-10-CM

## 2023-01-22 DIAGNOSIS — M7989 Other specified soft tissue disorders: Secondary | ICD-10-CM | POA: Diagnosis not present

## 2023-01-22 NOTE — Progress Notes (Signed)
        Established patient visit  History, exam, impression, and plan:  1. Left hand pain Pleasant 62 year old female presenting with complaints of approximately 3 weeks of left hand middle knuckle swelling and discomfort.  Denies outright pain but feels tight in the area surrounding the MCP joint at the third finger.  No recent injury, trauma, or procedures to the area.  On exam, the left middle knuckle has surrounding edema with mild erythema.  No tenderness to palpation or palpable bony abnormality.  Since she is not in pain, does not desire any pain medications today.  Would like further investigation so ordering left hand x-rays.  Reviewed recommendations for management with heat, ice, Tylenol, etc. if needed. - DG Hand Complete Left; Future  Procedures performed this visit: None.  Return if symptoms worsen or fail to improve.  __________________________________ Thayer Ohm, DNP, APRN, FNP-BC Primary Care and Sports Medicine [facility]

## 2023-04-15 ENCOUNTER — Encounter: Payer: Self-pay | Admitting: Medical-Surgical

## 2023-04-21 ENCOUNTER — Encounter: Payer: Self-pay | Admitting: Medical-Surgical

## 2023-05-25 ENCOUNTER — Encounter: Payer: Self-pay | Admitting: Family Medicine

## 2023-05-25 ENCOUNTER — Telehealth: Payer: Self-pay | Admitting: Medical-Surgical

## 2023-05-25 ENCOUNTER — Ambulatory Visit (INDEPENDENT_AMBULATORY_CARE_PROVIDER_SITE_OTHER): Payer: BC Managed Care – PPO | Admitting: Family Medicine

## 2023-05-25 VITALS — BP 154/67 | HR 84 | Temp 97.8°F | Ht 63.0 in | Wt 112.4 lb

## 2023-05-25 DIAGNOSIS — R519 Headache, unspecified: Secondary | ICD-10-CM | POA: Diagnosis not present

## 2023-05-25 DIAGNOSIS — O864 Pyrexia of unknown origin following delivery: Secondary | ICD-10-CM

## 2023-05-25 DIAGNOSIS — U071 COVID-19: Secondary | ICD-10-CM | POA: Insufficient documentation

## 2023-05-25 DIAGNOSIS — Z20822 Contact with and (suspected) exposure to covid-19: Secondary | ICD-10-CM

## 2023-05-25 DIAGNOSIS — R509 Fever, unspecified: Secondary | ICD-10-CM

## 2023-05-25 DIAGNOSIS — R051 Acute cough: Secondary | ICD-10-CM | POA: Diagnosis not present

## 2023-05-25 LAB — POCT INFLUENZA A/B
Influenza A, POC: NEGATIVE
Influenza B, POC: NEGATIVE

## 2023-05-25 LAB — POC COVID19 BINAXNOW: SARS Coronavirus 2 Ag: POSITIVE — AB

## 2023-05-25 MED ORDER — NIRMATRELVIR/RITONAVIR (PAXLOVID)TABLET: 3.0000 | ORAL_TABLET | Freq: Two times a day (BID) | ORAL | 0 refills | Status: AC

## 2023-05-25 NOTE — Progress Notes (Signed)
[name] - 62 y.o. female MRN [id number]  Date of birth: 1961/01/12  Subjective Chief Complaint  Patient presents with   Covid Exposure    HPI [name] is a 62 y.o. female here today with complaint of COVID symptoms.  She reports cough, fever and headache.  Symptoms started about 2 days ago.  COVID exposure at work.   She has co-morbidity of COPD and HTN.  She is not having difficulty breathing or increased wheezing.  Using tylenol as needed currently.   ROS:  A comprehensive ROS was completed and negative except as noted per HPI  Allergies  Allergen Reactions   Aspirin Hives   Ibuprofen Hives   Iodine Hives   Latex Rash   Oysters [Shellfish Allergy] Hives and Nausea And Vomiting    Reaction to oysters, clams and crab legs    Past Medical History:  Diagnosis Date   Brain aneurysm    COPD (chronic obstructive pulmonary disease) ([facility]) 09/05/2014   Per notes from ED.     DDD (degenerative disc disease), lumbar 11/06/2014   Diplopia 09/05/2014   Resolved. Dr. Doristine Section.     Former smoker 09/03/2014   Quit date 08/13/14    Hyperlipidemia 09/05/2014   Hypertension    Non-traumatic intracranial subarachnoid hemorrhage ([facility])    Pigmentary dispersion syndrome 10/10/2014   Via dr. Doristine Section. Given contacts and glasses. Follow up yearly.     Primary osteoarthritis of both hips 11/07/2014    Past Surgical History:  Procedure Laterality Date   BRAIN SURGERY     CARDIAC CATHETERIZATION N/A 07/12/2015   Procedure: Left Heart Cath and Coronary Angiography;  Surgeon: Corky Crafts, MD;  Location: [facility];  Service: Cardiovascular;  Laterality: N/A;   CESAREAN SECTION N/A    IR GENERIC HISTORICAL  10/16/2016   IR ANGIO INTRA EXTRACRAN SEL INTERNAL CAROTID BILAT MOD SED 10/16/2016 [name], MD [facility]   IR GENERIC HISTORICAL  10/16/2016   IR ANGIO VERTEBRAL SEL VERTEBRAL BILAT MOD SED 10/16/2016 [name], MD [facility]   KIDNEY SURGERY  Left    stent   RADIOLOGY WITH ANESTHESIA N/A 08/14/2014   Procedure: Embolization   (RADIOLOGY WITH ANESTHESIA) ;  Surgeon: Medication Radiologist, MD;  Location: [facility];  Service: Radiology;  Laterality: N/A;    Social History   Socioeconomic History   Marital status: Single    Spouse name: Not on file   Number of children: 2   Years of education: Not on file   Highest education level: Not on file  Occupational History   Not on file  Tobacco Use   Smoking status: Every Day    Types: E-cigarettes   Smokeless tobacco: Never  Vaping Use   Vaping status: Every Day  Substance and Sexual Activity   Alcohol use: Yes    Alcohol/week: 4.0 - 8.0 standard drinks of alcohol    Types: 4 - 8 Standard drinks or equivalent per week   Drug use: No   Sexual activity: Not Currently    Birth control/protection: Post-menopausal  Other Topics Concern   Not on file  Social History Narrative   Not on file   Social Determinants of Health   Financial Resource Strain: Not on file  Food Insecurity: No Food Insecurity (02/11/2022)   [facility]   Hunger Vital Sign    Worried About Running Out of Food in the Last Year: Never true    Ran Out of  Food in the Last Year: Never true  Transportation Needs: Not on file  Physical Activity: Not on file  Stress: Not on file  Social Connections: Unknown (02/10/2022)   [facility] from [facility]   Social Network    Social Network: Not on file    Family History  Problem Relation Age of Onset   Alcohol abuse Mother    Breast cancer Mother    Hyperlipidemia Mother    Hypertension Mother    Alcohol abuse Father    Stroke Father    Cerebral aneurysm Father        RUPTURE   Hyperlipidemia Sister    Hypertension Sister    Aneurysm Sister    Aneurysm Sister     Health Maintenance  Topic Date Due   HIV Screening  Never done   Fecal DNA (Cologuard)  Never done   Lung Cancer Screening  Never done   Zoster  Vaccines- Shingrix (1 of 2) Never done   Cervical Cancer Screening (HPV/Pap Cotest)  01/12/2020   INFLUENZA VACCINE  04/08/2023   COVID-19 Vaccine (4 - 2023-24 season) 05/09/2023   MAMMOGRAM  01/22/2024 (Originally 12/31/2018)   DTaP/Tdap/Td (2 - Td or Tdap) 11/10/2025   Hepatitis C Screening  Completed   HPV VACCINES  Aged Out   Colonoscopy  Discontinued     ----------------------------------------------------------------------------------------------------------------------------------------------------------------------------------------------------------------- Physical Exam BP (!) 154/67 (BP Location: Left Arm, Patient Position: Sitting, Cuff Size: Small)   Pulse 84   Temp 97.8 F (36.6 C) (Oral)   Ht 5\' 3"  (1.6 m)   Wt 112 lb 6.4 oz (51 kg)   SpO2 100%   BMI 19.91 kg/m   Physical Exam Constitutional:      Appearance: Normal appearance.  Eyes:     General: No scleral icterus. Cardiovascular:     Rate and Rhythm: Normal rate and regular rhythm.  Pulmonary:     Effort: Pulmonary effort is normal.     Breath sounds: Normal breath sounds.  Neurological:     General: No focal deficit present.     Mental Status: She is alert.  Psychiatric:        Mood and Affect: Mood normal.        Behavior: Behavior normal.     ------------------------------------------------------------------------------------------------------------------------------------------------------------------------------------------------------------------- Assessment and Plan  COVID Recommend continued supportive care.  Adding course of Paxlovid with history of COPD.  Instructed to contact clinic if having increasing respiratory symptoms or shortness of breath.  Discussed red flags and reasons to seek emergency care.    Meds ordered this encounter  Medications   nirmatrelvir/ritonavir (PAXLOVID) 20 x 150 MG & 10 x 100MG  TABS    Sig: Take 3 tablets by mouth 2 (two) times daily for 5 days. (Take  nirmatrelvir 150 mg two tablets twice daily for 5 days and ritonavir 100 mg one tablet twice daily for 5 days) Patient GFR is 90    Dispense:  30 tablet    Refill:  0    No follow-ups on file.    This visit occurred during the SARS-CoV-2 public health emergency.  Safety protocols were in place, including screening questions prior to the visit, additional usage of staff PPE, and extensive cleaning of exam room while observing appropriate contact time as indicated for disinfecting solutions.

## 2023-05-25 NOTE — Patient Instructions (Signed)

## 2023-05-25 NOTE — Telephone Encounter (Signed)
Patient called requesting a work note for the next 5 days due to COVID. She requested that it be faxed to her job at [phone] and a copy made for her to pick up.

## 2023-05-25 NOTE — Assessment & Plan Note (Signed)
Recommend continued supportive care.  Adding course of Paxlovid with history of COPD.  Instructed to contact clinic if having increasing respiratory symptoms or shortness of breath.  Discussed red flags and reasons to seek emergency care.

## 2023-05-26 NOTE — Telephone Encounter (Signed)
Previous phone note had incorrect fax number .Patient called back to request work note for the next five days due to being out of work with COVID she requested that it be faxed to her job at [phone] and a copy made in my chart

## 2023-05-27 ENCOUNTER — Encounter: Payer: Self-pay | Admitting: Medical-Surgical

## 2023-05-27 NOTE — Telephone Encounter (Signed)
Letter printed and given to [facility]

## 2023-05-27 NOTE — Telephone Encounter (Signed)
Pt called.  She is following up on work note.

## 2023-05-28 ENCOUNTER — Ambulatory Visit: Payer: BC Managed Care – PPO | Admitting: Family Medicine

## 2023-05-28 ENCOUNTER — Telehealth: Payer: Self-pay

## 2023-05-28 ENCOUNTER — Encounter: Payer: Self-pay | Admitting: Family Medicine

## 2023-05-28 NOTE — Telephone Encounter (Signed)
LVM for patient. Verifying purpose of appt. If COVID retesting is required for employment appt should be changed to [place].

## 2023-05-28 NOTE — Telephone Encounter (Signed)
Pt requesting work note date be extended to Tuesday, 9/24 due to mandatory employment retesting. Pt scheduled for [place] re-test on Monday.

## 2023-05-28 NOTE — Telephone Encounter (Signed)
Letter sent via mychart. Copy can be printed and faxed if needed.

## 2023-05-31 ENCOUNTER — Ambulatory Visit (INDEPENDENT_AMBULATORY_CARE_PROVIDER_SITE_OTHER): Payer: BC Managed Care – PPO

## 2023-05-31 ENCOUNTER — Ambulatory Visit: Payer: BC Managed Care – PPO

## 2023-05-31 VITALS — BP 117/60 | HR 75 | Ht 63.0 in

## 2023-05-31 DIAGNOSIS — Z20822 Contact with and (suspected) exposure to covid-19: Secondary | ICD-10-CM | POA: Insufficient documentation

## 2023-05-31 LAB — POC COVID19 BINAXNOW: SARS Coronavirus 2 Ag: NEGATIVE

## 2023-05-31 NOTE — Progress Notes (Signed)
Established Patient Office Visit  Subjective   Patient ID: [name], female    DOB: 1961/05/05  Age: 62 y.o. MRN: [id number]  Chief Complaint  Patient presents with   covid test    Positive test with Dr. Ashley Royalty on 05/25/2023. In office today for retest to be sure that has cleared. Will need a note for work if testing is negative.     HPI  Covid retest to verify covid has cleared. Patient was diagnosed on 05/25/2023 with Dr. Ashley Royalty . Out of work from Tuesday 05/25/2023 until today 05/31/2023.   ROS    Objective:     BP 117/60   Pulse 75   Ht 5\' 3"  (1.6 m)   SpO2 100%   BMI 19.91 kg/m    Physical Exam   Results for orders placed or performed in visit on 05/31/23  POC COVID-19  Result Value Ref Range   SARS Coronavirus 2 Ag Negative Negative      The ASCVD Risk score (Arnett DK, et al., 2019) failed to calculate for the following reasons:   The patient has a prior MI or stroke diagnosis    Assessment & Plan:  COVID retest results =negative. Patient given letter for work.  Problem List Items Addressed This Visit       Other   Encounter for laboratory testing for COVID-19 virus - Primary   Relevant Orders   POC COVID-19 (Completed)    Return if symptoms worsen or fail to improve.    Elizabeth Palau, LPN

## 2023-08-29 DIAGNOSIS — Z79899 Other long term (current) drug therapy: Secondary | ICD-10-CM | POA: Diagnosis not present

## 2023-08-29 DIAGNOSIS — Z91040 Latex allergy status: Secondary | ICD-10-CM | POA: Diagnosis not present

## 2023-08-29 DIAGNOSIS — Z886 Allergy status to analgesic agent status: Secondary | ICD-10-CM | POA: Diagnosis not present

## 2023-08-29 DIAGNOSIS — F17210 Nicotine dependence, cigarettes, uncomplicated: Secondary | ICD-10-CM | POA: Diagnosis not present

## 2023-08-29 DIAGNOSIS — I10 Essential (primary) hypertension: Secondary | ICD-10-CM | POA: Diagnosis not present

## 2023-08-29 DIAGNOSIS — Z888 Allergy status to other drugs, medicaments and biological substances status: Secondary | ICD-10-CM | POA: Diagnosis not present

## 2023-08-29 DIAGNOSIS — Z91013 Allergy to seafood: Secondary | ICD-10-CM | POA: Diagnosis not present

## 2023-08-29 DIAGNOSIS — R519 Headache, unspecified: Secondary | ICD-10-CM | POA: Diagnosis not present

## 2023-08-29 DIAGNOSIS — J101 Influenza due to other identified influenza virus with other respiratory manifestations: Secondary | ICD-10-CM | POA: Diagnosis not present

## 2023-08-29 DIAGNOSIS — R509 Fever, unspecified: Secondary | ICD-10-CM | POA: Diagnosis not present

## 2023-08-29 DIAGNOSIS — R059 Cough, unspecified: Secondary | ICD-10-CM | POA: Diagnosis not present

## 2023-12-02 ENCOUNTER — Ambulatory Visit: Admitting: Medical-Surgical

## 2023-12-02 ENCOUNTER — Encounter: Payer: Self-pay | Admitting: Medical-Surgical

## 2023-12-02 VITALS — BP 113/63 | HR 73 | Resp 20 | Ht 63.0 in | Wt 113.1 lb

## 2023-12-02 DIAGNOSIS — Z9109 Other allergy status, other than to drugs and biological substances: Secondary | ICD-10-CM | POA: Diagnosis not present

## 2023-12-02 DIAGNOSIS — Z23 Encounter for immunization: Secondary | ICD-10-CM

## 2023-12-02 MED ORDER — MONTELUKAST SODIUM 10 MG PO TABS: 10.0000 mg | ORAL_TABLET | Freq: Every day | ORAL | 3 refills | Status: DC

## 2023-12-02 NOTE — Progress Notes (Signed)
        Established patient visit  History, exam, impression, and plan:  1. Multiple environmental allergies (Primary) Very pleasant 63 year old female presenting today with a very long history of severe allergies.  She has been taking hydroxyzine as well as over-the-counter sinus/cold/flu medications for management however she is still suffering from significant sinus congestion, sinus headache, nasal congestion, and ear pressure.  Notes that she previously lived in other states but [place] has been the worst on her allergies so far.  She heard from a friend of hers that she could get a shot that would take care of all of the allergies.  She is interested in this today.  Reviewed options for management.  Recommend to be on a daily oral antihistamine as well as Flonase.  Consider adding Pepcid 40 mg daily to tackle antihistamine via GI tract.  Also consider adding Singulair 10 mg nightly.  Discussed use of prednisone but she is not interested in this.  Unclear what shot she is speaking of.  We did review the option to give a Depo-Medrol shot in the office to help with immediate symptoms but she declined.  Referring to allergy for further evaluation and management. - Ambulatory referral to Allergy  2. Immunization due Shingrix No. 1 given in office today.  She will we do for her next Shingrix in 2-6 months.  Okay to schedule as a nurse visit. - Zoster Recombinant (Shingrix )  Procedures performed this visit: None.  Return for nurse visit for Shingrix #2  in 2-6 months.  __________________________________ Thayer Ohm, DNP, APRN, FNP-BC Primary Care and Sports Medicine [facility] [place]

## 2023-12-17 ENCOUNTER — Encounter: Payer: Self-pay | Admitting: Medical-Surgical

## 2023-12-17 ENCOUNTER — Ambulatory Visit (INDEPENDENT_AMBULATORY_CARE_PROVIDER_SITE_OTHER): Admitting: Medical-Surgical

## 2023-12-17 VITALS — BP 130/65 | HR 71 | Resp 20 | Ht 63.0 in | Wt 113.0 lb

## 2023-12-17 DIAGNOSIS — M79622 Pain in left upper arm: Secondary | ICD-10-CM

## 2023-12-17 MED ORDER — MONTELUKAST SODIUM 10 MG PO TABS: 10.0000 mg | ORAL_TABLET | Freq: Every day | ORAL | 3 refills | Status: AC

## 2023-12-17 NOTE — Patient Instructions (Signed)
 Dr. Edwyna Ready Address: [address], [place], [place] [address] Phone: [phone]

## 2023-12-17 NOTE — Progress Notes (Signed)
        Established patient visit  History, exam, impression, and plan:  1. Left axillary pain (Primary) Very pleasant 63 year old female presenting today with reports of left axillary pain and swelling that started last weekend.  Reports that her mom suffered from breast cancer that started in the axillary area and has been very worried about this.  Notes that it is uncomfortable rather than feeling straight pain.  The area feels "fat" but she has not found any discrete lumps.  Tried using a heating pad but this was no help and left her arm numb for a while.  She has put some Aspercreme on the area which helped a little.  No erythema noted.  On exam, no discrete lumps or bumps noted in the area of concern.  Tenderness on palpation along the upper bra line leading into the breast.  Unclear etiology.  No recent fevers, chills, illnesses, etc.  Plan for investigation with a diagnostic bilateral mammogram as well as left axillary ultrasound.  Okay to trial heat/ice, topical analgesics, and Tylenol/ibuprofen as needed for discomfort. - MM 3D DIAGNOSTIC MAMMOGRAM BILATERAL BREAST; Future - US  LIMITED ULTRASOUND INCLUDING AXILLA LEFT BREAST ; Future  Procedures performed this visit: None.  Return if symptoms worsen or fail to improve.  __________________________________ Maryl Snook, DNP, APRN, FNP-BC Primary Care and Sports Medicine [facility] [place]

## 2023-12-20 ENCOUNTER — Encounter: Payer: Self-pay | Admitting: Medical-Surgical

## 2024-01-12 ENCOUNTER — Encounter: Payer: Self-pay | Admitting: Medical-Surgical

## 2024-01-12 ENCOUNTER — Ambulatory Visit
Admission: RE | Admit: 2024-01-12 | Discharge: 2024-01-12 | Disposition: A | Discharge status: Home / Self Care | Source: Ambulatory Visit | Attending: Medical-Surgical | Admitting: Medical-Surgical

## 2024-01-12 DIAGNOSIS — M79622 Pain in left upper arm: Secondary | ICD-10-CM

## 2024-01-27 ENCOUNTER — Ambulatory Visit

## 2024-02-10 ENCOUNTER — Other Ambulatory Visit: Payer: Self-pay | Admitting: Medical-Surgical

## 2024-02-10 DIAGNOSIS — I10 Essential (primary) hypertension: Secondary | ICD-10-CM

## 2024-02-10 NOTE — Telephone Encounter (Unsigned)
 Copied from CRM [id number]. Topic: Clinical - Medication Refill >> Feb 10, 2024  3:52 PM Karole Pacer C wrote: Medication:  losartan  (COZAAR ) 100 MG tablet omeprazole  (PRILOSEC) 40 MG capsule   Has the patient contacted their pharmacy? Yes (Agent: If no, request that the patient contact the pharmacy for the refill. If patient does not wish to contact the pharmacy document the reason why and proceed with request.) (Agent: If yes, when and what did the pharmacy advise?)  This is the patient's preferred pharmacy:  [facility] [phone] - [place], [place] - [address] [place] [address] Phone: [phone] Fax: [phone]  Is this the correct pharmacy for this prescription? Yes If no, delete pharmacy and type the correct one.   Has the prescription been filled recently? No  Is the patient out of the medication? Yes  Has the patient been seen for an appointment in the last year OR does the patient have an upcoming appointment? No  Can we respond through MyChart? Yes  Agent: Please be advised that Rx refills may take up to 3 business days. We ask that you follow-up with your pharmacy.

## 2024-02-11 ENCOUNTER — Other Ambulatory Visit: Payer: Self-pay | Admitting: Medical-Surgical

## 2024-02-11 DIAGNOSIS — I10 Essential (primary) hypertension: Secondary | ICD-10-CM

## 2024-02-11 MED ORDER — LOSARTAN POTASSIUM 100 MG PO TABS: 100.0000 mg | ORAL_TABLET | Freq: Every day | ORAL | 0 refills | Status: DC

## 2024-02-11 MED ORDER — HYDROCHLOROTHIAZIDE 25 MG PO TABS: 25.0000 mg | ORAL_TABLET | Freq: Every day | ORAL | 0 refills | Status: DC

## 2024-02-11 MED ORDER — OMEPRAZOLE 40 MG PO CPDR: 40.0000 mg | DELAYED_RELEASE_CAPSULE | Freq: Every day | ORAL | 0 refills | Status: DC

## 2024-02-11 NOTE — Telephone Encounter (Signed)
 Copied from CRM [id number]. Topic: Clinical - Medication Refill >> Feb 11, 2024  4:12 PM Adrianna P wrote: Medication: hydrochlorothiazide  (HYDRODIURIL ) 25 MG tablet  Has the patient contacted their pharmacy? No (Agent: If no, request that the patient contact the pharmacy for the refill. If patient does not wish to contact the pharmacy document the reason why and proceed with request.) (Agent: If yes, when and what did the pharmacy advise?)  This is the patient's preferred pharmacy:  [facility] [phone] - [place], [place] - [address] [place] [address] Phone: [phone] Fax: [phone]  Is this the correct pharmacy for this prescription? Yes If no, delete pharmacy and type the correct one.   Has the prescription been filled recently? No  Is the patient out of the medication? Yes  Has the patient been seen for an appointment in the last year OR does the patient have an upcoming appointment? Yes  Can we respond through MyChart? No  Agent: Please be advised that Rx refills may take up to 3 business days. We ask that you follow-up with your pharmacy.

## 2024-05-09 ENCOUNTER — Encounter: Payer: Self-pay | Admitting: Sports Medicine

## 2024-05-10 ENCOUNTER — Other Ambulatory Visit: Payer: Self-pay | Admitting: Medical-Surgical

## 2024-05-10 DIAGNOSIS — I10 Essential (primary) hypertension: Secondary | ICD-10-CM

## 2024-06-04 ENCOUNTER — Other Ambulatory Visit: Payer: Self-pay | Admitting: Medical-Surgical

## 2024-06-04 DIAGNOSIS — I10 Essential (primary) hypertension: Secondary | ICD-10-CM

## 2024-06-25 ENCOUNTER — Other Ambulatory Visit: Payer: Self-pay | Admitting: Medical-Surgical

## 2024-06-25 DIAGNOSIS — I10 Essential (primary) hypertension: Secondary | ICD-10-CM

## 2024-06-26 ENCOUNTER — Other Ambulatory Visit: Payer: Self-pay | Admitting: Medical-Surgical

## 2024-06-26 DIAGNOSIS — I10 Essential (primary) hypertension: Secondary | ICD-10-CM

## 2024-07-01 ENCOUNTER — Other Ambulatory Visit: Payer: Self-pay | Admitting: Medical-Surgical

## 2024-07-06 ENCOUNTER — Ambulatory Visit: Admitting: Medical-Surgical

## 2024-07-06 ENCOUNTER — Encounter: Payer: Self-pay | Admitting: Medical-Surgical

## 2024-07-06 VITALS — BP 131/63 | HR 73 | Resp 20 | Ht 63.0 in | Wt 117.0 lb

## 2024-07-06 DIAGNOSIS — Z23 Encounter for immunization: Secondary | ICD-10-CM

## 2024-07-06 DIAGNOSIS — I10 Essential (primary) hypertension: Secondary | ICD-10-CM

## 2024-07-06 DIAGNOSIS — K219 Gastro-esophageal reflux disease without esophagitis: Secondary | ICD-10-CM

## 2024-07-06 MED ORDER — OMEPRAZOLE 40 MG PO CPDR: 40.0000 mg | DELAYED_RELEASE_CAPSULE | Freq: Every day | ORAL | 3 refills | Status: AC

## 2024-07-06 MED ORDER — LOSARTAN POTASSIUM 100 MG PO TABS: 100.0000 mg | ORAL_TABLET | Freq: Every day | ORAL | 3 refills | Status: AC

## 2024-07-06 MED ORDER — HYDROCHLOROTHIAZIDE 25 MG PO TABS: 25.0000 mg | ORAL_TABLET | Freq: Every day | ORAL | 3 refills | Status: AC

## 2024-07-06 NOTE — Progress Notes (Signed)
        Established patient visit   History of Present Illness   Discussed the use of AI scribe software for clinical note transcription with the patient, who gave verbal consent to proceed.  History of Present Illness   [name] is a 63 year old female who presents for a shingles vaccine and medication refills.  Immunization status - Presenting for second dose of shingles vaccine - Undecided about receiving pneumonia vaccine due to concerns about the number of injections  Antihypertensive therapy - Blood pressure measured at 131/64 mmHg, considered borderline - Off antihypertensive medication for a few days - Currently taking hydrochlorothiazide  25mg  and losartan  100mg  daily  Gastroesophageal reflux symptoms - Takes omeprazole   40mg  daily for reflux symptoms, effective and well controlled  Physical Exam   Physical Exam Vitals reviewed.  Constitutional:      General: She is not in acute distress.    Appearance: Normal appearance. She is not ill-appearing.  HENT:     Head: Normocephalic and atraumatic.  Cardiovascular:     Rate and Rhythm: Normal rate and regular rhythm.     Pulses: Normal pulses.     Heart sounds: Normal heart sounds. No murmur heard.    No friction rub. No gallop.  Pulmonary:     Effort: Pulmonary effort is normal. No respiratory distress.     Breath sounds: Normal breath sounds. No wheezing.  Skin:    General: Skin is warm and dry.  Neurological:     Mental Status: She is alert and oriented to person, place, and time.  Psychiatric:        Mood and Affect: Mood normal.        Behavior: Behavior normal.        Thought Content: Thought content normal.        Judgment: Judgment normal.    Assessment & Plan   Essential hypertension Blood pressure borderline at 131/64 mmHg. Off antihypertensive medication for a few days. - Recheck of BP 131/63. - Restart hydrochlorothiazide  25mg  and losartan  100mg  daily. - Monitor BP at home with a goal of  130/80 or less.  Gastroesophageal reflux disease Taking omeprazole  with good relief of symptoms and no side effects/intolerances. - Continue omeprazole  40mg  daily.   General Health Maintenance Due for second shingles and pneumonia vaccines. Declined bone density scan. Lung cancer screening discussed but not pursued. - Administer second shingles vaccine. - Offer pneumonia vaccine- declined. - Decline bone density scan. - Discuss lung cancer screening criteria- declined.    Follow up   Return in about 6 months (around 01/04/2025) for HTN follow up. __________________________________ Zada FREDRIK Palin, DNP, APRN, FNP-BC Primary Care and Sports Medicine [facility] [place]
